# Patient Record
Sex: Male | Born: 1950 | Race: White | Hispanic: No | Marital: Single | Smoking: Never smoker
Health system: Southern US, Community
[De-identification: ages and names within clinical notes are randomized; demographics above are authoritative.]

## PROBLEM LIST (undated history)

## (undated) DIAGNOSIS — Z9289 Personal history of other medical treatment: Secondary | ICD-10-CM

## (undated) DIAGNOSIS — I00 Rheumatic fever without heart involvement: Secondary | ICD-10-CM

## (undated) DIAGNOSIS — Z9861 Coronary angioplasty status: Secondary | ICD-10-CM

## (undated) DIAGNOSIS — H9319 Tinnitus, unspecified ear: Secondary | ICD-10-CM

## (undated) DIAGNOSIS — Z720 Tobacco use: Secondary | ICD-10-CM

## (undated) DIAGNOSIS — N200 Calculus of kidney: Secondary | ICD-10-CM

## (undated) DIAGNOSIS — I2510 Atherosclerotic heart disease of native coronary artery without angina pectoris: Secondary | ICD-10-CM

## (undated) DIAGNOSIS — Z2821 Immunization not carried out because of patient refusal: Secondary | ICD-10-CM

## (undated) DIAGNOSIS — E785 Hyperlipidemia, unspecified: Secondary | ICD-10-CM

## (undated) DIAGNOSIS — I639 Cerebral infarction, unspecified: Secondary | ICD-10-CM

## (undated) DIAGNOSIS — R931 Abnormal findings on diagnostic imaging of heart and coronary circulation: Secondary | ICD-10-CM

## (undated) DIAGNOSIS — I10 Essential (primary) hypertension: Secondary | ICD-10-CM

## (undated) DIAGNOSIS — K8020 Calculus of gallbladder without cholecystitis without obstruction: Secondary | ICD-10-CM

## (undated) HISTORY — PX: ROTATOR CUFF REPAIR: SHX139

## (undated) HISTORY — DX: Personal history of other medical treatment: Z92.89

## (undated) HISTORY — DX: Calculus of gallbladder without cholecystitis without obstruction: K80.20

## (undated) HISTORY — DX: Immunization not carried out because of patient refusal: Z28.21

## (undated) HISTORY — PX: SIGMOIDOSCOPY: SUR1295

## (undated) HISTORY — DX: Hyperlipidemia, unspecified: E78.5

## (undated) HISTORY — DX: Tinnitus, unspecified ear: H93.19

## (undated) HISTORY — DX: Calculus of kidney: N20.0

## (undated) HISTORY — DX: Tobacco use: Z72.0

## (undated) HISTORY — DX: Cerebral infarction, unspecified: I63.9

## (undated) HISTORY — DX: Rheumatic fever without heart involvement: I00

---

## 1995-01-29 HISTORY — PX: OTHER SURGICAL HISTORY: SHX169

## 1995-01-29 HISTORY — PX: BRAIN SURGERY: SHX531

## 2000-10-24 ENCOUNTER — Ambulatory Visit (HOSPITAL_COMMUNITY): Admission: RE | Admit: 2000-10-24 | Discharge: 2000-10-24 | Payer: Self-pay | Admitting: Neurological Surgery

## 2000-10-24 ENCOUNTER — Encounter: Payer: Self-pay | Admitting: Neurological Surgery

## 2000-10-28 ENCOUNTER — Observation Stay (HOSPITAL_COMMUNITY): Admission: RE | Admit: 2000-10-28 | Discharge: 2000-10-29 | Payer: Self-pay | Admitting: Neurological Surgery

## 2001-02-03 ENCOUNTER — Encounter: Admission: RE | Admit: 2001-02-03 | Discharge: 2001-02-03 | Payer: Self-pay | Admitting: *Deleted

## 2001-02-12 ENCOUNTER — Encounter: Admission: RE | Admit: 2001-02-12 | Discharge: 2001-02-12 | Payer: Self-pay | Admitting: *Deleted

## 2001-11-16 ENCOUNTER — Ambulatory Visit (HOSPITAL_COMMUNITY): Admission: RE | Admit: 2001-11-16 | Discharge: 2001-11-16 | Payer: Self-pay | Admitting: *Deleted

## 2001-11-16 ENCOUNTER — Encounter: Payer: Self-pay | Admitting: Specialist

## 2002-01-28 HISTORY — PX: JOINT REPLACEMENT: SHX530

## 2005-04-10 ENCOUNTER — Encounter: Admission: RE | Admit: 2005-04-10 | Discharge: 2005-04-10 | Payer: Self-pay | Admitting: Orthopedic Surgery

## 2005-07-08 ENCOUNTER — Ambulatory Visit: Payer: Self-pay | Admitting: Family Medicine

## 2008-10-02 DIAGNOSIS — G459 Transient cerebral ischemic attack, unspecified: Secondary | ICD-10-CM | POA: Insufficient documentation

## 2008-10-02 DIAGNOSIS — R531 Weakness: Secondary | ICD-10-CM | POA: Insufficient documentation

## 2008-10-02 DIAGNOSIS — I639 Cerebral infarction, unspecified: Secondary | ICD-10-CM

## 2008-10-02 DIAGNOSIS — R718 Other abnormality of red blood cells: Secondary | ICD-10-CM | POA: Insufficient documentation

## 2008-10-02 HISTORY — DX: Cerebral infarction, unspecified: I63.9

## 2008-10-06 ENCOUNTER — Ambulatory Visit: Payer: Self-pay | Admitting: Family Medicine

## 2008-10-14 ENCOUNTER — Encounter: Admission: RE | Admit: 2008-10-14 | Discharge: 2008-12-13 | Payer: Self-pay | Admitting: Family Medicine

## 2008-10-28 ENCOUNTER — Ambulatory Visit: Payer: Self-pay | Admitting: Internal Medicine

## 2008-10-28 ENCOUNTER — Ambulatory Visit: Payer: Self-pay

## 2008-10-28 ENCOUNTER — Ambulatory Visit (HOSPITAL_COMMUNITY): Admission: RE | Admit: 2008-10-28 | Discharge: 2008-10-28 | Payer: Self-pay

## 2008-10-28 ENCOUNTER — Encounter (INDEPENDENT_AMBULATORY_CARE_PROVIDER_SITE_OTHER): Payer: Self-pay | Admitting: Neurology

## 2008-10-28 DIAGNOSIS — Z9289 Personal history of other medical treatment: Secondary | ICD-10-CM

## 2008-10-28 HISTORY — DX: Personal history of other medical treatment: Z92.89

## 2008-11-29 ENCOUNTER — Encounter: Admission: RE | Admit: 2008-11-29 | Discharge: 2009-01-10 | Payer: Self-pay | Admitting: Psychiatry

## 2009-03-08 ENCOUNTER — Ambulatory Visit: Payer: Self-pay | Admitting: Family Medicine

## 2009-03-13 ENCOUNTER — Ambulatory Visit: Payer: Self-pay | Admitting: Family Medicine

## 2009-09-15 ENCOUNTER — Emergency Department (HOSPITAL_COMMUNITY): Admission: EM | Admit: 2009-09-15 | Discharge: 2009-09-15 | Payer: Self-pay | Admitting: Internal Medicine

## 2010-04-12 LAB — URINE MICROSCOPIC-ADD ON

## 2010-04-12 LAB — DIFFERENTIAL
Basophils Relative: 0 % (ref 0–1)
Eosinophils Absolute: 0.1 10*3/uL (ref 0.0–0.7)
Lymphs Abs: 2.3 10*3/uL (ref 0.7–4.0)
Neutro Abs: 4.1 10*3/uL (ref 1.7–7.7)
Neutrophils Relative %: 57 % (ref 43–77)

## 2010-04-12 LAB — POCT I-STAT, CHEM 8
Creatinine, Ser: 1 mg/dL (ref 0.4–1.5)
HCT: 45 % (ref 39.0–52.0)
Hemoglobin: 15.3 g/dL (ref 13.0–17.0)
Potassium: 3.8 mEq/L (ref 3.5–5.1)
Sodium: 139 mEq/L (ref 135–145)

## 2010-04-12 LAB — URINALYSIS, ROUTINE W REFLEX MICROSCOPIC
Nitrite: NEGATIVE
Specific Gravity, Urine: 1.019 (ref 1.005–1.030)
Urobilinogen, UA: 0.2 mg/dL (ref 0.0–1.0)

## 2010-04-12 LAB — CBC
MCH: 28.7 pg (ref 26.0–34.0)
Platelets: 213 10*3/uL (ref 150–400)
RBC: 5.06 MIL/uL (ref 4.22–5.81)

## 2010-04-12 LAB — URINE CULTURE: Culture: NO GROWTH

## 2010-06-15 NOTE — Op Note (Signed)
[place]  Patient:    Justin [name], Justin [name] Visit Number: [id number] MRN: [id number]          Service Type: SUR Location: [id number] Attending Physician:  Jonne Ply Dictated by:   Stefani Dama, M.D. Proc. Date: 10/28/00 Admit Date:  10/28/2000                             Operative Report  PREOPERATIVE DIAGNOSIS:  Loose craniotomy site.  POSTOPERATIVE DIAGNOSIS:  Loose craniotomy site.  PROCEDURE:  Revision cranioplasty, right occipital region.  SURGEON:  Stefani Dama, M.D.  FIRST ASSISTANT:  Hewitt Shorts, M.D.  ANESTHESIA:  General endotracheal.  INDICATIONS:  The patient is a 60 year old individual who has had problems with craniotomy flap that was performed for an epidural hematoma five years ago.  He notes that the flap feels loose, and he hears a clicking sound that is rather annoying to him.  He has lived with this problem but feels that at this time he can no longer tolerate the symptoms and wishes to have it repaired.  DESCRIPTION OF PROCEDURE:  The patient was brought to the operating room supine on the stretcher.  After a smooth induction of general endotracheal anesthesia, he had his head shaved, turned to the right side to expose the left parieto-occipital region.  The area of the old incision was reopened in a vertical fashion, and the paracranium was identified.  The paracranium was stripped from the craniotomy flap, and immediately there was noted to be some prominent wires poking into the region of the galea.  Once these were freed, the wires could sequentially be removed.  The flap itself did not particularly feel loose.  There was some wiggle with a tight fascial band attaching the bone flap in place.  Nonetheless, because there was some movement, it was felt that this flap should be reaffixed, and this was done with an Osteomed plating system using four square Osteomed plates to secure the flap  in position with 4 mm screws along the perimetry.  With this, there was no movement in the flap.  The flap itself was not raised off of the craniotomy site.  It was simply fixed in place with plates.  The area was copiously irrigated with antibiotic irrigating solution, and then a meticulous closure of the galea was obtained with 2-0 Vicryl in interrupted fashion, and running nylon suture was used to close the skin.  The patient tolerated the procedure well.  Blood loss is estimated at less than 50 cc. Dictated by:   Stefani Dama, M.D. Attending Physician:  Jonne Ply DD:  10/28/00 TD:  10/28/00 Job: [id number]

## 2010-11-13 ENCOUNTER — Encounter: Payer: Self-pay | Admitting: Family Medicine

## 2011-05-02 ENCOUNTER — Encounter: Payer: Self-pay | Admitting: Gastroenterology

## 2011-05-03 ENCOUNTER — Ambulatory Visit (INDEPENDENT_AMBULATORY_CARE_PROVIDER_SITE_OTHER): Payer: 59 | Admitting: Medical

## 2011-05-03 ENCOUNTER — Encounter: Payer: Self-pay | Admitting: Medical

## 2011-05-03 DIAGNOSIS — N200 Calculus of kidney: Secondary | ICD-10-CM

## 2011-05-03 DIAGNOSIS — K8020 Calculus of gallbladder without cholecystitis without obstruction: Secondary | ICD-10-CM

## 2011-05-03 DIAGNOSIS — R0789 Other chest pain: Secondary | ICD-10-CM

## 2011-05-03 DIAGNOSIS — R109 Unspecified abdominal pain: Secondary | ICD-10-CM

## 2011-05-03 NOTE — Progress Notes (Signed)
Subjective:   HPI  Justin [name] is a 61 y.o. male who presents for hospital f/u.  He presents with his wife today. His last visit here was in 2011.  Went to [facility] ED in [place] on 05/01/11, had bad flank pain on the right that he thought was kidney stone as he has had several in the past, and went to ED for evaluation.  What was different with this visit was that he had RUQ pain as well.  He noted getting out of the car, felt pain in his right shoulder.  He notes that at the ED he had labs which were "normal", ended up having abdominal ultrasound showing gall stones, and was discharged with scripts for Percocet, Zofran, and Bactrim to help treat both kidney stones and gallbladder. Was advised to see general surgeon in [place] for gall bladder surgery.  He lives here in [place] though, and wanted to come back here for general surgery referral.  In the last 48 hours, he notes some chest discomfort in the middle of his chest, intermittent, lasting seconds to 30 minutes at times, no radiating pain, no associated sweats, nausea, numbness, SOB, jaw or arm pain.  He denies any current abdominal pain, nausea, vomiting, diarrhea, constipation, and is back to eating normally.  He has no urine decrease, frequency, urgency, blood, or pain.  Hasn't seen any visible stones pass.  In general he state that he typically doesn't trust doctors, and didn't see the need to come in today as he just needs surgery referral.  No other aggravating or relieving factors.    No other c/o.  The following portions of the patient's history were reviewed and updated as appropriate: allergies, current medications, past family history, past medical history, past social history, past surgical history and problem list.  No Known Allergies  Current Outpatient Prescriptions on File Prior to Visit  Medication Sig Dispense Refill  . aspirin 81 MG tablet Take 81 mg by mouth daily.          Past Medical History    Diagnosis Date  . Calcium oxalate renal stones   . CVA (cerebral infarction) 10/02/2008  . Rheumatic fever     age 28  . Chewing tobacco use     Past Surgical History  Procedure Date  . Rotator cuff repair     left   . Joint replacement     LEFT BICEP REPAIR  . Brain surgery 1997    HEMATOMA  . Dental implant     Family History  Problem Relation Age of Onset  . Heart disease Mother 68    MI  . Cancer Brother     COLON    History   Social History  . Marital Status: Married    Spouse Name: N/A    Number of Children: N/A  . Years of Education: N/A   Occupational History  . Not on file.   Social History Main Topics  . Smoking status: Never Smoker   . Smokeless tobacco: Current User    Types: Chew  . Alcohol Use: No  . Drug Use: No  . Sexually Active: Not on file   Other Topics Concern  . Not on file   Social History Narrative  . No narrative on file   Review of Systems ROS reviewed and was negative other than noted in HPI or above.    Objective:   Physical Exam  General appearance: alert, no distress, WD/WN HEENT: normocephalic, sclerae  anicteric, TMs pearly, nares patent, no discharge or erythema, pharynx normal Oral cavity: MMM, no lesions Neck: supple, no lymphadenopathy, no thyromegaly, no masses Heart: RRR, normal S1, S2, no murmurs Lungs: CTA bilaterally, no wheezes, rhonchi, or rales Abdomen: +bs, soft, non tender, non distended, no masses, no hepatomegaly, no splenomegaly Pulses: 2+ symmetric, upper and lower extremities, normal cap refill   Adult ECG Report  Indication: chest discomfort  Rate: 64bpm  Rhythm: normal sinus rhythm  QRS Axis: 53 degrees  PR Interval:  QRS Duration: 96ms  QTc:  Conduction Disturbances: none  Other Abnormalities: meets LVH criteria  Patient's cardiac risk factors are: advanced age (older than 23 for men, 103 for women), hypertension, male gender and smoking/ tobacco exposure.  EKG comparison:  08/2003 EKG in chart, unchanged except for LVH  Narrative Interpretation: LVH   Assessment and Plan :     Encounter Diagnoses  Name Primary?  . Abdominal pain Yes  . Cholelithiasis   . Renal stones   . Chest discomfort    I reviewed ED report from [facility].  UA showed large blood, trace protein, CBC normal, BMET normal, normal creatinine and lytes. Abdominal Ultrasound showed gallstones without murphy's sign or wall thickening.  Abdominal pain and cholelithiasis - We had a long discussion about his symptoms and findings.  He is currently asymptomatic.  I recommended additional evaluation to include HIDA scan and liver profile today vs referral to general surgery.  Since he is currently not having any pain in abdomen or flank, he wants to think about his options and call back.  I discuss possible serious complications of cholecystitis.    Renal stones - currently asymptomatic.  Advised he hydrate well, and if change in urine output, pain, or additional hematuria, to call/return.  Plan to repeat urinalysis in 2 wk to make sure no more hematuria.  Chest discomfort - EKG showing LVH today.  He notes prior echo with his evaluation for stroke in 2010.  I will try and obtain copy of the echo.  He may need further eval pending records review.  He has a hx/o of not coming in for routine f/u or preventative care.   I recommended that given his symptoms, elevated BP today, possibility of further gall bladder problems, advised he return soon for a physical and recheck on symptoms.

## 2011-05-04 ENCOUNTER — Encounter: Payer: Self-pay | Admitting: Medical

## 2011-05-04 LAB — HEPATIC FUNCTION PANEL
ALT: 13 U/L (ref 0–53)
AST: 19 U/L (ref 0–37)
Albumin: 4.1 g/dL (ref 3.5–5.2)
Alkaline Phosphatase: 74 U/L (ref 39–117)
Bilirubin, Direct: 0.1 mg/dL (ref 0.0–0.3)
Indirect Bilirubin: 0.2 mg/dL (ref 0.0–0.9)
Total Bilirubin: 0.3 mg/dL (ref 0.3–1.2)
Total Protein: 6.6 g/dL (ref 6.0–8.3)

## 2011-05-06 ENCOUNTER — Ambulatory Visit (INDEPENDENT_AMBULATORY_CARE_PROVIDER_SITE_OTHER): Payer: 59 | Admitting: Gastroenterology

## 2011-05-06 ENCOUNTER — Encounter: Payer: Self-pay | Admitting: Gastroenterology

## 2011-05-06 VITALS — BP 148/82 | HR 60 | Ht 66.0 in | Wt 159.0 lb

## 2011-05-06 DIAGNOSIS — K8020 Calculus of gallbladder without cholecystitis without obstruction: Secondary | ICD-10-CM

## 2011-05-06 DIAGNOSIS — F17200 Nicotine dependence, unspecified, uncomplicated: Secondary | ICD-10-CM

## 2011-05-06 DIAGNOSIS — R1011 Right upper quadrant pain: Secondary | ICD-10-CM

## 2011-05-06 DIAGNOSIS — Z720 Tobacco use: Secondary | ICD-10-CM

## 2011-05-06 NOTE — Patient Instructions (Signed)
Dr. Christella Hartigan will review your ultrasound report and lab tests and then likely you will be referred to [facility] to consider gallbladder resection. A copy of this information will be made available to Dr. Susann Givens.

## 2011-05-06 NOTE — Progress Notes (Signed)
HPI: This is a  very pleasant 61 year old man who goes by the name Justin [name];  hent to urgent clinic in [place] ([facility]).  Had his usual right flank kidney stone pain, severe (has had 5 of those attacks).  He had syncope from the pain even.   At same time as right flank pain he had RUQ pain as well and this was severe.  The RUQ pain was not as bad as pain in right flank pain.    He has never met a urologist.  Does not take nsaid.  Had [place], brought a copy on disc, but the report states he has "multiple gallstones in the gallbladder measuring 1 cm or less in size. No biliary dilation"  Avid fisherman   Review of systems: Pertinent positive and negative review of systems were noted in the above HPI section. Complete review of systems was performed and was otherwise normal.    Past Medical History  Diagnosis Date  . Calcium oxalate renal stones   . CVA (cerebral infarction) 10/02/2008  . Rheumatic fever     age 71  . Chewing tobacco use     Past Surgical History  Procedure Date  . Rotator cuff repair     Right  . Joint replacement     LEFT BICEP REPAIR  . Brain surgery 1997    HEMATOMA  . Dental implant     Current Outpatient Prescriptions  Medication Sig Dispense Refill  . aspirin 81 MG tablet Take 81 mg by mouth daily.          Allergies as of 05/06/2011  . (No Known Allergies)    Family History  Problem Relation Age of Onset  . Heart disease Mother 6    MI  . Colon cancer Neg Hx   . Cirrhosis Brother     History   Social History  . Marital Status: Married    Spouse Name: N/A    Number of Children: 2  . Years of Education: N/A   Occupational History  .  [employer]   Social History Main Topics  . Smoking status: Never Smoker   . Smokeless tobacco: Current User    Types: Chew  . Alcohol Use: No  . Drug Use: No  . Sexually Active: Not on file   Other Topics Concern  . Not on file   Social History Narrative   Caffeine          Physical Exam: BP 148/82  Pulse 60  Ht 5\' 6"  (1.676 m)  Wt 159 lb (72.122 kg)  BMI 25.66 kg/m2 Constitutional: generally well-appearing Psychiatric: alert and oriented x3 Eyes: extraocular movements intact Mouth: oral pharynx moist, no lesions Neck: supple no lymphadenopathy Cardiovascular: heart regular rate and rhythm Lungs: clear to auscultation bilaterally Abdomen: soft, nontender, nondistended, no obvious ascites, no peritoneal signs, normal bowel sounds Extremities: no lower extremity edema bilaterally Skin: no lesions on visible extremities    Assessment and plan: 61 y.o. male with   gallstones in gallbladder  he did have right upper quadrant pain at the same time as severe right flank pain which he has attributed to kidney stones in the past. He has in the past kidney stones. Perhaps the right upper quadrant component is from gallstones. I am awaiting his low liver tests from his [place] blood work during pain. I will likely be referring him to Gen. surgery for a laparoscopic cholecystectomy pending that review.

## 2011-05-07 ENCOUNTER — Telehealth: Payer: Self-pay | Admitting: Gastroenterology

## 2011-05-07 DIAGNOSIS — K8020 Calculus of gallbladder without cholecystitis without obstruction: Secondary | ICD-10-CM

## 2011-05-07 DIAGNOSIS — R1011 Right upper quadrant pain: Secondary | ICD-10-CM

## 2011-05-07 NOTE — Telephone Encounter (Signed)
I reviewed labs from [facility], ER visit: cbc, bmet normal.  Urine with Large amount of blood.  Please call him.  I am just not convinced he had pain from the gallstones.  I would like him to meet with a surgeon (please set up referral) to get their opinion. He clearly has stones in GB and a single episode of RUQ pain. But the pain was in the setting of right flank pain and blood in urine which is much more consistent with his usual kidney stone pains.

## 2011-05-07 NOTE — Telephone Encounter (Signed)
Left message on machine to call back  

## 2011-05-07 NOTE — Telephone Encounter (Signed)
Justin [name] will notify pt  

## 2011-05-07 NOTE — Telephone Encounter (Signed)
Message copied by Donata Duff on Tue May 07, 2011  2:04 PM ------      Message from: Marnette Burgess      Created: Tue May 07, 2011  1:59 PM      Regarding: referral       Patient scheduled to see Dr. Darnell Level on 05/20/11 @ 11:30am, arrive @ 11:00am.  If you have any questions please call [phone].            Thank You,      Elane Fritz            ----- Message -----         From: Donata Duff, CMA         Sent: 05/07/2011   8:20 AM           To: Marnette Burgess            Pt needs appt to discuss possible gallbladder removal

## 2011-05-08 NOTE — Telephone Encounter (Signed)
Pt has been notified.

## 2011-05-09 ENCOUNTER — Telehealth: Payer: Self-pay | Admitting: Gastroenterology

## 2011-05-09 NOTE — Telephone Encounter (Signed)
Ok, thanks.

## 2011-05-09 NOTE — Telephone Encounter (Signed)
The pt is having increasing pain and unable to sleep.  He says he feels like it may be kidney stones.  I advised him to call his PCP and make them aware and/or go to the ER if pain persist. He will keep his 05/20/11 appt with [facility] to follow up on his gallstones.

## 2011-05-20 ENCOUNTER — Encounter (INDEPENDENT_AMBULATORY_CARE_PROVIDER_SITE_OTHER): Payer: Self-pay | Admitting: Surgery

## 2011-05-20 ENCOUNTER — Ambulatory Visit (INDEPENDENT_AMBULATORY_CARE_PROVIDER_SITE_OTHER): Payer: 59 | Admitting: Surgery

## 2011-05-20 VITALS — BP 160/92 | HR 70 | Temp 97.2°F | Resp 18 | Ht 66.0 in | Wt 157.1 lb

## 2011-05-20 DIAGNOSIS — K8010 Calculus of gallbladder with chronic cholecystitis without obstruction: Secondary | ICD-10-CM

## 2011-05-20 DIAGNOSIS — R1011 Right upper quadrant pain: Secondary | ICD-10-CM

## 2011-05-20 NOTE — Progress Notes (Signed)
Chief Complaint  Patient presents with  . Cholelithiasis    referral from Dr. Sharlot Gowda   Referral:  Dr. Wendall Papa, [facility]  HISTORY: Patient is a 61 year old white male who presents at the request of his gastroenterologist for evaluation for cholecystectomy. He is accompanied by his wife. Patient had an episode approximately 3 weeks ago of right upper quadrant abdominal pain associated with nausea and vomiting. He was seen at an outside emergency department. Evaluation on May 01, 2011 included an abdominal ultrasound. This showed multiple gallstones. There was no thickening. There was no pericholecystic fluid. There was no ductal dilatation.  Patient has had no prior episodes of right upper quadrant abdominal pain or biliary colic. Recent laboratory studies performed by his gastroenterologist were within normal limits including liver function test. Patient denies any history of jaundice or acholic stools. He has no prior history of hepatobiliary or pancreatic disease. He has had no prior abdominal surgery.  Past Medical History  Diagnosis Date  . Calcium oxalate renal stones   . CVA (cerebral infarction) 10/02/2008  . Rheumatic fever     age 15  . Chewing tobacco use   . Stroke 10/02/2008  . Gallstones      Current Outpatient Prescriptions  Medication Sig Dispense Refill  . aspirin 81 MG tablet Take 81 mg by mouth daily.           No Known Allergies   Family History  Problem Relation Age of Onset  . Heart disease Mother 16    MI  . Colon cancer Neg Hx   . Cirrhosis Brother      History   Social History  . Marital Status: Single    Spouse Name: N/A    Number of Children: 2  . Years of Education: N/A   Occupational History  .  [employer]   Social History Main Topics  . Smoking status: Never Smoker   . Smokeless tobacco: Current User    Types: Chew  . Alcohol Use: No  . Drug Use: No  . Sexually Active: None   Other Topics Concern  . None    Social History Narrative   Caffeine       REVIEW OF SYSTEMS - PERTINENT POSITIVES ONLY: One episode of right upper quadrant abdominal pain with nausea and emesis. No history of jaundice. No history of acholic stools. No history of hepatobiliary or pancreatic disease.  EXAM: Filed Vitals:   05/20/11 1118  BP: 160/92  Pulse: 70  Temp: 97.2 F (36.2 C)  Resp: 18    HEENT: normocephalic; pupils equal and reactive; sclerae clear; dentition good; mucous membranes moist NECK:  symmetric on extension; no palpable anterior or posterior cervical lymphadenopathy; no supraclavicular masses; no tenderness CHEST: clear to auscultation bilaterally without rales, rhonchi, or wheezes CARDIAC: regular rate and rhythm without significant murmur; peripheral pulses are full ABDOMEN: soft without distension; bowel sounds present; no mass; no hepatosplenomegaly; no hernia EXT:  non-tender without edema; no deformity NEURO: no gross focal deficits; no sign of tremor   LABORATORY RESULTS: See [facility] ([facility]) for most recent results   RADIOLOGY RESULTS: See [facility] ([facility]pic) for most recent results   IMPRESSION: Cholelithiasis, history of one episode of suspected biliary colic  PLAN: I discussed all of the above issues at length with the patient and his wife. At this point in time, he would like to postpone cholecystectomy. We discussed the risk and benefits of surgery. We discussed the Hospital course to  be anticipated and his recovery. We discussed potential complications and the potential need for conversion to open surgery.  We discussed the risk of delaying surgery. Primarily this would be the risk of acute cholecystitis or choledocholithiasis. Patient and his wife understand.  Patient would like to postpone surgery until the fall over the winter of 2013. He will contact us and advance when he is ready to schedule his surgical procedure.  Velora Heckler, MD,  FACS General & Endocrine Surgery [facility]   Visit Diagnoses: 1. Cholelithiasis with cholecystitis   2. Abdominal pain, right upper quadrant     Primary Care Physician: Carollee Herter, MD, MD  GI:  Dr. Wendall Papa

## 2011-05-20 NOTE — Patient Instructions (Signed)
[facility] LAPAROSCOPIC SURGERY: POST OP INSTRUCTIONS  Always review your discharge instruction sheet given to you by the facility where your surgery was performed.  1. A prescription for pain medication may be given to you upon discharge.  Take your pain medication as prescribed, if needed.  If narcotic pain medicine is not needed, then you may take acetaminophen (Tylenol) or ibuprofen (Advil) as needed. 2. Take your usually prescribed medications unless otherwise directed. 3. If you need a refill on your pain medication, please contact your pharmacy.  They will contact our office to request authorization. Prescriptions will not be filled after 5pm or on week-ends. 4. You should follow a light diet the first few days after arrival home, such as soup and crackers, etc.  Be sure to include lots of fluids daily. 5. Most patients will experience some swelling and bruising in the area of the incisions.  Ice packs will help.  Swelling and bruising can take several days to resolve.  6. It is common to experience some constipation if taking pain medication after surgery.  Increasing fluid intake and taking a stool softener (such as Colace) will usually help or prevent this problem from occurring.  A mild laxative (Milk of Magnesia or Miralax) should be taken according to package instructions if there are no bowel movements after 48 hours. 7. Unless discharge instructions indicate otherwise, you may remove your bandages 24-48 hours after surgery, and you may shower at that time.  You may have steri-strips (small skin tapes) in place directly over the incision.  These strips should be left on the skin for 7-10 days.  If your surgeon used skin glue on the incision, you may shower in 24 hours.  The glue will flake off over the next 2-3 weeks.  Any sutures or staples will be removed at the office during your follow-up visit. 8. ACTIVITIES:  You may resume regular (light) daily activities  beginning the next day-such as daily self-care, walking, climbing stairs-gradually increasing activities as tolerated.  You may have sexual intercourse when it is comfortable.  Refrain from any heavy lifting or straining until approved by your doctor. 9. You may drive when you are no longer taking prescription pain medication, you can comfortably wear a seatbelt, and you can safely maneuver your car and apply brakes. 10. You should see your doctor in the office for a follow-up appointment approximately 2-3 weeks after your surgery.  Make sure that you call for this appointment within a day or two after you arrive home to insure a convenient appointment time.  WHEN TO CALL YOUR DOCTOR: 1. Fever over 101.0 2. Inability to urinate 3. Continued bleeding from incision. 4. Increased pain, redness, or drainage from the incision. 5. Increasing abdominal pain  The clinic staff is available to answer your questions during regular business hours.  Please don't hesitate to call and ask to speak to one of the nurses for clinical concerns.  If you have a medical emergency, go to the nearest emergency room or call 911.  A surgeon from [facility] is always on call at the hospital. [phone] ? [phone] ? FAX [phone] Web site: [email or web address]  

## 2011-08-21 ENCOUNTER — Encounter (INDEPENDENT_AMBULATORY_CARE_PROVIDER_SITE_OTHER): Payer: Self-pay | Admitting: Surgery

## 2011-08-21 ENCOUNTER — Ambulatory Visit (INDEPENDENT_AMBULATORY_CARE_PROVIDER_SITE_OTHER): Payer: 59 | Admitting: Surgery

## 2011-08-21 VITALS — BP 150/96 | HR 84 | Temp 98.2°F | Ht 66.0 in | Wt 153.4 lb

## 2011-08-21 DIAGNOSIS — R1011 Right upper quadrant pain: Secondary | ICD-10-CM

## 2011-08-21 DIAGNOSIS — K8010 Calculus of gallbladder with chronic cholecystitis without obstruction: Secondary | ICD-10-CM

## 2011-08-21 NOTE — Progress Notes (Signed)
General Surgery [facility]  Visit Diagnoses: 1. Cholelithiasis with cholecystitis   2. Abdominal pain, chronic, right upper quadrant     HISTORY: Patient is a 61 year old white male who was evaluated in April 2013 for symptomatic cholelithiasis. Patient returns today with his wife. He has had recurrent episodes of epigastric abdominal pain radiating to the back. The timing of these is unpredictable. He denies nausea or vomiting. He denies fever or chills. He denies jaundice or acholic stools. Patient has known gallstones. He now presents to discuss cholecystectomy.  PERTINENT REVIEW OF SYSTEMS: Note John does. No acholic stools. No fever. No chills.  EXAM: HEENT: normocephalic; pupils equal and reactive; sclerae clear; dentition good; mucous membranes moist NECK:  symmetric on extension; no palpable anterior or posterior cervical lymphadenopathy; no supraclavicular masses; no tenderness CHEST: clear to auscultation bilaterally without rales, rhonchi, or wheezes CARDIAC: regular rate and rhythm without significant murmur; peripheral pulses are full EXT:  non-tender without edema; no deformity NEURO: no gross focal deficits; no sign of tremor   IMPRESSION: Symptomatic cholelithiasis  PLAN: The patient and I and his wife discussed all of the above findings again. We discussed laparoscopic cholecystectomy at length. We discussed the hospital stay to be anticipated in his recovery and return to activity. They understand and wish to proceed.  The risks and benefits of the procedure have been discussed at length with the patient.  The patient understands the proposed procedure, potential alternative treatments, and the course of recovery to be expected.  All of the patient's questions have been answered at this time.  The patient wishes to proceed with surgery.  Velora Heckler, MD, [facility] Office: [phone]

## 2011-08-21 NOTE — Patient Instructions (Signed)
[facility] LAPAROSCOPIC SURGERY: POST OP INSTRUCTIONS  Always review your discharge instruction sheet given to you by the facility where your surgery was performed.  1. A prescription for pain medication may be given to you upon discharge.  Take your pain medication as prescribed.  If narcotic pain medicine is not needed, then you may take acetaminophen (Tylenol) or ibuprofen (Advil) as needed. 2. Take your usually prescribed medications unless otherwise directed. 3. If you need a refill on your pain medication, please contact your pharmacy.  They will contact our office to request authorization. Prescriptions will not be filled after 5 P.M. or on weekends. 4. You should follow a light diet the first few days after arrival home, such as soup and crackers or toast.  Be sure to include plenty of fluids daily. 5. Most patients will experience some swelling and bruising in the area of the incisions.  Ice packs will help.  Swelling and bruising can take several days to resolve.  6. It is common to experience some constipation if taking pain medication after surgery.  Increasing fluid intake and taking a stool softener (such as Colace) will usually help or prevent this problem from occurring.  A mild laxative (Milk of Magnesia or Miralax) should be taken according to package instructions if there are no bowel movements after 48 hours. 7. Unless discharge instructions indicate otherwise, you may remove your bandages 24-48 hours after surgery, and you may shower at that time.  You may have steri-strips (small skin tapes) in place directly over the incision.  These strips should be left on the skin for 7-10 days.  If your surgeon used skin glue on the incision, you may shower in 24 hours.  The glue will flake off over the next 2-3 weeks.  Any sutures or staples will be removed at the office during your follow-up visit. 8. ACTIVITIES:  You may resume regular (light) daily activities beginning  the next day-such as daily self-care, walking, climbing stairs-gradually increasing activities as tolerated.  You may have sexual intercourse when it is comfortable.  Refrain from any heavy lifting or straining until approved by your doctor. 9. You may drive when you are no longer taking prescription pain medication, you can comfortably wear a seatbelt, and you can safely maneuver your car and apply brakes. 10. You should see your doctor in the office for a follow-up appointment approximately 2-3 weeks after your surgery.  Make sure that you call for this appointment within a day or two after you arrive home to insure a convenient appointment time.  WHEN TO CALL YOUR DOCTOR: 1. Fever over 101.0 2. Inability to urinate 3. Continued bleeding from incision 4. Increased pain, redness, or drainage from the incision 5. Increasing abdominal pain  The clinic staff is available to answer your questions during regular business hours.  Please don't hesitate to call and ask to speak to one of the nurses for clinical concerns.  If you have a medical emergency, go to the nearest emergency room or call 911.  A surgeon from [facility] is always on call for the hospital. [phone] ? [phone] ? FAX [phone] Web site: [email or web address]  

## 2011-08-23 ENCOUNTER — Encounter (HOSPITAL_COMMUNITY): Payer: Self-pay | Admitting: Pharmacy Technician

## 2011-09-03 NOTE — Patient Instructions (Signed)
19 Justin [name]  09/03/2011   Your procedure is scheduled on:  09/06/11 1145am-115pm  Report to [facility] at 0915 AM.  Call this number if you have problems the morning of surgery: [phone]   Remember:   Do not eat food:After Midnight.  May have clear liquids:until Midnight .  Marland Kitchen  Take these medicines the morning of surgery with A SIP OF WATER:    Do not wear jewelry, make-up or nail polish.  Do not wear lotions, powders, or perfumes. .   Men may shave face and neck.  Do not bring valuables to the hospital.  Contacts, dentures or bridgework may not be worn into surgery.  Leave suitcase in the car. After surgery it may be brought to your room.  For patients admitted to the hospital, checkout time is 11:00 AM the day of discharge.       Special Instructions: CHG Shower Use Special Wash: 1/2 bottle night before surgery and 1/2 bottle morning of surgery. shower chin to toes with CHG.  Wash face and private parts with regular soap.    Please read over the following fact sheets that you were given: MRSA Information, coughing and deep breathing exercises, leg exercises

## 2011-09-04 ENCOUNTER — Encounter (HOSPITAL_COMMUNITY)
Admission: RE | Admit: 2011-09-04 | Discharge: 2011-09-04 | Disposition: A | Discharge status: Home / Self Care | Payer: 59 | Source: Ambulatory Visit | Attending: Family Medicine | Admitting: Family Medicine

## 2011-09-04 ENCOUNTER — Encounter (HOSPITAL_COMMUNITY): Payer: Self-pay

## 2011-09-04 HISTORY — DX: Essential (primary) hypertension: I10

## 2011-09-04 LAB — CBC
Hemoglobin: 14.6 g/dL (ref 13.0–17.0)
MCH: 28.8 pg (ref 26.0–34.0)
MCV: 83.6 fL (ref 78.0–100.0)
RBC: 5.07 MIL/uL (ref 4.22–5.81)

## 2011-09-04 LAB — BASIC METABOLIC PANEL
BUN: 15 mg/dL (ref 6–23)
Chloride: 106 mEq/L (ref 96–112)
GFR calc Af Amer: >90 mL/min (ref >90)
Potassium: 4.3 mEq/L (ref 3.5–5.1)

## 2011-09-04 LAB — SURGICAL PCR SCREEN: MRSA, PCR: NEGATIVE

## 2011-09-04 NOTE — Progress Notes (Signed)
Quick Note:  These results are acceptable for scheduled surgery.  Julicia Krieger M. Medina Degraffenreid, MD, FACS [facility] Office: [phone]   ______ 

## 2011-09-06 ENCOUNTER — Encounter (HOSPITAL_COMMUNITY)
Admission: RE | Disposition: A | Discharge status: Home / Self Care | Payer: Self-pay | Source: Ambulatory Visit | Attending: Surgery

## 2011-09-06 ENCOUNTER — Encounter (HOSPITAL_COMMUNITY): Payer: Self-pay | Admitting: Anesthesiology

## 2011-09-06 ENCOUNTER — Ambulatory Visit (HOSPITAL_COMMUNITY)
Admission: RE | Admit: 2011-09-06 | Discharge: 2011-09-06 | Disposition: A | Discharge status: Home / Self Care | Payer: 59 | Source: Ambulatory Visit | Attending: Surgery | Admitting: Surgery

## 2011-09-06 ENCOUNTER — Encounter (HOSPITAL_COMMUNITY): Payer: Self-pay | Admitting: *Deleted

## 2011-09-06 ENCOUNTER — Ambulatory Visit (HOSPITAL_COMMUNITY): Payer: 59

## 2011-09-06 ENCOUNTER — Ambulatory Visit (HOSPITAL_COMMUNITY): Payer: 59 | Admitting: Anesthesiology

## 2011-09-06 DIAGNOSIS — Z01812 Encounter for preprocedural laboratory examination: Secondary | ICD-10-CM | POA: Insufficient documentation

## 2011-09-06 DIAGNOSIS — Z01810 Encounter for preprocedural cardiovascular examination: Secondary | ICD-10-CM | POA: Insufficient documentation

## 2011-09-06 DIAGNOSIS — K8010 Calculus of gallbladder with chronic cholecystitis without obstruction: Secondary | ICD-10-CM

## 2011-09-06 DIAGNOSIS — G8929 Other chronic pain: Secondary | ICD-10-CM | POA: Insufficient documentation

## 2011-09-06 DIAGNOSIS — R1011 Right upper quadrant pain: Secondary | ICD-10-CM | POA: Insufficient documentation

## 2011-09-06 HISTORY — PX: CHOLECYSTECTOMY: SHX55

## 2011-09-06 SURGERY — LAPAROSCOPIC CHOLECYSTECTOMY WITH INTRAOPERATIVE CHOLANGIOGRAM
Anesthesia: General | Site: Abdomen | Wound class: Clean Contaminated

## 2011-09-06 MED ORDER — ACETAMINOPHEN 325 MG PO TABS
650.0000 mg | ORAL_TABLET | ORAL | Status: DC | PRN
  Administered 2011-09-06: 650 mg via ORAL
  Filled 2011-09-06: qty 2

## 2011-09-06 MED ORDER — FENTANYL CITRATE 0.05 MG/ML IJ SOLN
INTRAMUSCULAR | Status: DC | PRN
  Administered 2011-09-06: 150 ug via INTRAVENOUS
  Administered 2011-09-06: 50 ug via INTRAVENOUS

## 2011-09-06 MED ORDER — CEFAZOLIN SODIUM-DEXTROSE 2-3 GM-% IV SOLR
INTRAVENOUS | Status: AC
  Filled 2011-09-06: qty 50

## 2011-09-06 MED ORDER — GLYCOPYRROLATE 0.2 MG/ML IJ SOLN
INTRAMUSCULAR | Status: DC | PRN
  Administered 2011-09-06: .6 mg via INTRAVENOUS

## 2011-09-06 MED ORDER — PROMETHAZINE HCL 25 MG/ML IJ SOLN: 6.2500 mg | INTRAMUSCULAR | Status: DC | PRN

## 2011-09-06 MED ORDER — LIDOCAINE HCL (CARDIAC) 20 MG/ML IV SOLN
INTRAVENOUS | Status: DC | PRN
  Administered 2011-09-06: 100 mg via INTRAVENOUS

## 2011-09-06 MED ORDER — MEPERIDINE HCL 50 MG/ML IJ SOLN: 6.2500 mg | INTRAMUSCULAR | Status: DC | PRN

## 2011-09-06 MED ORDER — ONDANSETRON HCL 4 MG PO TABS: 4.0000 mg | ORAL_TABLET | Freq: Four times a day (QID) | ORAL | Status: DC | PRN

## 2011-09-06 MED ORDER — HYDROCODONE-ACETAMINOPHEN 5-325 MG PO TABS: 1.0000 | ORAL_TABLET | ORAL | Status: AC | PRN

## 2011-09-06 MED ORDER — LACTATED RINGERS IV SOLN: INTRAVENOUS | Status: DC

## 2011-09-06 MED ORDER — DIPHENHYDRAMINE HCL 50 MG/ML IJ SOLN
25.0000 mg | Freq: Once | INTRAMUSCULAR | Status: AC
  Administered 2011-09-06: 25 mg via INTRAVENOUS

## 2011-09-06 MED ORDER — LACTATED RINGERS IV SOLN
INTRAVENOUS | Status: DC | PRN
  Administered 2011-09-06: 1000 mL via INTRAVENOUS

## 2011-09-06 MED ORDER — IOHEXOL 300 MG/ML  SOLN
INTRAMUSCULAR | Status: AC
  Filled 2011-09-06: qty 1

## 2011-09-06 MED ORDER — CEFAZOLIN SODIUM-DEXTROSE 2-3 GM-% IV SOLR
2.0000 g | INTRAVENOUS | Status: AC
  Administered 2011-09-06: 2 g via INTRAVENOUS

## 2011-09-06 MED ORDER — LACTATED RINGERS IV SOLN
INTRAVENOUS | Status: DC
Start: 2011-09-06 — End: 2011-09-06

## 2011-09-06 MED ORDER — ONDANSETRON HCL 4 MG/2ML IJ SOLN
INTRAMUSCULAR | Status: DC | PRN
  Administered 2011-09-06: 4 mg via INTRAVENOUS

## 2011-09-06 MED ORDER — NEOSTIGMINE METHYLSULFATE 1 MG/ML IJ SOLN
INTRAMUSCULAR | Status: DC | PRN
  Administered 2011-09-06: 4 mg via INTRAVENOUS

## 2011-09-06 MED ORDER — HYDROMORPHONE HCL PF 1 MG/ML IJ SOLN: 0.2500 mg | INTRAMUSCULAR | Status: DC | PRN

## 2011-09-06 MED ORDER — BUPIVACAINE HCL (PF) 0.5 % IJ SOLN
INTRAMUSCULAR | Status: AC
  Filled 2011-09-06: qty 30

## 2011-09-06 MED ORDER — DIPHENHYDRAMINE HCL 50 MG/ML IJ SOLN
INTRAMUSCULAR | Status: AC
  Filled 2011-09-06: qty 1

## 2011-09-06 MED ORDER — LACTATED RINGERS IV SOLN
INTRAVENOUS | Status: DC
  Administered 2011-09-06: 1000 mL via INTRAVENOUS

## 2011-09-06 MED ORDER — ROCURONIUM BROMIDE 100 MG/10ML IV SOLN
INTRAVENOUS | Status: DC | PRN
  Administered 2011-09-06: 40 mg via INTRAVENOUS

## 2011-09-06 MED ORDER — IOHEXOL 300 MG/ML  SOLN
INTRAMUSCULAR | Status: DC | PRN
  Administered 2011-09-06: 10 mL via INTRAVENOUS

## 2011-09-06 MED ORDER — ACETAMINOPHEN 10 MG/ML IV SOLN
INTRAVENOUS | Status: DC | PRN
  Administered 2011-09-06: 1000 mg via INTRAVENOUS

## 2011-09-06 MED ORDER — FENTANYL CITRATE 0.05 MG/ML IJ SOLN: 25.0000 ug | INTRAMUSCULAR | Status: DC | PRN

## 2011-09-06 MED ORDER — HYDROMORPHONE HCL PF 1 MG/ML IJ SOLN: 1.0000 mg | INTRAMUSCULAR | Status: DC | PRN

## 2011-09-06 MED ORDER — ONDANSETRON HCL 4 MG/2ML IJ SOLN: 4.0000 mg | Freq: Four times a day (QID) | INTRAMUSCULAR | Status: DC | PRN

## 2011-09-06 MED ORDER — PROPOFOL 10 MG/ML IV EMUL
INTRAVENOUS | Status: DC | PRN
  Administered 2011-09-06: 150 mg via INTRAVENOUS

## 2011-09-06 MED ORDER — BUPIVACAINE-EPINEPHRINE 0.5% -1:200000 IJ SOLN
INTRAMUSCULAR | Status: DC | PRN
  Administered 2011-09-06: 17 mL

## 2011-09-06 MED ORDER — KCL IN DEXTROSE-NACL 20-5-0.45 MEQ/L-%-% IV SOLN
INTRAVENOUS | Status: DC
  Filled 2011-09-06 (×2): qty 1000

## 2011-09-06 MED ORDER — ACETAMINOPHEN 10 MG/ML IV SOLN
INTRAVENOUS | Status: AC
  Filled 2011-09-06: qty 100

## 2011-09-06 MED ORDER — HYDROCODONE-ACETAMINOPHEN 5-325 MG PO TABS: 1.0000 | ORAL_TABLET | ORAL | Status: DC | PRN

## 2011-09-06 SURGICAL SUPPLY — 39 items
APL SKNCLS STERI-STRIP NONHPOA (GAUZE/BANDAGES/DRESSINGS) ×1
APPLIER CLIP ROT 10 11.4 M/L (STAPLE) ×2
APR CLP MED LRG 11.4X10 (STAPLE) ×1
BAG SPEC RTRVL LRG 6X4 10 (ENDOMECHANICALS) ×1
BENZOIN TINCTURE PRP APPL 2/3 (GAUZE/BANDAGES/DRESSINGS) ×2 IMPLANT
CABLE HIGH FREQUENCY MONO STRZ (ELECTRODE) ×2 IMPLANT
CANISTER SUCTION 2500CC (MISCELLANEOUS) ×2 IMPLANT
CHLORAPREP W/TINT 26ML (MISCELLANEOUS) ×2 IMPLANT
CLIP APPLIE ROT 10 11.4 M/L (STAPLE) ×1 IMPLANT
CLOTH BEACON ORANGE TIMEOUT ST (SAFETY) ×2 IMPLANT
COVER MAYO STAND STRL (DRAPES) ×2 IMPLANT
DECANTER SPIKE VIAL GLASS SM (MISCELLANEOUS) ×2 IMPLANT
DRAPE C-ARM 42X72 X-RAY (DRAPES) ×2 IMPLANT
DRAPE LAPAROSCOPIC ABDOMINAL (DRAPES) ×2 IMPLANT
DRAPE UTILITY 15X26 (DRAPE) ×2 IMPLANT
ELECT REM PT RETURN 9FT ADLT (ELECTROSURGICAL) ×2
ELECTRODE REM PT RTRN 9FT ADLT (ELECTROSURGICAL) ×1 IMPLANT
GLOVE BIOGEL PI IND STRL 7.0 (GLOVE) ×1 IMPLANT
GLOVE BIOGEL PI INDICATOR 7.0 (GLOVE) ×1
GLOVE SURG ORTHO 8.0 STRL STRW (GLOVE) ×2 IMPLANT
GOWN STRL NON-REIN LRG LVL3 (GOWN DISPOSABLE) ×2 IMPLANT
GOWN STRL REIN XL XLG (GOWN DISPOSABLE) ×4 IMPLANT
HEMOSTAT SURGICEL 4X8 (HEMOSTASIS) IMPLANT
KIT BASIN OR (CUSTOM PROCEDURE TRAY) ×2 IMPLANT
NS IRRIG 1000ML POUR BTL (IV SOLUTION) ×2 IMPLANT
POUCH SPECIMEN RETRIEVAL 10MM (ENDOMECHANICALS) ×2 IMPLANT
SCISSORS LAP 5X35 DISP (ENDOMECHANICALS) IMPLANT
SET CHOLANGIOGRAPH MIX (MISCELLANEOUS) ×2 IMPLANT
SET IRRIG TUBING LAPAROSCOPIC (IRRIGATION / IRRIGATOR) ×2 IMPLANT
SLEEVE Z-THREAD 5X100MM (TROCAR) ×2 IMPLANT
SOLUTION ANTI FOG 6CC (MISCELLANEOUS) ×2 IMPLANT
STRIP CLOSURE SKIN 1/2X4 (GAUZE/BANDAGES/DRESSINGS) ×2 IMPLANT
SUT MNCRL AB 4-0 PS2 18 (SUTURE) ×2 IMPLANT
TOWEL OR 17X26 10 PK STRL BLUE (TOWEL DISPOSABLE) ×6 IMPLANT
TRAY LAP CHOLE (CUSTOM PROCEDURE TRAY) ×2 IMPLANT
TROCAR XCEL BLUNT TIP 100MML (ENDOMECHANICALS) ×2 IMPLANT
TROCAR Z-THREAD FIOS 11X100 BL (TROCAR) ×2 IMPLANT
TROCAR Z-THREAD FIOS 5X100MM (TROCAR) ×4 IMPLANT
TUBING INSUFFLATION 10FT LAP (TUBING) ×2 IMPLANT

## 2011-09-06 NOTE — H&P (View-Only) (Signed)
General Surgery - [facility]  Visit Diagnoses: 1. Cholelithiasis with cholecystitis   2. Abdominal pain, chronic, right upper quadrant     HISTORY: Patient is a 61-year-old white male who was evaluated in April 2013 for symptomatic cholelithiasis. Patient returns today with his wife. He has had recurrent episodes of epigastric abdominal pain radiating to the back. The timing of these is unpredictable. He denies nausea or vomiting. He denies fever or chills. He denies jaundice or acholic stools. Patient has known gallstones. He now presents to discuss cholecystectomy.  PERTINENT REVIEW OF SYSTEMS: Note John does. No acholic stools. No fever. No chills.  EXAM: HEENT: normocephalic; pupils equal and reactive; sclerae clear; dentition good; mucous membranes moist NECK:  symmetric on extension; no palpable anterior or posterior cervical lymphadenopathy; no supraclavicular masses; no tenderness CHEST: clear to auscultation bilaterally without rales, rhonchi, or wheezes CARDIAC: regular rate and rhythm without significant murmur; peripheral pulses are full EXT:  non-tender without edema; no deformity NEURO: no gross focal deficits; no sign of tremor   IMPRESSION: Symptomatic cholelithiasis  PLAN: The patient and I and his wife discussed all of the above findings again. We discussed laparoscopic cholecystectomy at length. We discussed the hospital stay to be anticipated in his recovery and return to activity. They understand and wish to proceed.  The risks and benefits of the procedure have been discussed at length with the patient.  The patient understands the proposed procedure, potential alternative treatments, and the course of recovery to be expected.  All of the patient's questions have been answered at this time.  The patient wishes to proceed with surgery.  Geneieve Duell M. Clydell Sposito, MD, FACS [facility] Office: [phone]    

## 2011-09-06 NOTE — Interval H&P Note (Signed)
History and Physical Interval Note:  09/06/2011 11:19 AM  Justin [name]  has presented today for surgery, with the diagnosis of gallstones.  The various methods of treatment have been discussed with the patient and family. After consideration of risks, benefits and other options for treatment, the patient has consented to    Procedure(s) (LRB): LAPAROSCOPIC CHOLECYSTECTOMY WITH INTRAOPERATIVE CHOLANGIOGRAM (N/A) as a surgical intervention .    The patient's history has been reviewed, patient examined, no change in status, stable for surgery.  I have reviewed the patient's chart and labs.  Questions were answered to the patient's satisfaction.    Velora Heckler, MD, [facility] Office: [phone]   [place] Judie Petit

## 2011-09-06 NOTE — Op Note (Signed)
Laparoscopic Cholecystectomy with IOC Procedure Note  Pre-operative Diagnosis: Calculus of bile duct with other cholecystitis, without mention of obstruction  Post-operative Diagnosis: Same  Surgeon:  Velora Heckler, MD, FACS  Assistant:  Gaynelle Adu, MD, FACS   Anesthesia:  General  Indications: This patient presents with symptomatic gallbladder disease and will undergo laparoscopic cholecystectomy.  Procedure Details  The patient was seen in the pre-op holding area. The risks, benefits, complications, treatment options, and expected outcomes have been discussed with the patient. The patient and/or family agreed with the proposed plan and signed the informed consent form.  The patient was taken to Operating Room, identified as AMEN STASZAK and the procedure verified as Laparoscopic Cholecystectomy with Intraoperative Cholangiogram. A "time out" was completed and the above information confirmed.  Prior to the induction of general anesthesia, antibiotic prophylaxis was administered. General endotracheal anesthesia was then administered and tolerated well. After the induction, the abdomen was prepped in the usual aseptic fashion. The patient was positioned in the supine position.  An incision was made in the skin near the umbilicus. The midline fascia was incised and the peritoneal cavity entered and the Hasson canula was introduced under direct vision. Hasson canula was secured with a pursestring 0-Vicryl suture. Pneumoperitoneum was then established with carbon dioxide and tolerated well without any adverse changes in the patient's vital signs. Additional trocars were introduced under direct vision along the right costal margin in the midline, mid-clavicular line, and anterior axillary line.  The gallbladder was identified and the fundus grasped and retracted cephalad. Adhesions were taken down bluntly and with the electrocautery as needed, taking care not to injure any adjacent  structures. The infundibulum was grasped and retracted laterally, exposing the peritoneum overlying the triangle of Calot. This was incised and structures exposed in a blunt fashion. The cystic duct was clearly identified and bluntly dissected circumferentially.  An incision was made in the cystic duct and the cholangiogram catheter introduced. The catheter was secured using an ligaclip.  Real-time cholangiography was performed using the C-arm.  There was rapid filling of a normal caliber common bile duct.  There was reflux of contrast into the left and right hepatic ductal systems.  There was free flow distally into the duodenum without filling defect or obstruction.  Catheter was removed from the peritoneal cavity.  The cystic duct was then triply ligated with surgical clips on the patient side and singly clipped on the gallbladder side and divided. The cystic artery was identified, dissected circumferentially, ligated with ligaclips, and divided.   The gallbladder was dissected from the liver bed with the electrocautery used for hemostasis. The gallbladder was completely removed and placed into an endocatch bag. The right upper quadrant was irrigated and inspected. Hemostasis was achieved with the electrocautery. Warm saline irrigation was utilized and was repeatedly aspirated until clear.  Pneumoperitoneum was released after viewing removal of the trocars with good hemostasis. The umbilical wound was irrigated and the fascia was then closed with the pursestring suture.  The skin was then closed with 4-0 Monocril subcuticular sutures and a sterile dressing was applied.  Instrument, sponge, and needle counts were correct at the conclusion of the case.  Findings: Cholecystitis with Cholelithiasis  Estimated Blood Loss: Minimal         Drains: none         Specimens: Gallbladder to pathology       Complications: None; patient tolerated the procedure well.         Disposition: PACU -  hemodynamically stable.         Condition: stable  Velora Heckler, MD, [facility] Office: [phone]

## 2011-09-06 NOTE — Anesthesia Postprocedure Evaluation (Signed)
  Anesthesia Post-op Note  Patient: Justin [name]  Procedure(s) Performed: Procedure(s) (LRB): LAPAROSCOPIC CHOLECYSTECTOMY WITH INTRAOPERATIVE CHOLANGIOGRAM (N/A)  Patient Location: PACU  Anesthesia Type: General  Level of Consciousness: awake and alert   Airway and Oxygen Therapy: Patient Spontanous Breathing  Post-op Pain: mild  Post-op Assessment: Post-op Vital signs reviewed, Patient's Cardiovascular Status Stable, Respiratory Function Stable, Patent Airway and No signs of Nausea or vomiting  Post-op Vital Signs: stable  Complications: No apparent anesthesia complications

## 2011-09-06 NOTE — Progress Notes (Signed)
Patient express readiness for d/c to home. Has voided on own x2, tolerated regular diet without n/v, pain in control with oral med. & has ambulated in hallway.

## 2011-09-06 NOTE — Discharge Summary (Signed)
Physician Discharge Summary [facility]  Patient ID: Justin [name] MRN: [id number] DOB/AGE: [birth year] 62 y.o.  Admit date: 09/06/2011  Discharge date: 09/06/2011  Admission Diagnoses:  Chronic cholecystitis, cholelithiasis  Discharge Diagnoses:  Active Problems:  * No active hospital problems. *    Discharged Condition: good  Hospital Course: patient admitted after lap chole with IOC for observation.  Tolerated diet.  Ambulated.  Voiding.  Prepared for discharge home on evening following surgery.  Consults: None  Significant Diagnostic Studies: none  Treatments: surgery: lap chole with IOC  Discharge Exam: Blood pressure 130/70, pulse 50, temperature 98.6 F (37 C), temperature source Oral, resp. rate 18, height 5\' 6"  (1.676 m), weight 155 lb 12.8 oz (70.67 kg), SpO2 99.00%. Abd - soft without distension, wounds clear and dry  Disposition: Home with family  Discharge Orders    Future Appointments: Provider: Department: Dept Phone: Center:   09/25/2011 10:15 AM Velora Heckler, MD [facility] [phone] None     Future Orders Please Complete By Expires   Diet - low sodium heart healthy      Increase activity slowly      Discharge instructions      Comments:   [facility] LAPAROSCOPIC SURGERY: POST OP INSTRUCTIONS  Always review your discharge instruction sheet given to you by the facility where your surgery was performed.  A prescription for pain medication may be given to you upon discharge.  Take your pain medication as prescribed.  If narcotic pain medicine is not needed, then you may take acetaminophen (Tylenol) or ibuprofen (Advil) as needed. Take your usually prescribed medications unless otherwise directed. If you need a refill on your pain medication, please contact your pharmacy.  They will contact our office to request authorization. Prescriptions will not be filled after 5 P.M. or on weekends. You should follow a  light diet the first few days after arrival home, such as soup and crackers or toast.  Be sure to include plenty of fluids daily. Most patients will experience some swelling and bruising in the area of the incisions.  Ice packs will help.  Swelling and bruising can take several days to resolve.  It is common to experience some constipation if taking pain medication after surgery.  Increasing fluid intake and taking a stool softener (such as Colace) will usually help or prevent this problem from occurring.  A mild laxative (Milk of Magnesia or Miralax) should be taken according to package instructions if there are no bowel movements after 48 hours. Unless discharge instructions indicate otherwise, you may remove your bandages 24-48 hours after surgery, and you may shower at that time.  You may have steri-strips (small skin tapes) in place directly over the incision.  These strips should be left on the skin for 7-10 days.  If your surgeon used skin glue on the incision, you may shower in 24 hours.  The glue will flake off over the next 2-3 weeks.  Any sutures or staples will be removed at the office during your follow-up visit. ACTIVITIES:  You may resume regular (light) daily activities beginning the next day-such as daily self-care, walking, climbing stairs-gradually increasing activities as tolerated.  You may have sexual intercourse when it is comfortable.  Refrain from any heavy lifting or straining until approved by your doctor. You may drive when you are no longer taking prescription pain medication, you can comfortably wear a seatbelt, and you can safely maneuver your car and apply brakes.  You should see your doctor in the office for a follow-up appointment approximately 2-3 weeks after your surgery.  Make sure that you call for this appointment within a day or two after you arrive home to insure a convenient appointment time.  WHEN TO CALL YOUR DOCTOR: Fever over 101.0 Inability to  urinate Continued bleeding from incision Increased pain, redness, or drainage from the incision Increasing abdominal pain  The clinic staff is available to answer your questions during regular business hours.  Please don't hesitate to call and ask to speak to one of the nurses for clinical concerns.  If you have a medical emergency, go to the nearest emergency room or call 911.  A surgeon from [facility] is always on call for the hospital. [phone] ? [phone] ? FAX [phone] Web site: [email or web address]   Remove dressing in 48 hours        Medication List  As of 09/06/2011  7:11 PM   TAKE these medications         aspirin 81 MG tablet   Take 81 mg by mouth daily.      HYDROcodone-acetaminophen 5-325 MG per tablet   Commonly known as: NORCO/VICODIN   Take 1-2 tablets by mouth every 4 (four) hours as needed.           Velora Heckler, MD, [facility] Office: [phone]    Signed: Velora Heckler 09/06/2011, 7:11 PM

## 2011-09-06 NOTE — Transfer of Care (Signed)
Immediate Anesthesia Transfer of Care Note  Patient: Justin [name]  Procedure(s) Performed: Procedure(s) (LRB): LAPAROSCOPIC CHOLECYSTECTOMY WITH INTRAOPERATIVE CHOLANGIOGRAM (N/A)  Patient Location: PACU  Anesthesia Type: General  Level of Consciousness: awake and sedated  Airway & Oxygen Therapy: Patient Spontanous Breathing and Patient connected to face mask oxygen  Post-op Assessment: Report given to PACU RN, Post -op Vital signs reviewed and stable and Patient moving all extremities  Post vital signs: Reviewed and stable  Complications: No apparent anesthesia complications

## 2011-09-06 NOTE — Anesthesia Preprocedure Evaluation (Signed)
Anesthesia Evaluation  Patient identified by MRN, date of birth, ID band Patient awake    Reviewed: Allergy & Precautions, H&P , NPO status , Patient's Chart, lab work & pertinent test results  Airway Mallampati: II TM Distance: >3 FB Neck ROM: Full    Dental No notable dental hx.    Pulmonary neg pulmonary ROS,  breath sounds clear to auscultation  Pulmonary exam normal       Cardiovascular hypertension, negative cardio ROS  Rhythm:Regular Rate:Normal     Neuro/Psych CVA, No Residual Symptoms negative neurological ROS  negative psych ROS   GI/Hepatic negative GI ROS, Neg liver ROS,   Endo/Other  negative endocrine ROS  Renal/GU negative Renal ROS  negative genitourinary   Musculoskeletal negative musculoskeletal ROS (+)   Abdominal   Peds negative pediatric ROS (+)  Hematology negative hematology ROS (+)   Anesthesia Other Findings   Reproductive/Obstetrics negative OB ROS                           Anesthesia Physical Anesthesia Plan  ASA: II  Anesthesia Plan: General   Post-op Pain Management:    Induction: Intravenous  Airway Management Planned: Oral ETT  Additional Equipment:   Intra-op Plan:   Post-operative Plan: Extubation in OR  Informed Consent: I have reviewed the patients History and Physical, chart, labs and discussed the procedure including the risks, benefits and alternatives for the proposed anesthesia with the patient or authorized representative who has indicated his/her understanding and acceptance.   Dental advisory given  Plan Discussed with: CRNA  Anesthesia Plan Comments:         Anesthesia Quick Evaluation

## 2011-09-09 ENCOUNTER — Encounter (HOSPITAL_COMMUNITY): Payer: Self-pay | Admitting: Surgery

## 2011-09-25 ENCOUNTER — Ambulatory Visit (INDEPENDENT_AMBULATORY_CARE_PROVIDER_SITE_OTHER): Payer: 59 | Admitting: Surgery

## 2011-09-25 ENCOUNTER — Encounter (INDEPENDENT_AMBULATORY_CARE_PROVIDER_SITE_OTHER): Payer: Self-pay | Admitting: Surgery

## 2011-09-25 VITALS — BP 142/102 | HR 68 | Temp 98.0°F | Resp 18 | Ht 66.0 in | Wt 152.2 lb

## 2011-09-25 DIAGNOSIS — K8010 Calculus of gallbladder with chronic cholecystitis without obstruction: Secondary | ICD-10-CM

## 2011-09-25 DIAGNOSIS — R1011 Right upper quadrant pain: Secondary | ICD-10-CM

## 2011-09-25 NOTE — Progress Notes (Signed)
General Surgery [facility]  Visit Diagnoses: 1. Cholelithiasis with cholecystitis   2. Abdominal pain, chronic, right upper quadrant     HISTORY: The patient returns for postoperative visit having undergone laparoscopic cholecystectomy. Final pathology shows chronic cholecystitis and cholelithiasis. Patient has resumed her regular diet. He is anxious to resume all of his normal activities.  EXAM: surgical wounds are healing nicely. Minimal ecchymosis on the right subcostal incisions  IMPRESSION: Status post laparoscopic cholecystectomy  PLAN: Patient and I reviewed the pathology results. He is released to resume all normal activity without restriction. He will return to see me as needed.  Velora Heckler, MD, FACS General & Endocrine Surgery [facility]

## 2011-09-25 NOTE — Patient Instructions (Signed)
  COCOA BUTTER & VITAMIN E CREAM  ([employer] or other brand)  Apply cocoa butter/vitamin E cream to your incision 2 - 3 times daily.  Massage cream into incision for one minute with each application.  Use sunscreen (50 SPF or higher) for first 6 months after surgery if area is exposed to sun.  You may substitute Mederma or other scar reducing creams as desired.   

## 2013-09-28 DIAGNOSIS — Z2821 Immunization not carried out because of patient refusal: Secondary | ICD-10-CM

## 2013-09-28 HISTORY — DX: Immunization not carried out because of patient refusal: Z28.21

## 2013-10-26 ENCOUNTER — Encounter: Payer: Self-pay | Admitting: Medical

## 2013-10-26 ENCOUNTER — Ambulatory Visit (INDEPENDENT_AMBULATORY_CARE_PROVIDER_SITE_OTHER): Payer: 59 | Admitting: Medical

## 2013-10-26 VITALS — BP 122/74 | HR 68 | Temp 98.2°F | Resp 16 | Ht 66.5 in | Wt 166.0 lb

## 2013-10-26 DIAGNOSIS — F17200 Nicotine dependence, unspecified, uncomplicated: Secondary | ICD-10-CM

## 2013-10-26 DIAGNOSIS — Z8673 Personal history of transient ischemic attack (TIA), and cerebral infarction without residual deficits: Secondary | ICD-10-CM

## 2013-10-26 DIAGNOSIS — Z2821 Immunization not carried out because of patient refusal: Secondary | ICD-10-CM

## 2013-10-26 DIAGNOSIS — H9319 Tinnitus, unspecified ear: Secondary | ICD-10-CM

## 2013-10-26 DIAGNOSIS — Z9119 Patient's noncompliance with other medical treatment and regimen: Secondary | ICD-10-CM

## 2013-10-26 DIAGNOSIS — Z720 Tobacco use: Secondary | ICD-10-CM

## 2013-10-26 DIAGNOSIS — Z0000 Encounter for general adult medical examination without abnormal findings: Secondary | ICD-10-CM

## 2013-10-26 DIAGNOSIS — Z91199 Patient's noncompliance with other medical treatment and regimen due to unspecified reason: Secondary | ICD-10-CM

## 2013-10-26 DIAGNOSIS — R3129 Other microscopic hematuria: Secondary | ICD-10-CM

## 2013-10-26 DIAGNOSIS — I10 Essential (primary) hypertension: Secondary | ICD-10-CM

## 2013-10-26 DIAGNOSIS — Z125 Encounter for screening for malignant neoplasm of prostate: Secondary | ICD-10-CM

## 2013-10-26 LAB — CBC WITH DIFFERENTIAL/PLATELET
Basophils Absolute: 0 10*3/uL (ref 0.0–0.1)
Basophils Relative: 1 % (ref 0–1)
Eosinophils Absolute: 0.1 10*3/uL (ref 0.0–0.7)
Eosinophils Relative: 2 % (ref 0–5)
HEMATOCRIT: 44.3 % (ref 39.0–52.0)
HEMOGLOBIN: 15.3 g/dL (ref 13.0–17.0)
LYMPHS ABS: 1.1 10*3/uL (ref 0.7–4.0)
LYMPHS PCT: 25 % (ref 12–46)
MCH: 29.2 pg (ref 26.0–34.0)
MCHC: 34.5 g/dL (ref 30.0–36.0)
MCV: 84.5 fL (ref 78.0–100.0)
MONO ABS: 0.4 10*3/uL (ref 0.1–1.0)
MONOS PCT: 9 % (ref 3–12)
NEUTROS ABS: 2.8 10*3/uL (ref 1.7–7.7)
Neutrophils Relative %: 63 % (ref 43–77)
Platelets: 205 10*3/uL (ref 150–400)
RBC: 5.24 MIL/uL (ref 4.22–5.81)
RDW: 15.3 % (ref 11.5–15.5)
WBC: 4.4 10*3/uL (ref 4.0–10.5)

## 2013-10-26 LAB — COMPREHENSIVE METABOLIC PANEL
ALBUMIN: 4.3 g/dL (ref 3.5–5.2)
ALT: 11 U/L (ref 0–53)
AST: 17 U/L (ref 0–37)
Alkaline Phosphatase: 66 U/L (ref 39–117)
BUN: 15 mg/dL (ref 6–23)
CALCIUM: 9.2 mg/dL (ref 8.4–10.5)
CHLORIDE: 107 meq/L (ref 96–112)
CO2: 25 meq/L (ref 19–32)
CREATININE: 0.89 mg/dL (ref 0.50–1.35)
Glucose, Bld: 96 mg/dL (ref 70–99)
Potassium: 4.4 mEq/L (ref 3.5–5.3)
Sodium: 140 mEq/L (ref 135–145)
Total Bilirubin: 0.5 mg/dL (ref 0.2–1.2)
Total Protein: 6.8 g/dL (ref 6.0–8.3)

## 2013-10-26 LAB — POCT URINALYSIS DIPSTICK
BILIRUBIN UA: NEGATIVE
Glucose, UA: NEGATIVE
KETONES UA: NEGATIVE
LEUKOCYTES UA: NEGATIVE
Nitrite, UA: NEGATIVE
PH UA: 5
Spec Grav, UA: 1.02
Urobilinogen, UA: NEGATIVE

## 2013-10-26 LAB — LIPID PANEL
CHOL/HDL RATIO: 3.7 ratio
CHOLESTEROL: 194 mg/dL (ref 0–200)
HDL: 52 mg/dL (ref >39)
LDL Cholesterol: 126 mg/dL — ABNORMAL HIGH (ref 0–99)
Triglycerides: 81 mg/dL (ref <150)
VLDL: 16 mg/dL (ref 0–40)

## 2013-10-26 NOTE — Progress Notes (Signed)
Subjective:   HPI  Justin [name] is a 63 y.o. male who presents for a complete physical.   Preventative care: Last ophthalmology visit: last eye doctor visit >1 year ago Last dental visit: about a year ago Last colonoscopy: ? Prior sigmoidoscopy, not sure of date Last prostate exam: > several years ago Last EKG: few years ago Last labs: few years ago  Prior vaccinations: TD or Tdap: up to date per employer Influenza: declines. Pneumococcal: none prior Shingles/Zostavax: none prior  Concerns: After reviewing history and discussing recommendations, Justin [name] spends about 10 minutes on a diatribe about why cholesterol testing is flawed, innacurrate, why he won't take BP medications due to various side effects, and basically seems unwilling to make any medication changes if recommended although he does want routine labs and screening. So unclear how he wants to proceed.    He does have fears of ED with BP medications .  Feels like his penis has shrunk some over the last few years.    Has 1 sexual partner, girlfriend, monogamous, no concern for STD, declines STD testing.   Reviewed their medical, surgical, family, social, medication, and allergy history and updated chart as appropriate.  Past Medical History  Diagnosis Date  . Calcium oxalate renal stones   . Rheumatic fever     age 42  . Chewing tobacco use   . Stroke 10/02/2008  . Gallstones     hx/o, s/p choleycystectomy  . Hypertension     hx/o, noncompliance prior with medication  . Influenza vaccine refused 09/2013  . Tinnitus     Past Surgical History  Procedure Laterality Date  . Rotator cuff repair      Right  . Brain surgery  1997    HEMATOMA  . Dental implant    . Blood clot  1997    brain  . Cholecystectomy  09/06/2011    Procedure: LAPAROSCOPIC CHOLECYSTECTOMY WITH INTRAOPERATIVE CHOLANGIOGRAM;  Surgeon: Earnstine Regal, MD;  Location: [facility];  Service: General;  Laterality: N/A;  . Joint replacement  2004   LEFT BICEP REPAIR, RTC repair  . Sigmoidoscopy      History   Social History  . Marital Status: Single    Spouse Name: N/A    Number of Children: 2  . Years of Education: N/A   Occupational History  .  [employer]   Social History Main Topics  . Smoking status: Never Smoker   . Smokeless tobacco: Current User    Types: Chew  . Alcohol Use: No  . Drug Use: No  . Sexual Activity: Not on file   Other Topics Concern  . Not on file   Social History Narrative   Single, has 2 sons, 66yo and 16yo, electrician, walks regularly, 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      Family History  Problem Relation Age of Onset  . Heart disease Mother 47    MI  . Migraines Mother   . Colon cancer Neg Hx   . Cancer Neg Hx   . Stroke Neg Hx   . Diabetes Neg Hx   . Hypertension Neg Hx   . Hyperlipidemia Neg Hx   . Cirrhosis Brother   . Other Father     unknown    Current outpatient prescriptions:aspirin 81 MG tablet, Take 81 mg by mouth daily.  , Disp: , Rfl:   No Known Allergies   Review of Systems Constitutional: -fever, -chills, -sweats, -unexpected weight change, -decreased appetite, -  fatigue Allergy: -sneezing, -itching, -congestion Dermatology: -changing moles, --rash, -lumps ENT: -runny nose, -ear pain, -sore throat, -hoarseness, -sinus pain, -teeth pain, - ringing in ears, -hearing loss, -nosebleeds Cardiology: -chest pain, -palpitations, -swelling, -difficulty breathing when lying flat, -waking up short of breath Respiratory: -cough, -shortness of breath, -difficulty breathing with exercise or exertion, -wheezing, -coughing up blood Gastroenterology: -abdominal pain, -nausea, -vomiting, -diarrhea, -constipation, -blood in stool, -changes in bowel movement, -difficulty swallowing or eating Hematology: -bleeding, -bruising  Musculoskeletal: -joint aches, -muscle aches, -joint swelling, -back pain, -neck pain, -cramping, -changes in gait Ophthalmology: denies vision  changes, eye redness, itching, discharge Urology: +penis seems smaller than prior, -burning with urination, -difficulty urinating, -blood in urine, -urinary frequency, -urgency, -incontinence Neurology: +tinnitus ongoing for years, -headache, -weakness, -tingling, -numbness, -memory loss, -falls, -dizziness Psychology: -depressed mood, -agitation, -sleep problems     Objective:   Physical Exam  BP 122/74  Pulse 68  Temp(Src) 98.2 F (36.8 C) (Oral)  Resp 16  Ht 5' 6.5" (1.689 m)  Wt 166 lb (75.297 kg)  BMI 26.39 kg/m2  General appearance: alert, no distress, WD/WN, white male Skin: left neck anteriorly with 41mm raised purplish lesion unchanged per patient, saw dermatology for this prior, suggested hemangioma since it occasionally bleeds if knicked, scattered macules throughout, no particularly worrisome lesions, linear callous of right lateral 2nd finger from fishing pole position, no other worrisome lesions HEENT: normocephalic, conjunctiva/corneas normal, sclerae anicteric, PERRLA, EOMi, nares patent, no discharge or erythema, pharynx normal Oral cavity: MMM, tongue normal, teeth - in good repair, lower implant, questionable white patches in left buccal mucosa and tongue Neck: supple, no lymphadenopathy, no thyromegaly, no masses, normal ROM, no bruits Chest: non tender, normal shape and expansion Heart: RRR, normal S1, S2, no murmurs Lungs: CTA bilaterally, no wheezes, rhonchi, or rales Abdomen: +bs, RUQ port surgical scars, soft, non tender, non distended, no masses, no hepatomegaly, no splenomegaly, no bruits Back: non tender, normal ROM, no scoliosis Musculoskeletal: left shoulder surgical scars,otherwise upper extremities non tender, no obvious deformity, normal ROM throughout, lower extremities non tender, no obvious deformity, normal ROM throughout Extremities: no edema, no cyanosis, no clubbing Pulses: 2+ symmetric, upper and lower extremities, normal cap refill Neurological:  alert, oriented x 3, CN2-12 intact, strength normal upper extremities and lower extremities, sensation normal throughout, DTRs 2+ throughout, no cerebellar signs, gait normal Psychiatric: normal affect, behavior normal, pleasant  GU: normal male external genitalia, circumcised, nontender, no masses, no hernia, no lymphadenopathy Rectal: anus normal tone, prostate seems firm, slightly enlarged, no nodules, occult negative stool   Assessment and Plan :   Encounter Diagnoses  Name Primary?  . Routine general medical examination at a health care facility Yes  . History of CVA (cerebrovascular accident)   . Essential hypertension, benign   . Smokeless tobacco use   . Screening for prostate cancer   . Tinnitus, unspecified laterality   . Influenza vaccine refused   . Microscopic hematuria     Physical exam - discussed healthy lifestyle, diet, exercise, preventative care, vaccinations, and addressed their concerns.    Hx/o CVA - discussed recommends for secondary stroke prevention.  Discussed aspirin therapy, statin, BP goals, LDL goals.  He doesn't seem very open to any of the recommendations.  HTN - normal BP today.  discussed goal of 120/80 or less.  He was advised to start checking BPs.  If not staying at goal, then would need to be back on anti-hypertensive  Smokeless tobacco - advised cessation.  He  is not ready to quit.  Advised soon f/u with dentist given white patches in buccal mucosa and tongue  Prostate cancer screening today per his request  Tinnitus - hearing screen mildly abnormal.  Discussed possible causes.  consider audiology consult.   Advised he check insurance coverage for shingles vaccine.  Declined influenza vaccine  He was found to have numerous RBCs on urine micro today.   Will call back about this along with other lab results tomorrow.   Looking back through various prior records from here, from neurology, he has been noncompliant with BP and lipid lowering  therapies.    Follow-up pending labs

## 2013-10-27 ENCOUNTER — Other Ambulatory Visit: Payer: Self-pay | Admitting: Medical

## 2013-10-27 LAB — PSA: PSA: 1.49 ng/mL (ref <=4.00)

## 2013-10-27 LAB — TSH: TSH: 1.387 u[IU]/mL (ref 0.350–4.500)

## 2013-10-27 MED ORDER — ROSUVASTATIN CALCIUM 20 MG PO TABS: 20.0000 mg | ORAL_TABLET | Freq: Every day | ORAL | Status: DC

## 2013-10-27 MED ORDER — ASPIRIN 81 MG PO TABS: 81.0000 mg | ORAL_TABLET | Freq: Every day | ORAL | Status: DC

## 2013-11-05 ENCOUNTER — Other Ambulatory Visit: Payer: Self-pay | Admitting: Family Medicine

## 2013-11-05 ENCOUNTER — Telehealth: Payer: Self-pay | Admitting: Internal Medicine

## 2013-11-05 DIAGNOSIS — Z1211 Encounter for screening for malignant neoplasm of colon: Secondary | ICD-10-CM

## 2013-11-05 NOTE — Telephone Encounter (Signed)
Ok, refer.  He has seen GI prior, so make sure he wants to refer back there.

## 2013-11-05 NOTE — Telephone Encounter (Signed)
Pt states he is now wanting to get referred for his colonoscopy

## 2013-11-05 NOTE — Telephone Encounter (Signed)
Colonoscopy 01/04/2014 @ 1030 am Dr. Ardis Hughs and the nurse visit is on 12/21/13/@ 230 pm. [facility].

## 2013-11-08 ENCOUNTER — Encounter: Payer: Self-pay | Admitting: Gastroenterology

## 2013-11-09 NOTE — Telephone Encounter (Signed)
Patient is aware 

## 2013-12-21 ENCOUNTER — Ambulatory Visit (AMBULATORY_SURGERY_CENTER): Payer: Self-pay

## 2013-12-21 VITALS — Ht 66.0 in | Wt 159.0 lb

## 2013-12-21 DIAGNOSIS — Z1211 Encounter for screening for malignant neoplasm of colon: Secondary | ICD-10-CM

## 2013-12-21 MED ORDER — MOVIPREP 100 G PO SOLR: ORAL | Status: DC

## 2013-12-21 NOTE — Progress Notes (Signed)
Per pt, no allergies to soy or egg products.Pt not taking any weight loss meds or using  O2 at home.    PATIENT HAS A PHOBIA OF SEDATION!

## 2014-01-04 ENCOUNTER — Encounter: Payer: Self-pay | Admitting: Gastroenterology

## 2014-01-04 ENCOUNTER — Ambulatory Visit (AMBULATORY_SURGERY_CENTER): Payer: 59 | Admitting: Gastroenterology

## 2014-01-04 VITALS — BP 135/94 | HR 67 | Temp 96.6°F | Resp 16 | Ht 66.0 in | Wt 159.0 lb

## 2014-01-04 DIAGNOSIS — Z1211 Encounter for screening for malignant neoplasm of colon: Secondary | ICD-10-CM

## 2014-01-04 HISTORY — PX: COLONOSCOPY: SHX174

## 2014-01-04 MED ORDER — SODIUM CHLORIDE 0.9 % IV SOLN: 500.0000 mL | INTRAVENOUS | Status: DC

## 2014-01-04 NOTE — Patient Instructions (Signed)
YOU HAD AN ENDOSCOPIC PROCEDURE TODAY AT THE [facility]: Refer to the procedure report that was given to you for any specific questions about what was found during the examination.  If the procedure report does not answer your questions, please call your gastroenterologist to clarify.  If you requested that your care partner not be given the details of your procedure findings, then the procedure report has been included in a sealed envelope for you to review at your convenience later.  YOU SHOULD EXPECT: Some feelings of bloating in the abdomen. Passage of more gas than usual.  Walking can help get rid of the air that was put into your GI tract during the procedure and reduce the bloating. If you had a lower endoscopy (such as a colonoscopy or flexible sigmoidoscopy) you may notice spotting of blood in your stool or on the toilet paper. If you underwent a bowel prep for your procedure, then you may not have a normal bowel movement for a few days.  DIET: Your first meal following the procedure should be a light meal and then it is ok to progress to your normal diet.  A half-sandwich or bowl of soup is an example of a good first meal.  Heavy or fried foods are harder to digest and may make you feel nauseous or bloated.  Likewise meals heavy in dairy and vegetables can cause extra gas to form and this can also increase the bloating.  Drink plenty of fluids but you should avoid alcoholic beverages for 24 hours.  ACTIVITY: Your care partner should take you home directly after the procedure.  You should plan to take it easy, moving slowly for the rest of the day.  You can resume normal activity the day after the procedure however you should NOT DRIVE or use heavy machinery for 24 hours (because of the sedation medicines used during the test).    SYMPTOMS TO REPORT IMMEDIATELY: A gastroenterologist can be reached at any hour.  During normal business hours, 8:30 AM to 5:00 PM Monday through Friday,  call [phone].  After hours and on weekends, please call the GI answering service at [phone] who will take a message and have the physician on call contact you.   Following lower endoscopy (colonoscopy or flexible sigmoidoscopy):  Excessive amounts of blood in the stool  Significant tenderness or worsening of abdominal pains  Swelling of the abdomen that is new, acute  Fever of 100F or higher  FOLLOW UP: If any biopsies were taken you will be contacted by phone or by letter within the next 1-3 weeks.  Call your gastroenterologist if you have not heard about the biopsies in 3 weeks.  Our staff will call the home number listed on your records the next business day following your procedure to check on you and address any questions or concerns that you may have at that time regarding the information given to you following your procedure. This is a courtesy call and so if there is no answer at the home number and we have not heard from you through the emergency physician on call, we will assume that you have returned to your regular daily activities without incident.  SIGNATURES/CONFIDENTIALITY: You and/or your care partner have signed paperwork which will be entered into your electronic medical record.  These signatures attest to the fact that that the information above on your After Visit Summary has been reviewed and is understood.  Full responsibility of the confidentiality of this   discharge information lies with you and/or your care-partner.  Recommendations Next routine colonoscopy will be in 10 years.

## 2014-01-04 NOTE — Progress Notes (Signed)
Procedure ends, to recovery, report given and VSS. 

## 2014-01-05 ENCOUNTER — Telehealth: Payer: Self-pay | Admitting: Gastroenterology

## 2014-01-05 ENCOUNTER — Telehealth: Payer: Self-pay

## 2014-01-05 NOTE — Telephone Encounter (Signed)
  Follow up Call-  Call back number 01/04/2014  Post procedure Call Back phone  # [phone]  Permission to leave phone message Yes     Patient questions:  Do you have a fever, pain , or abdominal swelling? No. Pain Score  0 *  Have you tolerated food without any problems? Yes.    Have you been able to return to your normal activities? Yes.    Do you have any questions about your discharge instructions: Diet   No. Medications  No. Follow up visit  No.  Do you have questions or concerns about your Care? No.  Actions: * If pain score is 4 or above: No action needed, pain <4.  No problems per the pt. maw

## 2014-01-05 NOTE — Op Note (Signed)
[facility]  [address] [place], [id number]   COLONOSCOPY PROCEDURE REPORT  PATIENT: Justin [name], Justin [name]  MR#: [id number] BIRTHDATE: 1950/06/05 , 89  yrs. old GENDER: male ENDOSCOPIST: Milus Banister, MD REFERRED BY: Jill Alexanders, MD PROCEDURE DATE:  01/04/2014 PROCEDURE:   Colonoscopy, screening First Screening Colonoscopy - Avg.  risk and is 50 yrs.  old or older Yes.  Prior Negative Screening - Now for repeat screening. N/A  History of Adenoma - Now for follow-up colonoscopy & has been > or = to 3 yrs.  N/A  Polyps Removed Today? No.  Recommend repeat exam, <10 yrs? No. ASA CLASS:   Class II INDICATIONS:average risk for colon cancer. MEDICATIONS: Monitored anesthesia care and Propofol 200 mg IV  DESCRIPTION OF PROCEDURE:   After the risks benefits and alternatives of the procedure were thoroughly explained, informed consent was obtained.  The digital rectal exam revealed no abnormalities of the rectum.   The LB [id number]  endoscope was introduced through the anus and advanced to the cecum, which was identified by both the appendix and ileocecal valve. No adverse events experienced.   The quality of the prep was excellent.  The instrument was then slowly withdrawn as the colon was fully examined.    COLON FINDINGS: A normal appearing cecum, ileocecal valve, and appendiceal orifice were identified.  The ascending, transverse, descending, sigmoid colon, and rectum appeared unremarkable. Retroflexed views revealed no abnormalities. The time to cecum=1 minutes 22 seconds.  Withdrawal time=6 minutes 46 seconds.  The scope was withdrawn and the procedure completed. COMPLICATIONS: There were no immediate complications.  ENDOSCOPIC IMPRESSION: Normal colonoscopy No polyps or cancers  RECOMMENDATIONS: You should continue to follow colorectal cancer screening guidelines for "routine risk" patients with a repeat colonoscopy in 10 years.  eSigned:   Milus Banister, MD 01/04/2014 11:15 AM

## 2014-01-06 NOTE — Telephone Encounter (Signed)
Dr Ardis Hughs this pt is asking to speak directly to you.

## 2014-01-06 NOTE — Progress Notes (Signed)
Patient ID: Justin [name], male   DOB: 06-Sep-1950, 63 y.o.   MRN: [id number] Error

## 2014-01-06 NOTE — Telephone Encounter (Signed)
i called two of his numbers, no answer and so I left message asking him to call back.

## 2014-09-01 ENCOUNTER — Emergency Department (HOSPITAL_COMMUNITY)
Admission: EM | Admit: 2014-09-01 | Discharge: 2014-09-01 | Disposition: A | Discharge status: Home / Self Care | Payer: Medicare Other | Attending: Physician Assistant | Admitting: Physician Assistant

## 2014-09-01 ENCOUNTER — Emergency Department (HOSPITAL_COMMUNITY): Payer: Medicare Other

## 2014-09-01 ENCOUNTER — Encounter (HOSPITAL_COMMUNITY): Payer: Self-pay | Admitting: *Deleted

## 2014-09-01 DIAGNOSIS — R109 Unspecified abdominal pain: Secondary | ICD-10-CM | POA: Diagnosis present

## 2014-09-01 DIAGNOSIS — E785 Hyperlipidemia, unspecified: Secondary | ICD-10-CM | POA: Insufficient documentation

## 2014-09-01 DIAGNOSIS — I10 Essential (primary) hypertension: Secondary | ICD-10-CM | POA: Insufficient documentation

## 2014-09-01 DIAGNOSIS — Z8669 Personal history of other diseases of the nervous system and sense organs: Secondary | ICD-10-CM | POA: Diagnosis not present

## 2014-09-01 DIAGNOSIS — N201 Calculus of ureter: Secondary | ICD-10-CM | POA: Diagnosis not present

## 2014-09-01 DIAGNOSIS — N200 Calculus of kidney: Secondary | ICD-10-CM | POA: Diagnosis not present

## 2014-09-01 DIAGNOSIS — Z8673 Personal history of transient ischemic attack (TIA), and cerebral infarction without residual deficits: Secondary | ICD-10-CM | POA: Diagnosis not present

## 2014-09-01 DIAGNOSIS — Z8719 Personal history of other diseases of the digestive system: Secondary | ICD-10-CM | POA: Insufficient documentation

## 2014-09-01 DIAGNOSIS — Z7982 Long term (current) use of aspirin: Secondary | ICD-10-CM | POA: Diagnosis not present

## 2014-09-01 DIAGNOSIS — Z9049 Acquired absence of other specified parts of digestive tract: Secondary | ICD-10-CM | POA: Diagnosis not present

## 2014-09-01 LAB — CBC WITH DIFFERENTIAL/PLATELET
BASOS ABS: 0 10*3/uL (ref 0.0–0.1)
BASOS PCT: 0 % (ref 0–1)
Eosinophils Absolute: 0 10*3/uL (ref 0.0–0.7)
Eosinophils Relative: 0 % (ref 0–5)
HEMATOCRIT: 44.3 % (ref 39.0–52.0)
Hemoglobin: 15.5 g/dL (ref 13.0–17.0)
LYMPHS ABS: 0.7 10*3/uL (ref 0.7–4.0)
Lymphocytes Relative: 6 % — ABNORMAL LOW (ref 12–46)
MCH: 28.9 pg (ref 26.0–34.0)
MCHC: 35 g/dL (ref 30.0–36.0)
MCV: 82.5 fL (ref 78.0–100.0)
MONO ABS: 0.7 10*3/uL (ref 0.1–1.0)
Monocytes Relative: 6 % (ref 3–12)
Neutro Abs: 10.1 10*3/uL — ABNORMAL HIGH (ref 1.7–7.7)
Neutrophils Relative %: 88 % — ABNORMAL HIGH (ref 43–77)
PLATELETS: 192 10*3/uL (ref 150–400)
RBC: 5.37 MIL/uL (ref 4.22–5.81)
RDW: 14 % (ref 11.5–15.5)
WBC: 11.5 10*3/uL — ABNORMAL HIGH (ref 4.0–10.5)

## 2014-09-01 LAB — BASIC METABOLIC PANEL
Anion gap: 9 (ref 5–15)
BUN: 10 mg/dL (ref 6–20)
CO2: 22 mmol/L (ref 22–32)
CREATININE: 1.12 mg/dL (ref 0.61–1.24)
Calcium: 9.3 mg/dL (ref 8.9–10.3)
Chloride: 108 mmol/L (ref 101–111)
GFR calc Af Amer: >60 mL/min (ref >60)
GFR calc non Af Amer: >60 mL/min (ref >60)
Glucose, Bld: 147 mg/dL — ABNORMAL HIGH (ref 65–99)
POTASSIUM: 3.8 mmol/L (ref 3.5–5.1)
SODIUM: 139 mmol/L (ref 135–145)

## 2014-09-01 LAB — URINE MICROSCOPIC-ADD ON

## 2014-09-01 LAB — URINALYSIS, ROUTINE W REFLEX MICROSCOPIC
Bilirubin Urine: NEGATIVE
Glucose, UA: NEGATIVE mg/dL
KETONES UR: 15 mg/dL — AB
NITRITE: NEGATIVE
PH: 8 (ref 5.0–8.0)
Protein, ur: NEGATIVE mg/dL
Specific Gravity, Urine: 1.02 (ref 1.005–1.030)
UROBILINOGEN UA: 1 mg/dL (ref 0.0–1.0)

## 2014-09-01 MED ORDER — OXYCODONE-ACETAMINOPHEN 5-325 MG PO TABS
1.0000 | ORAL_TABLET | Freq: Four times a day (QID) | ORAL | Status: DC | PRN
Start: 2014-09-01 — End: 2015-02-24

## 2014-09-01 MED ORDER — HYDROMORPHONE HCL 1 MG/ML IJ SOLN
1.0000 mg | Freq: Once | INTRAMUSCULAR | Status: AC
  Administered 2014-09-01: 1 mg via INTRAVENOUS
  Filled 2014-09-01: qty 1

## 2014-09-01 MED ORDER — ONDANSETRON HCL 4 MG/2ML IJ SOLN
4.0000 mg | Freq: Once | INTRAMUSCULAR | Status: AC
  Administered 2014-09-01: 4 mg via INTRAVENOUS
  Filled 2014-09-01: qty 2

## 2014-09-01 MED ORDER — SODIUM CHLORIDE 0.9 % IV BOLUS (SEPSIS)
1000.0000 mL | Freq: Once | INTRAVENOUS | Status: AC
  Administered 2014-09-01: 1000 mL via INTRAVENOUS

## 2014-09-01 MED ORDER — OXYCODONE-ACETAMINOPHEN 5-325 MG PO TABS
1.0000 | ORAL_TABLET | Freq: Once | ORAL | Status: AC
  Administered 2014-09-01: 1 via ORAL
  Filled 2014-09-01: qty 1

## 2014-09-01 NOTE — ED Notes (Signed)
Patient transported to Ultrasound 

## 2014-09-01 NOTE — ED Notes (Addendum)
Woke from sleep with left flank pain.  Difficulty voiding.  Bladder scan results = 0 urine in bladder.  Pt lying in bed, knees pulled up to chest.  + nausea.  Pain waxes and wanes.

## 2014-09-01 NOTE — ED Provider Notes (Addendum)
CSN: [id number]     Arrival date & time 09/01/14  3154 History   First MD Initiated Contact with Patient 09/01/14 [phone]     Chief Complaint  Patient presents with  . Flank Pain     (Consider location/radiation/quality/duration/timing/severity/associated sxs/prior Treatment) HPI   Patient is a 64 year old male with history of hypertension hyperlipidemia and remote neurosurgery and history of kidney stones. Patient reports today with left flank pain. He states it feels like it's a stone. Patient has had some nausea and vomiting. No fevers. This happened all of a sudden this morning. No back pain. Or abdominal pain.  Past Medical History  Diagnosis Date  . Calcium oxalate renal stones   . Rheumatic fever     age 48  . Chewing tobacco use   . Stroke 10/02/2008  . Gallstones     hx/o, s/p choleycystectomy  . Hypertension     hx/o, noncompliance prior with medication  . Influenza vaccine refused 09/2013  . Tinnitus   . H/O echocardiogram 10/2008    no wall thickness, EF 55%, mild mitral regurge  . Hyperlipidemia    Past Surgical History  Procedure Laterality Date  . Rotator cuff repair      Right  . Dental implant    . Blood clot  1997    brain  . Cholecystectomy  09/06/2011    Procedure: LAPAROSCOPIC CHOLECYSTECTOMY WITH INTRAOPERATIVE CHOLANGIOGRAM;  Surgeon: Earnstine Regal, MD;  Location: [facility];  Service: General;  Laterality: N/A;  . Joint replacement  2004    LEFT BICEP REPAIR, RTC repair  . Sigmoidoscopy    . Brain surgery  1997    craniotomy, evacuation of hematoma   Family History  Problem Relation Age of Onset  . Heart disease Mother 12    MI  . Migraines Mother   . Colon cancer Neg Hx   . Cancer Neg Hx   . Stroke Neg Hx   . Hypertension Neg Hx   . Hyperlipidemia Neg Hx   . Cirrhosis Brother   . Other Father     unknown  . Diabetes Brother    History  Substance Use Topics  . Smoking status: Never Smoker   . Smokeless tobacco: Current User    Types: Chew  .  Alcohol Use: No    Review of Systems  Constitutional: Negative for fever and activity change.  HENT: Negative for drooling and hearing loss.   Eyes: Negative for discharge and redness.  Respiratory: Negative for cough and shortness of breath.   Cardiovascular: Negative for chest pain.  Gastrointestinal: Positive for vomiting.       Left flank pain  Genitourinary: Negative for dysuria and urgency.  Musculoskeletal: Negative for arthralgias.  Allergic/Immunologic: Negative for immunocompromised state.  Neurological: Negative for seizures and speech difficulty.  Psychiatric/Behavioral: Negative for behavioral problems and agitation.  All other systems reviewed and are negative.     Allergies  Review of patient's allergies indicates no known allergies.  Home Medications   Prior to Admission medications   Medication Sig Start Date End Date Taking? Authorizing Provider  aspirin 81 MG tablet Take 1 tablet (81 mg total) by mouth daily. Patient not taking: Reported on 12/21/2013 10/27/13   Camelia Eng Tysinger, PA-C  oxyCODONE-acetaminophen (PERCOCET/ROXICET) 5-325 MG per tablet Take 1 tablet by mouth every 6 (six) hours as needed for severe pain. 09/01/14   Felissa Blouch Lyn Pamelia Botto, MD  rosuvastatin (CRESTOR) 20 MG tablet Take 1 tablet (20 mg total) by mouth  daily. Patient not taking: Reported on 12/21/2013 10/27/13   Camelia Eng Tysinger, PA-C   BP 169/92 mmHg  Pulse 78  Temp(Src) 97.9 F (36.6 C) (Oral)  Resp 20  SpO2 96% Physical Exam  Constitutional: He is oriented to person, place, and time. He appears well-nourished.  HENT:  Head: Normocephalic.  Mouth/Throat: Oropharynx is clear and moist.  Eyes: Conjunctivae are normal.  Neck: No tracheal deviation present.  Cardiovascular: Normal rate.   Pulmonary/Chest: Effort normal. No stridor. No respiratory distress.  Abdominal: Soft. There is no tenderness. There is no guarding.  Abdomen nontender. Left CVA tenderness.  Musculoskeletal:  Normal range of motion. He exhibits no edema.  Neurological: He is oriented to person, place, and time. No cranial nerve deficit.  Skin: Skin is warm and dry. No rash noted. He is not diaphoretic.  Psychiatric: He has a normal mood and affect. His behavior is normal.  Nursing note and vitals reviewed.   ED Course  Procedures (including critical care time) Labs Review Labs Reviewed  URINALYSIS, ROUTINE W REFLEX MICROSCOPIC (NOT AT [facility]) - Abnormal; Notable for the following:    APPearance TURBID (*)    Hgb urine dipstick LARGE (*)    Ketones, ur 15 (*)    Leukocytes, UA TRACE (*)    All other components within normal limits  BASIC METABOLIC PANEL - Abnormal; Notable for the following:    Glucose, Bld 147 (*)    All other components within normal limits  CBC WITH DIFFERENTIAL/PLATELET - Abnormal; Notable for the following:    WBC 11.5 (*)    Neutrophils Relative % 88 (*)    Neutro Abs 10.1 (*)    Lymphocytes Relative 6 (*)    All other components within normal limits  URINE MICROSCOPIC-ADD ON - Abnormal; Notable for the following:    Squamous Epithelial / LPF FEW (*)    Bacteria, UA FEW (*)    All other components within normal limits    Imaging Review US Renal  09/01/2014   CLINICAL DATA:  Acute onset left flank pain  EXAM: RENAL / URINARY TRACT ULTRASOUND COMPLETE  COMPARISON:  September 15, 2009  FINDINGS: Right Kidney:  Length: 10.7 cm. Echogenicity and renal cortical thickness are within normal limits. No mass, perinephric fluid, or hydronephrosis visualized. No sonographically demonstrable calculus or ureterectasis.  Left Kidney:  Length: 11.8 cm. Echogenicity and renal cortical thickness within normal limits. No mass or perinephric fluid visualized. There is mild pelvicaliectasis on the left. No sonographically demonstrable calculus or ureterectasis.  Bladder:  Appears normal for degree of bladder distention.  IMPRESSION: Mild pelvicaliectasis on the left. Obstructing focus not  seen. A small ureteral calculus could cause this pelvicaliectasis on the left.  Study otherwise unremarkable.   Electronically Signed   By: Lowella Grip III M.D.   On: 09/01/2014 09:08     EKG Interpretation None      MDM   Final diagnoses:  Kidney stone    Patient is a 64 year old male presenting with left flank pain. Patient states it feels like his normal stone. Patient is very uncomfortable on arrival and is writhing on the bed. Patient requesting fluid and pain medication. We will give fluids and pain medication and get ultrasound of left kidney. Anticipate ability to discharge home once patient is comfortable in taking medications and fluids by mouth.  Do not suspect any other cardiology for his left flank pain.   Patient showed evidence of stone, no infection in urine. He feels  resolved. We'll discharge him home to follow up with PCP (and urology)    Macarthur Critchley, MD 09/01/14 [place], MD 09/01/14 1448

## 2014-09-01 NOTE — ED Notes (Signed)
Pt. Can't sit still and is walking around the room in visible discomfort. Unable to hook up to the monitor at the moment.

## 2014-09-01 NOTE — Discharge Instructions (Signed)

## 2014-09-01 NOTE — ED Notes (Signed)
Pt c/o lt flank pain since 0400am today with n v

## 2015-02-24 ENCOUNTER — Ambulatory Visit (INDEPENDENT_AMBULATORY_CARE_PROVIDER_SITE_OTHER): Payer: Medicare Other | Admitting: Medical

## 2015-02-24 ENCOUNTER — Telehealth: Payer: Self-pay | Admitting: Medical

## 2015-02-24 ENCOUNTER — Encounter: Payer: Self-pay | Admitting: Medical

## 2015-02-24 VITALS — BP 140/90 | HR 70 | Ht 67.0 in | Wt 163.0 lb

## 2015-02-24 DIAGNOSIS — I10 Essential (primary) hypertension: Secondary | ICD-10-CM

## 2015-02-24 DIAGNOSIS — L989 Disorder of the skin and subcutaneous tissue, unspecified: Secondary | ICD-10-CM | POA: Diagnosis not present

## 2015-02-24 DIAGNOSIS — Z9119 Patient's noncompliance with other medical treatment and regimen: Secondary | ICD-10-CM | POA: Diagnosis not present

## 2015-02-24 DIAGNOSIS — Z91199 Patient's noncompliance with other medical treatment and regimen due to unspecified reason: Secondary | ICD-10-CM

## 2015-02-24 DIAGNOSIS — Z720 Tobacco use: Secondary | ICD-10-CM

## 2015-02-24 DIAGNOSIS — E785 Hyperlipidemia, unspecified: Secondary | ICD-10-CM | POA: Diagnosis not present

## 2015-02-24 DIAGNOSIS — E782 Mixed hyperlipidemia: Secondary | ICD-10-CM | POA: Insufficient documentation

## 2015-02-24 DIAGNOSIS — Z7185 Encounter for immunization safety counseling: Secondary | ICD-10-CM

## 2015-02-24 DIAGNOSIS — L84 Corns and callosities: Secondary | ICD-10-CM | POA: Insufficient documentation

## 2015-02-24 DIAGNOSIS — Z7189 Other specified counseling: Secondary | ICD-10-CM

## 2015-02-24 DIAGNOSIS — Z0000 Encounter for general adult medical examination without abnormal findings: Secondary | ICD-10-CM | POA: Diagnosis not present

## 2015-02-24 DIAGNOSIS — Z87442 Personal history of urinary calculi: Secondary | ICD-10-CM

## 2015-02-24 DIAGNOSIS — R202 Paresthesia of skin: Secondary | ICD-10-CM

## 2015-02-24 LAB — COMPREHENSIVE METABOLIC PANEL
ALT: 17 U/L (ref 9–46)
AST: 20 U/L (ref 10–35)
Albumin: 4.3 g/dL (ref 3.6–5.1)
Alkaline Phosphatase: 71 U/L (ref 40–115)
BILIRUBIN TOTAL: 0.5 mg/dL (ref 0.2–1.2)
BUN: 11 mg/dL (ref 7–25)
CALCIUM: 9 mg/dL (ref 8.6–10.3)
CHLORIDE: 105 mmol/L (ref 98–110)
CO2: 26 mmol/L (ref 20–31)
Creat: 0.88 mg/dL (ref 0.70–1.25)
GLUCOSE: 97 mg/dL (ref 65–99)
POTASSIUM: 4.8 mmol/L (ref 3.5–5.3)
Sodium: 139 mmol/L (ref 135–146)
Total Protein: 6.7 g/dL (ref 6.1–8.1)

## 2015-02-24 LAB — POCT URINALYSIS DIPSTICK
BILIRUBIN UA: NEGATIVE
Glucose, UA: NEGATIVE
Ketones, UA: NEGATIVE
Leukocytes, UA: NEGATIVE
Nitrite, UA: NEGATIVE
Protein, UA: NEGATIVE
RBC UA: NEGATIVE
UROBILINOGEN UA: NEGATIVE
pH, UA: 5.5

## 2015-02-24 LAB — LIPID PANEL
CHOL/HDL RATIO: 3.7 ratio (ref <=5.0)
Cholesterol: 200 mg/dL (ref 125–200)
HDL: 54 mg/dL (ref >=40)
LDL CALC: 131 mg/dL — AB (ref <130)
TRIGLYCERIDES: 76 mg/dL (ref <150)
VLDL: 15 mg/dL (ref <30)

## 2015-02-24 LAB — CBC
HCT: 43.9 % (ref 39.0–52.0)
Hemoglobin: 15.1 g/dL (ref 13.0–17.0)
MCH: 29.3 pg (ref 26.0–34.0)
MCHC: 34.4 g/dL (ref 30.0–36.0)
MCV: 85.2 fL (ref 78.0–100.0)
MPV: 10.7 fL (ref 8.6–12.4)
PLATELETS: 207 10*3/uL (ref 150–400)
RBC: 5.15 MIL/uL (ref 4.22–5.81)
RDW: 14.6 % (ref 11.5–15.5)
WBC: 4.4 10*3/uL (ref 4.0–10.5)

## 2015-02-24 LAB — VITAMIN B12: Vitamin B-12: 231 pg/mL (ref 211–911)

## 2015-02-24 NOTE — Telephone Encounter (Signed)
We will call back tomorrow with recommendations as well as print out of recommendations

## 2015-02-24 NOTE — Telephone Encounter (Signed)
Referral made to [facility] and appt scheduled on 03/07/15 at 2:50 with [facility] waiting on pt to return call

## 2015-02-24 NOTE — Patient Instructions (Signed)
Thank you for giving me the opportunity to serve you today.    Your diagnosis today includes: Encounter Diagnoses  Name Primary?  . Routine general medical examination at a health care facility   . Essential hypertension   . Hyperlipidemia   . Tobacco chew use   . History of kidney stones   . Noncompliance   . Skin lesion   . Paresthesia of left foot   . Advanced directives, counseling/discussion   . Vaccine counseling Yes     Recommendations: See your eye doctor yearly for routine vision care. See your dentist yearly for routine dental care including hygiene visits twice yearly. See [place] yearly for a physical  Hypertension  Your blood pressures here seem to be high.   I recommend we put you on daily blood pressure medication called Lisinopril.  Given your history of stroke, this is a strong recommendation to contort pressure and reduce stroke risks.   Please let me know if you are agreeable.    Your cholesterol is high and this also puts you at higher risk of stroke.  Similar to last year, the recommendation to help prevent strokes and control cholesterol is to be on a statin. I recommend we do this as well.  I recommend a daily Aspirin at bedtime also for stroke prevention  Vaccines: I recommend you get a yearly flu shot in the fall I recommend you have a Prevnar 13 pneumococcal vaccine NOW, and lets update the other pneumonia vaccine, Pneumococcal 23 next year I recommend a shingles vaccine  Toe pain/numbness I recommend you wear comfortable foot wear, making sure they are not too tight, and avoid prolonged squatting or sitting or standing in the same place for long periods  Skin lesion We will refer you to dermatology for evaluation  Advanced Directive: I recommend you complete advanced directives if you haven't done this prior.  Tobacco I recommend you don't use ANY tobacco products.    Hypertension Hypertension, commonly called high blood pressure, is when  the force of blood pumping through your arteries is too strong. Your arteries are the blood vessels that carry blood from your heart throughout your body. A blood pressure reading consists of a higher number over a lower number, such as 110/72. The higher number (systolic) is the pressure inside your arteries when your heart pumps. The lower number (diastolic) is the pressure inside your arteries when your heart relaxes. Ideally you want your blood pressure below 120/80. Hypertension forces your heart to work harder to pump blood. Your arteries may become narrow or stiff. Having untreated or uncontrolled hypertension can cause heart attack, stroke, kidney disease, and other problems. RISK FACTORS Some risk factors for high blood pressure are controllable. Others are not.  Risk factors you cannot control include:   Race. You may be at higher risk if you are African American.  Age. Risk increases with age.  Gender. Men are at higher risk than women before age 50 years. After age 69, women are at higher risk than men. Risk factors you can control include:  Not getting enough exercise or physical activity.  Being overweight.  Getting too much fat, sugar, calories, or salt in your diet.  Drinking too much alcohol. SIGNS AND SYMPTOMS Hypertension does not usually cause signs or symptoms. Extremely high blood pressure (hypertensive crisis) may cause headache, anxiety, shortness of breath, and nosebleed. DIAGNOSIS To check if you have hypertension, your health care provider will measure your blood pressure while you are seated,  with your arm held at the level of your heart. It should be measured at least twice using the same arm. Certain conditions can cause a difference in blood pressure between your right and left arms. A blood pressure reading that is higher than normal on one occasion does not mean that you need treatment. If it is not clear whether you have high blood pressure, you may be asked  to return on a different day to have your blood pressure checked again. Or, you may be asked to monitor your blood pressure at home for 1 or more weeks. TREATMENT Treating high blood pressure includes making lifestyle changes and possibly taking medicine. Living a healthy lifestyle can help lower high blood pressure. You may need to change some of your habits. Lifestyle changes may include:  Following the DASH diet. This diet is high in fruits, vegetables, and whole grains. It is low in salt, red meat, and added sugars.  Keep your sodium intake below 2,300 mg per day.  Getting at least 30-45 minutes of aerobic exercise at least 4 times per week.  Losing weight if necessary.  Not smoking.  Limiting alcoholic beverages.  Learning ways to reduce stress. Your health care provider may prescribe medicine if lifestyle changes are not enough to get your blood pressure under control, and if one of the following is true:  You are 68-42 years of age and your systolic blood pressure is above 140.  You are 85 years of age or older, and your systolic blood pressure is above 150.  Your diastolic blood pressure is above 90.  You have diabetes, and your systolic blood pressure is over [id number] or your diastolic blood pressure is over 90.  You have kidney disease and your blood pressure is above 140/90.  You have heart disease and your blood pressure is above 140/90. Your personal target blood pressure may vary depending on your medical conditions, your age, and other factors. HOME CARE INSTRUCTIONS  Have your blood pressure rechecked as directed by your health care provider.   Take medicines only as directed by your health care provider. Follow the directions carefully. Blood pressure medicines must be taken as prescribed. The medicine does not work as well when you skip doses. Skipping doses also puts you at risk for problems.  Do not smoke.   Monitor your blood pressure at home as directed by  your health care provider. SEEK MEDICAL CARE IF:   You think you are having a reaction to medicines taken.  You have recurrent headaches or feel dizzy.  You have swelling in your ankles.  You have trouble with your vision. SEEK IMMEDIATE MEDICAL CARE IF:  You develop a severe headache or confusion.  You have unusual weakness, numbness, or feel faint.  You have severe chest or abdominal pain.  You vomit repeatedly.  You have trouble breathing. MAKE SURE YOU:   Understand these instructions.  Will watch your condition.  Will get help right away if you are not doing well or get worse.   This information is not intended to replace advice given to you by your health care provider. Make sure you discuss any questions you have with your health care provider.   Document Released: 01/14/2005 Document Revised: 05/31/2014 Document Reviewed: 11/06/2012 Elsevier Interactive Patient Education 2016 [place] Directive Advance directives are the legal documents that allow you to make choices about your health care and medical treatment if you cannot speak for yourself. Advance directives are a  way for you to communicate your wishes to family, friends, and health care providers. The specified people can then convey your decisions about end-of-life care to avoid confusion if you should become unable to communicate. Ideally, the process of discussing and writing advance directives should happen over time rather than making decisions all at once. Advance directives can be modified as your situation changes, and you can change your mind at any time, even after you have signed the advance directives. Each state has its own laws regarding advance directives. You may want to check with your health care provider, attorney, or state representative about the law in your state. Below are some examples of advance directives. LIVING WILL A living will is a set of instructions documenting your  wishes about medical care when you cannot care for yourself. It is used if you become:  Terminally ill.  Incapacitated.  Unable to communicate.  Unable to make decisions. Items to consider in your living will include:  The use or non-use of life-sustaining equipment, such as dialysis machines and breathing machines (ventilators).  A do not resuscitate (DNR) order, which is the instruction not to use cardiopulmonary resuscitation (CPR) if breathing or heartbeat stops.  Tube feeding.  Withholding of food and fluids.  Comfort (palliative) care when the goal becomes comfort rather than a cure.  Organ and tissue donation. A living will does not give instructions about distribution of your money and property if you should pass away. It is advisable to seek the expert advice of a lawyer in drawing up a will regarding your possessions. Decisions about taxes, beneficiaries, and asset distribution will be legally binding. This process can relieve your family and friends of any burdens surrounding disputes or questions that may come up about the allocation of your assets. DO NOT RESUSCITATE (DNR) A do not resuscitate (DNR) order is a request to not have CPR in the event that your heart stops beating or you stop breathing. Unless given other instructions, a health care provider will try to help any patient whose heart has stopped or who has stopped breathing.  HEALTH CARE PROXY AND DURABLE POWER OF ATTORNEY FOR HEALTH CARE A health care proxy is a person (agent) appointed to make medical decisions for you if you cannot. Generally, people choose someone they know well and trust to represent their preferences when they can no longer do so. You should be sure to ask this person for agreement to act as your agent. An agent may have to exercise judgment in the event of a medical decision for which your wishes are not known. The durable power of attorney for health care is the legal document that names your  health care proxy. Once written, it should be:  Signed.  Notarized.  Dated.  Copied.  Witnessed.  Incorporated into your medical record. You may also want to appoint someone to manage your financial affairs if you cannot. This is called a durable power of attorney for finances. It is a separate legal document from the durable power of attorney for health care. You may choose the same person or someone different from your health care proxy to act as your agent in financial matters.   This information is not intended to replace advice given to you by your health care provider. Make sure you discuss any questions you have with your health care provider.   Document Released: 04/23/2007 Document Revised: 01/19/2013 Document Reviewed: 06/03/2012 Elsevier Interactive Patient Education 2016 [employer].   Stroke Prevention  Some medical conditions and behaviors are associated with an increased chance of having a stroke. You may prevent a stroke by making healthy choices and managing medical conditions. HOW CAN I REDUCE MY RISK OF HAVING A STROKE?   Stay physically active. Get at least 30 minutes of activity on most or all days.  Do not smoke. It may also be helpful to avoid exposure to secondhand smoke.  Limit alcohol use. Moderate alcohol use is considered to be:  No more than 2 drinks per day for men.  No more than 1 drink per day for nonpregnant women.  Eat healthy foods. This involves:  Eating 5 or more servings of fruits and vegetables a day.  Making dietary changes that address high blood pressure (hypertension), high cholesterol, diabetes, or obesity.  Manage your cholesterol levels.  Making food choices that are high in fiber and low in saturated fat, trans fat, and cholesterol may control cholesterol levels.  Take any prescribed medicines to control cholesterol as directed by your health care provider.  Manage your diabetes.  Controlling your carbohydrate and sugar  intake is recommended to manage diabetes.  Take any prescribed medicines to control diabetes as directed by your health care provider.  Control your hypertension.  Making food choices that are low in salt (sodium), saturated fat, trans fat, and cholesterol is recommended to manage hypertension.  Ask your health care provider if you need treatment to lower your blood pressure. Take any prescribed medicines to control hypertension as directed by your health care provider.  If you are 43-10 years of age, have your blood pressure checked every 3-5 years. If you are 52 years of age or older, have your blood pressure checked every year.  Maintain a healthy weight.  Reducing calorie intake and making food choices that are low in sodium, saturated fat, trans fat, and cholesterol are recommended to manage weight.  Stop drug abuse.  Avoid taking birth control pills.  Talk to your health care provider about the risks of taking birth control pills if you are over 57 years old, smoke, get migraines, or have ever had a blood clot.  Get evaluated for sleep disorders (sleep apnea).  Talk to your health care provider about getting a sleep evaluation if you snore a lot or have excessive sleepiness.  Take medicines only as directed by your health care provider.  For some people, aspirin or blood thinners (anticoagulants) are helpful in reducing the risk of forming abnormal blood clots that can lead to stroke. If you have the irregular heart rhythm of atrial fibrillation, you should be on a blood thinner unless there is a good reason you cannot take them.  Understand all your medicine instructions.  Make sure that other conditions (such as anemia or atherosclerosis) are addressed. SEEK IMMEDIATE MEDICAL CARE IF:   You have sudden weakness or numbness of the face, arm, or leg, especially on one side of the body.  Your face or eyelid droops to one side.  You have sudden confusion.  You have  trouble speaking (aphasia) or understanding.  You have sudden trouble seeing in one or both eyes.  You have sudden trouble walking.  You have dizziness.  You have a loss of balance or coordination.  You have a sudden, severe headache with no known cause.  You have new chest pain or an irregular heartbeat. Any of these symptoms may represent a serious problem that is an emergency. Do not wait to see if the symptoms will go  away. Get medical help at once. Call your local emergency services (911 in [place] Do not drive yourself to the hospital.   This information is not intended to replace advice given to you by your health care provider. Make sure you discuss any questions you have with your health care provider.   Document Released: 02/22/2004 Document Revised: 02/04/2014 Document Reviewed: 07/17/2012 Elsevier Interactive Patient Education [employer].

## 2015-02-24 NOTE — Telephone Encounter (Signed)
Pt called back and wants to know if you want him to continue taking the bay aspirin said if you think it is beneficial he will continue taking it cause of his blood pressure, said he will do whatever you think is best. Pt can be reached at [phone] said he should have asked when he was here this morning.

## 2015-02-24 NOTE — Progress Notes (Signed)
Subjective:   HPI  Justin [name] is a 65 y.o. male who presents for a complete physical.  No other routine care in the past year.    Medical team: Crisoforo Oxford, PA-C here for primary care Dr. Ardis Hughs, GI  reviewed nurse notes and assessments   Concerns: Not interested in taking medications.  He notes stopping the aspirin and Crestor last year, seems unwilling to use BP medications.  He notes lately left 2nd and 3rd toes get pain, burning pain and sometimes numbness.  Denies prior heavy alcohol use. No prior similar, no recent injury.    Has skin lesion concerns.  He does have fears of ED with BP medications .  Feels like his penis has shrunk some over the last few years.    Has 1 sexual partner, girlfriend, monogamous, no concern for STD, declines STD testing.   Reviewed their medical, surgical, family, social, medication, and allergy history and updated chart as appropriate.  Past Medical History  Diagnosis Date  . Calcium oxalate renal stones   . Rheumatic fever     age 2  . Chewing tobacco use   . Stroke ([place]) 10/02/2008  . Gallstones     hx/o, s/p choleycystectomy  . Hypertension     hx/o, noncompliance prior with medication  . Influenza vaccine refused 09/2013  . Tinnitus   . H/O echocardiogram 10/2008    no wall thickness, EF 55%, mild mitral regurge  . Hyperlipidemia     Past Surgical History  Procedure Laterality Date  . Rotator cuff repair      Right  . Dental implant    . Blood clot  1997    brain  . Cholecystectomy  09/06/2011    Procedure: LAPAROSCOPIC CHOLECYSTECTOMY WITH INTRAOPERATIVE CHOLANGIOGRAM;  Surgeon: Earnstine Regal, MD;  Location: [facility];  Service: General;  Laterality: N/A;  . Joint replacement  2004    LEFT BICEP REPAIR, RTC repair  . Sigmoidoscopy    . Brain surgery  1997    craniotomy, evacuation of hematoma  . Colonoscopy  01/04/14    normal. Dr. Owens Loffler    Social History   Social History  . Marital Status: Single     Spouse Name: N/A  . Number of Children: 2  . Years of Education: N/A   Occupational History  .  [employer]   Social History Main Topics  . Smoking status: Never Smoker   . Smokeless tobacco: Current User    Types: Chew  . Alcohol Use: No  . Drug Use: No  . Sexual Activity: Not on file   Other Topics Concern  . Not on file   Social History Narrative   Single, has 2 sons, 34yo and 66yo, electrician, walks regularly, 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      Family History  Problem Relation Age of Onset  . Heart disease Mother 40    MI  . Migraines Mother   . Colon cancer Neg Hx   . Cancer Neg Hx   . Stroke Neg Hx   . Hypertension Neg Hx   . Hyperlipidemia Neg Hx   . Cirrhosis Brother   . Other Father     unknown  . Diabetes Brother     No current outpatient prescriptions on file.  No Known Allergies   Review of Systems Constitutional: -fever, -chills, -sweats, -unexpected weight change, -decreased appetite, -fatigue Allergy: -sneezing, -itching, -congestion Dermatology: -changing moles, --rash, -lumps ENT: -runny nose, -ear  pain, -sore throat, -hoarseness, -sinus pain, -teeth pain, - ringing in ears, -hearing loss, -nosebleeds Cardiology: -chest pain, -palpitations, -swelling, -difficulty breathing when lying flat, -waking up short of breath Respiratory: -cough, -shortness of breath, -difficulty breathing with exercise or exertion, -wheezing, -coughing up blood Gastroenterology: -abdominal pain, -nausea, -vomiting, -diarrhea, -constipation, -blood in stool, -changes in bowel movement, -difficulty swallowing or eating Hematology: -bleeding, -bruising  Musculoskeletal: -joint aches, -muscle aches, -joint swelling, -back pain, -neck pain, -cramping, -changes in gait Ophthalmology: denies vision changes, eye redness, itching, discharge Urology: +penis seems smaller than prior, -burning with urination, -difficulty urinating, -blood in urine, -urinary  frequency, -urgency, -incontinence Neurology: +tinnitus ongoing for years, -headache, -weakness, -tingling, -numbness, -memory loss, -falls, -dizziness Psychology: -depressed mood, -agitation, -sleep problems     Objective:   Physical Exam  BP 140/90 mmHg  Pulse 70  Ht 5\' 7"  (1.702 m)  Wt 163 lb (73.936 kg)  BMI 25.52 kg/m2  SpO2 97%  General appearance: alert, no distress, WD/WN, white male Skin: left neck anteriorly with 19mm raised purplish lesion, right upper chest just under clavicle proximiale 1/3 with 15mm raised somewhat crusted red/yellow lesion, scattered macules throughout, no particularly worrisome lesions, linear callous of right lateral 2nd finger from fishing pole position, no other worrisome lesions HEENT: normocephalic, conjunctiva/corneas normal, sclerae anicteric, PERRLA, EOMi, nares patent, no discharge or erythema, pharynx normal Oral cavity: MMM, tongue normal, teeth - in good repair, lower implant, questionable white patches in left buccal mucosa and tongue Neck: supple, no lymphadenopathy, no thyromegaly, no masses, normal ROM, no bruits Chest: non tender, normal shape and expansion Heart: RRR, normal S1, S2, no murmurs Lungs: CTA bilaterally, no wheezes, rhonchi, or rales Abdomen: +bs, RUQ port surgical scars, soft, non tender, non distended, no masses, no hepatomegaly, no splenomegaly, no bruits Back: non tender, normal ROM, no scoliosis Musculoskeletal: left shoulder surgical scars,otherwise upper extremities non tender, no obvious deformity, normal ROM throughout, lower extremities non tender, no obvious deformity, normal ROM throughout Extremities: no edema, no cyanosis, no clubbing Pulses: 2+ symmetric, upper and lower extremities, normal cap refill Neurological: alert, oriented x 3, CN2-12 intact, strength normal upper extremities and lower extremities, sensation normal throughout, DTRs 2+ throughout, no cerebellar signs, gait normal Psychiatric: normal  affect, behavior normal, pleasant  GU: normal male external genitalia, circumcised, nontender, no masses, no hernia, no lymphadenopathy Rectal: deferred    Assessment and Plan :   Encounter Diagnoses  Name Primary?  . Routine general medical examination at a health care facility Yes  . Essential hypertension   . Hyperlipidemia   . Tobacco chew use   . History of kidney stones   . Noncompliance   . Skin lesion   . Paresthesia of left foot   . Advanced directives, counseling/discussion   . Vaccine counseling     Physical exam - discussed healthy lifestyle, diet, exercise, preventative care, vaccinations, and addressed their concerns.    Hx/o CVA - discussed recommends for secondary stroke prevention.  Discussed aspirin therapy, statin, BP goals, LDL goals.  He doesn't seem very open to any of the recommendations.  HTN - discussed goal of 120/80 or less.    Smokeless tobacco - advised cessation.  He is not ready to quit.  Advised soon f/u with dentist given white patches in buccal mucosa and tongue  Toe pain, paresthesias - f/u pending labs  Advised he check insurance coverage for shingles vaccine.  Will refer to dermatology for skin lesions  Discussed vaccine recommendations  Follow-up pending  labs   Recommendations: See your eye doctor yearly for routine vision care. See your dentist yearly for routine dental care including hygiene visits twice yearly. See [place] yearly for a physical  Hypertension  Your blood pressures here seem to be high.   I recommend we put you on daily blood pressure medication called Lisinopril.  Given your history of stroke, this is a strong recommendation to contort pressure and reduce stroke risks.   Please let me know if you are agreeable.    Your cholesterol is high and this also puts you at higher risk of stroke.  Similar to last year, the recommendation to help prevent strokes and control cholesterol is to be on a statin. I recommend we do  this as well.  I recommend a daily Aspirin at bedtime also for stroke prevention  Vaccines: I recommend you get a yearly flu shot in the fall I recommend you have a Prevnar 13 pneumococcal vaccine NOW, and lets update the other pneumonia vaccine, Pneumococcal 23 next year I recommend a shingles vaccine  Toe pain/numbness I recommend you wear comfortable foot wear, making sure they are not too tight, and avoid prolonged squatting or sitting or standing in the same place for long periods  Skin lesion We will refer you to dermatology for evaluation  Advanced Directive: I recommend you complete advanced directives if you haven't done this prior.  Tobacco I recommend you don't use ANY tobacco products.   During the course of the visit the patient was educated and counseled about appropriate screening and preventive services including:    Pneumococcal vaccine   Influenza vaccine  Screening electrocardiogram  Colorectal cancer screening  Diabetes screening  Glaucoma screening  Nutrition counseling   Smoking cessation counseling  Advanced directives: counseled on completing these forms  Medicare Attestation I have personally reviewed: The patient's medical and social history Their use of alcohol, tobacco or illicit drugs Their current medications and supplements The patient's functional ability including ADLs,fall risks, home safety risks, cognitive, and hearing and visual impairment Diet and physical activities Evidence for depression or mood disorders  The patient's weight, height, BMI, and visual acuity have been recorded in the chart.  I have made referrals, counseling, and provided education to the patient based on review of the above and I have provided the patient with a written personalized care plan for preventive services.     Crisoforo Oxford, PA-C   02/24/2015

## 2015-02-24 NOTE — Telephone Encounter (Signed)
Refer to dermatology and send him copy of the AV summary

## 2015-02-25 LAB — HEMOGLOBIN A1C
HEMOGLOBIN A1C: 6.5 % — AB (ref <5.7)
Mean Plasma Glucose: 140 mg/dL — ABNORMAL HIGH (ref <117)

## 2015-02-27 ENCOUNTER — Telehealth: Payer: Self-pay

## 2015-02-27 NOTE — Telephone Encounter (Signed)
Pt is aware.  

## 2015-02-27 NOTE — Telephone Encounter (Signed)
Pt wanted to know if you wanted him to take a baby aspirin again?

## 2015-02-27 NOTE — Telephone Encounter (Signed)
Pt called to ask about exercising. Reminded me about not being a pill person.

## 2015-03-13 ENCOUNTER — Telehealth: Payer: Self-pay

## 2015-03-13 NOTE — Telephone Encounter (Signed)
Pt called to talk about why he needed to be on pravachol he thought this was blood pressure medication. Called pt back to discuss LMTCB

## 2015-03-15 NOTE — Telephone Encounter (Signed)
Pt was confused about why we recommended the medication again. So we went back over his labs. Stated he is changing his diet and exercising and will call back if he changes his mind about the medication but is taking aspirin

## 2015-07-03 DIAGNOSIS — M9904 Segmental and somatic dysfunction of sacral region: Secondary | ICD-10-CM | POA: Diagnosis not present

## 2015-07-03 DIAGNOSIS — M9903 Segmental and somatic dysfunction of lumbar region: Secondary | ICD-10-CM | POA: Diagnosis not present

## 2015-07-03 DIAGNOSIS — M546 Pain in thoracic spine: Secondary | ICD-10-CM | POA: Diagnosis not present

## 2015-07-03 DIAGNOSIS — M9902 Segmental and somatic dysfunction of thoracic region: Secondary | ICD-10-CM | POA: Diagnosis not present

## 2015-07-03 DIAGNOSIS — M545 Low back pain: Secondary | ICD-10-CM | POA: Diagnosis not present

## 2015-07-03 DIAGNOSIS — M5137 Other intervertebral disc degeneration, lumbosacral region: Secondary | ICD-10-CM | POA: Diagnosis not present

## 2015-07-05 DIAGNOSIS — M9903 Segmental and somatic dysfunction of lumbar region: Secondary | ICD-10-CM | POA: Diagnosis not present

## 2015-07-05 DIAGNOSIS — M9902 Segmental and somatic dysfunction of thoracic region: Secondary | ICD-10-CM | POA: Diagnosis not present

## 2015-07-05 DIAGNOSIS — M545 Low back pain: Secondary | ICD-10-CM | POA: Diagnosis not present

## 2015-07-05 DIAGNOSIS — M546 Pain in thoracic spine: Secondary | ICD-10-CM | POA: Diagnosis not present

## 2015-07-05 DIAGNOSIS — M5137 Other intervertebral disc degeneration, lumbosacral region: Secondary | ICD-10-CM | POA: Diagnosis not present

## 2015-07-05 DIAGNOSIS — M9904 Segmental and somatic dysfunction of sacral region: Secondary | ICD-10-CM | POA: Diagnosis not present

## 2015-07-12 DIAGNOSIS — M5137 Other intervertebral disc degeneration, lumbosacral region: Secondary | ICD-10-CM | POA: Diagnosis not present

## 2015-07-12 DIAGNOSIS — M546 Pain in thoracic spine: Secondary | ICD-10-CM | POA: Diagnosis not present

## 2015-07-12 DIAGNOSIS — M9902 Segmental and somatic dysfunction of thoracic region: Secondary | ICD-10-CM | POA: Diagnosis not present

## 2015-07-12 DIAGNOSIS — M545 Low back pain: Secondary | ICD-10-CM | POA: Diagnosis not present

## 2015-07-12 DIAGNOSIS — M9903 Segmental and somatic dysfunction of lumbar region: Secondary | ICD-10-CM | POA: Diagnosis not present

## 2015-07-12 DIAGNOSIS — M9904 Segmental and somatic dysfunction of sacral region: Secondary | ICD-10-CM | POA: Diagnosis not present

## 2015-07-14 DIAGNOSIS — M5137 Other intervertebral disc degeneration, lumbosacral region: Secondary | ICD-10-CM | POA: Diagnosis not present

## 2015-07-14 DIAGNOSIS — M9903 Segmental and somatic dysfunction of lumbar region: Secondary | ICD-10-CM | POA: Diagnosis not present

## 2015-07-14 DIAGNOSIS — M545 Low back pain: Secondary | ICD-10-CM | POA: Diagnosis not present

## 2015-07-14 DIAGNOSIS — M9902 Segmental and somatic dysfunction of thoracic region: Secondary | ICD-10-CM | POA: Diagnosis not present

## 2015-07-14 DIAGNOSIS — M546 Pain in thoracic spine: Secondary | ICD-10-CM | POA: Diagnosis not present

## 2015-07-14 DIAGNOSIS — M9904 Segmental and somatic dysfunction of sacral region: Secondary | ICD-10-CM | POA: Diagnosis not present

## 2015-07-17 DIAGNOSIS — M9902 Segmental and somatic dysfunction of thoracic region: Secondary | ICD-10-CM | POA: Diagnosis not present

## 2015-07-17 DIAGNOSIS — M5137 Other intervertebral disc degeneration, lumbosacral region: Secondary | ICD-10-CM | POA: Diagnosis not present

## 2015-07-17 DIAGNOSIS — M9903 Segmental and somatic dysfunction of lumbar region: Secondary | ICD-10-CM | POA: Diagnosis not present

## 2015-07-17 DIAGNOSIS — M545 Low back pain: Secondary | ICD-10-CM | POA: Diagnosis not present

## 2015-07-17 DIAGNOSIS — M9904 Segmental and somatic dysfunction of sacral region: Secondary | ICD-10-CM | POA: Diagnosis not present

## 2015-07-17 DIAGNOSIS — M546 Pain in thoracic spine: Secondary | ICD-10-CM | POA: Diagnosis not present

## 2015-07-20 DIAGNOSIS — M9904 Segmental and somatic dysfunction of sacral region: Secondary | ICD-10-CM | POA: Diagnosis not present

## 2015-07-20 DIAGNOSIS — M9902 Segmental and somatic dysfunction of thoracic region: Secondary | ICD-10-CM | POA: Diagnosis not present

## 2015-07-20 DIAGNOSIS — M546 Pain in thoracic spine: Secondary | ICD-10-CM | POA: Diagnosis not present

## 2015-07-20 DIAGNOSIS — M545 Low back pain: Secondary | ICD-10-CM | POA: Diagnosis not present

## 2015-07-20 DIAGNOSIS — M9903 Segmental and somatic dysfunction of lumbar region: Secondary | ICD-10-CM | POA: Diagnosis not present

## 2015-07-20 DIAGNOSIS — M5137 Other intervertebral disc degeneration, lumbosacral region: Secondary | ICD-10-CM | POA: Diagnosis not present

## 2015-07-27 DIAGNOSIS — M546 Pain in thoracic spine: Secondary | ICD-10-CM | POA: Diagnosis not present

## 2015-07-27 DIAGNOSIS — M9902 Segmental and somatic dysfunction of thoracic region: Secondary | ICD-10-CM | POA: Diagnosis not present

## 2015-07-27 DIAGNOSIS — M5137 Other intervertebral disc degeneration, lumbosacral region: Secondary | ICD-10-CM | POA: Diagnosis not present

## 2015-07-27 DIAGNOSIS — M9904 Segmental and somatic dysfunction of sacral region: Secondary | ICD-10-CM | POA: Diagnosis not present

## 2015-07-27 DIAGNOSIS — M545 Low back pain: Secondary | ICD-10-CM | POA: Diagnosis not present

## 2015-07-27 DIAGNOSIS — M9903 Segmental and somatic dysfunction of lumbar region: Secondary | ICD-10-CM | POA: Diagnosis not present

## 2015-08-02 DIAGNOSIS — M546 Pain in thoracic spine: Secondary | ICD-10-CM | POA: Diagnosis not present

## 2015-08-02 DIAGNOSIS — M9904 Segmental and somatic dysfunction of sacral region: Secondary | ICD-10-CM | POA: Diagnosis not present

## 2015-08-02 DIAGNOSIS — M9902 Segmental and somatic dysfunction of thoracic region: Secondary | ICD-10-CM | POA: Diagnosis not present

## 2015-08-02 DIAGNOSIS — M9903 Segmental and somatic dysfunction of lumbar region: Secondary | ICD-10-CM | POA: Diagnosis not present

## 2015-08-02 DIAGNOSIS — M5137 Other intervertebral disc degeneration, lumbosacral region: Secondary | ICD-10-CM | POA: Diagnosis not present

## 2015-08-02 DIAGNOSIS — M545 Low back pain: Secondary | ICD-10-CM | POA: Diagnosis not present

## 2015-08-09 DIAGNOSIS — M545 Low back pain: Secondary | ICD-10-CM | POA: Diagnosis not present

## 2015-08-09 DIAGNOSIS — M9903 Segmental and somatic dysfunction of lumbar region: Secondary | ICD-10-CM | POA: Diagnosis not present

## 2015-08-09 DIAGNOSIS — M5137 Other intervertebral disc degeneration, lumbosacral region: Secondary | ICD-10-CM | POA: Diagnosis not present

## 2015-08-09 DIAGNOSIS — M9902 Segmental and somatic dysfunction of thoracic region: Secondary | ICD-10-CM | POA: Diagnosis not present

## 2015-08-09 DIAGNOSIS — M546 Pain in thoracic spine: Secondary | ICD-10-CM | POA: Diagnosis not present

## 2015-08-09 DIAGNOSIS — M9904 Segmental and somatic dysfunction of sacral region: Secondary | ICD-10-CM | POA: Diagnosis not present

## 2015-08-16 DIAGNOSIS — M546 Pain in thoracic spine: Secondary | ICD-10-CM | POA: Diagnosis not present

## 2015-08-16 DIAGNOSIS — M545 Low back pain: Secondary | ICD-10-CM | POA: Diagnosis not present

## 2015-08-16 DIAGNOSIS — M9902 Segmental and somatic dysfunction of thoracic region: Secondary | ICD-10-CM | POA: Diagnosis not present

## 2015-08-16 DIAGNOSIS — M9904 Segmental and somatic dysfunction of sacral region: Secondary | ICD-10-CM | POA: Diagnosis not present

## 2015-08-16 DIAGNOSIS — M5137 Other intervertebral disc degeneration, lumbosacral region: Secondary | ICD-10-CM | POA: Diagnosis not present

## 2015-08-16 DIAGNOSIS — M9903 Segmental and somatic dysfunction of lumbar region: Secondary | ICD-10-CM | POA: Diagnosis not present

## 2015-08-23 DIAGNOSIS — M9903 Segmental and somatic dysfunction of lumbar region: Secondary | ICD-10-CM | POA: Diagnosis not present

## 2015-08-23 DIAGNOSIS — M546 Pain in thoracic spine: Secondary | ICD-10-CM | POA: Diagnosis not present

## 2015-08-23 DIAGNOSIS — M545 Low back pain: Secondary | ICD-10-CM | POA: Diagnosis not present

## 2015-08-23 DIAGNOSIS — M9904 Segmental and somatic dysfunction of sacral region: Secondary | ICD-10-CM | POA: Diagnosis not present

## 2015-08-23 DIAGNOSIS — M9902 Segmental and somatic dysfunction of thoracic region: Secondary | ICD-10-CM | POA: Diagnosis not present

## 2015-08-23 DIAGNOSIS — M5137 Other intervertebral disc degeneration, lumbosacral region: Secondary | ICD-10-CM | POA: Diagnosis not present

## 2015-09-06 DIAGNOSIS — M9902 Segmental and somatic dysfunction of thoracic region: Secondary | ICD-10-CM | POA: Diagnosis not present

## 2015-09-06 DIAGNOSIS — M545 Low back pain: Secondary | ICD-10-CM | POA: Diagnosis not present

## 2015-09-06 DIAGNOSIS — M9904 Segmental and somatic dysfunction of sacral region: Secondary | ICD-10-CM | POA: Diagnosis not present

## 2015-09-06 DIAGNOSIS — M546 Pain in thoracic spine: Secondary | ICD-10-CM | POA: Diagnosis not present

## 2015-09-06 DIAGNOSIS — M5137 Other intervertebral disc degeneration, lumbosacral region: Secondary | ICD-10-CM | POA: Diagnosis not present

## 2015-09-06 DIAGNOSIS — M9903 Segmental and somatic dysfunction of lumbar region: Secondary | ICD-10-CM | POA: Diagnosis not present

## 2015-10-12 DIAGNOSIS — M9903 Segmental and somatic dysfunction of lumbar region: Secondary | ICD-10-CM | POA: Diagnosis not present

## 2015-10-12 DIAGNOSIS — M5137 Other intervertebral disc degeneration, lumbosacral region: Secondary | ICD-10-CM | POA: Diagnosis not present

## 2015-10-12 DIAGNOSIS — M9904 Segmental and somatic dysfunction of sacral region: Secondary | ICD-10-CM | POA: Diagnosis not present

## 2015-10-12 DIAGNOSIS — M9902 Segmental and somatic dysfunction of thoracic region: Secondary | ICD-10-CM | POA: Diagnosis not present

## 2015-10-12 DIAGNOSIS — M545 Low back pain: Secondary | ICD-10-CM | POA: Diagnosis not present

## 2015-10-12 DIAGNOSIS — M546 Pain in thoracic spine: Secondary | ICD-10-CM | POA: Diagnosis not present

## 2015-11-09 DIAGNOSIS — M546 Pain in thoracic spine: Secondary | ICD-10-CM | POA: Diagnosis not present

## 2015-11-09 DIAGNOSIS — M9902 Segmental and somatic dysfunction of thoracic region: Secondary | ICD-10-CM | POA: Diagnosis not present

## 2015-11-09 DIAGNOSIS — M9903 Segmental and somatic dysfunction of lumbar region: Secondary | ICD-10-CM | POA: Diagnosis not present

## 2015-11-09 DIAGNOSIS — M545 Low back pain: Secondary | ICD-10-CM | POA: Diagnosis not present

## 2015-11-09 DIAGNOSIS — M5137 Other intervertebral disc degeneration, lumbosacral region: Secondary | ICD-10-CM | POA: Diagnosis not present

## 2015-11-09 DIAGNOSIS — M9904 Segmental and somatic dysfunction of sacral region: Secondary | ICD-10-CM | POA: Diagnosis not present

## 2015-12-04 DIAGNOSIS — M9903 Segmental and somatic dysfunction of lumbar region: Secondary | ICD-10-CM | POA: Diagnosis not present

## 2015-12-04 DIAGNOSIS — M9904 Segmental and somatic dysfunction of sacral region: Secondary | ICD-10-CM | POA: Diagnosis not present

## 2015-12-04 DIAGNOSIS — M546 Pain in thoracic spine: Secondary | ICD-10-CM | POA: Diagnosis not present

## 2015-12-04 DIAGNOSIS — M5137 Other intervertebral disc degeneration, lumbosacral region: Secondary | ICD-10-CM | POA: Diagnosis not present

## 2015-12-04 DIAGNOSIS — M9902 Segmental and somatic dysfunction of thoracic region: Secondary | ICD-10-CM | POA: Diagnosis not present

## 2015-12-04 DIAGNOSIS — M545 Low back pain: Secondary | ICD-10-CM | POA: Diagnosis not present

## 2016-01-01 DIAGNOSIS — M545 Low back pain: Secondary | ICD-10-CM | POA: Diagnosis not present

## 2016-01-01 DIAGNOSIS — M546 Pain in thoracic spine: Secondary | ICD-10-CM | POA: Diagnosis not present

## 2016-01-01 DIAGNOSIS — M9904 Segmental and somatic dysfunction of sacral region: Secondary | ICD-10-CM | POA: Diagnosis not present

## 2016-01-01 DIAGNOSIS — M5137 Other intervertebral disc degeneration, lumbosacral region: Secondary | ICD-10-CM | POA: Diagnosis not present

## 2016-01-01 DIAGNOSIS — M9902 Segmental and somatic dysfunction of thoracic region: Secondary | ICD-10-CM | POA: Diagnosis not present

## 2016-01-01 DIAGNOSIS — M9903 Segmental and somatic dysfunction of lumbar region: Secondary | ICD-10-CM | POA: Diagnosis not present

## 2016-02-01 DIAGNOSIS — M9902 Segmental and somatic dysfunction of thoracic region: Secondary | ICD-10-CM | POA: Diagnosis not present

## 2016-02-01 DIAGNOSIS — M9904 Segmental and somatic dysfunction of sacral region: Secondary | ICD-10-CM | POA: Diagnosis not present

## 2016-02-01 DIAGNOSIS — M5137 Other intervertebral disc degeneration, lumbosacral region: Secondary | ICD-10-CM | POA: Diagnosis not present

## 2016-02-01 DIAGNOSIS — M545 Low back pain: Secondary | ICD-10-CM | POA: Diagnosis not present

## 2016-02-01 DIAGNOSIS — M546 Pain in thoracic spine: Secondary | ICD-10-CM | POA: Diagnosis not present

## 2016-02-01 DIAGNOSIS — M9903 Segmental and somatic dysfunction of lumbar region: Secondary | ICD-10-CM | POA: Diagnosis not present

## 2016-03-29 DIAGNOSIS — M9902 Segmental and somatic dysfunction of thoracic region: Secondary | ICD-10-CM | POA: Diagnosis not present

## 2016-03-29 DIAGNOSIS — M546 Pain in thoracic spine: Secondary | ICD-10-CM | POA: Diagnosis not present

## 2016-03-29 DIAGNOSIS — M9903 Segmental and somatic dysfunction of lumbar region: Secondary | ICD-10-CM | POA: Diagnosis not present

## 2016-03-29 DIAGNOSIS — M5137 Other intervertebral disc degeneration, lumbosacral region: Secondary | ICD-10-CM | POA: Diagnosis not present

## 2016-03-29 DIAGNOSIS — M545 Low back pain: Secondary | ICD-10-CM | POA: Diagnosis not present

## 2016-03-29 DIAGNOSIS — M9904 Segmental and somatic dysfunction of sacral region: Secondary | ICD-10-CM | POA: Diagnosis not present

## 2016-04-01 ENCOUNTER — Ambulatory Visit (INDEPENDENT_AMBULATORY_CARE_PROVIDER_SITE_OTHER): Payer: Medicare HMO | Admitting: Medical

## 2016-04-01 ENCOUNTER — Encounter: Payer: Self-pay | Admitting: Medical

## 2016-04-01 VITALS — BP 142/88 | HR 71 | Ht 66.5 in | Wt 155.8 lb

## 2016-04-01 DIAGNOSIS — I10 Essential (primary) hypertension: Secondary | ICD-10-CM | POA: Diagnosis not present

## 2016-04-01 DIAGNOSIS — Z720 Tobacco use: Secondary | ICD-10-CM

## 2016-04-01 DIAGNOSIS — L84 Corns and callosities: Secondary | ICD-10-CM

## 2016-04-01 DIAGNOSIS — Z2820 Immunization not carried out because of patient decision for unspecified reason: Secondary | ICD-10-CM | POA: Insufficient documentation

## 2016-04-01 DIAGNOSIS — E785 Hyperlipidemia, unspecified: Secondary | ICD-10-CM | POA: Diagnosis not present

## 2016-04-01 DIAGNOSIS — Z7189 Other specified counseling: Secondary | ICD-10-CM

## 2016-04-01 DIAGNOSIS — R7301 Impaired fasting glucose: Secondary | ICD-10-CM | POA: Insufficient documentation

## 2016-04-01 DIAGNOSIS — R252 Cramp and spasm: Secondary | ICD-10-CM | POA: Diagnosis not present

## 2016-04-01 DIAGNOSIS — Z0000 Encounter for general adult medical examination without abnormal findings: Secondary | ICD-10-CM

## 2016-04-01 DIAGNOSIS — Z125 Encounter for screening for malignant neoplasm of prostate: Secondary | ICD-10-CM | POA: Diagnosis not present

## 2016-04-01 DIAGNOSIS — Z87442 Personal history of urinary calculi: Secondary | ICD-10-CM | POA: Diagnosis not present

## 2016-04-01 DIAGNOSIS — Z7185 Encounter for immunization safety counseling: Secondary | ICD-10-CM

## 2016-04-01 DIAGNOSIS — Z9119 Patient's noncompliance with other medical treatment and regimen: Secondary | ICD-10-CM | POA: Diagnosis not present

## 2016-04-01 DIAGNOSIS — Z91199 Patient's noncompliance with other medical treatment and regimen due to unspecified reason: Secondary | ICD-10-CM

## 2016-04-01 LAB — COMPREHENSIVE METABOLIC PANEL
ALT: 14 U/L (ref 9–46)
AST: 18 U/L (ref 10–35)
Albumin: 4.4 g/dL (ref 3.6–5.1)
Alkaline Phosphatase: 77 U/L (ref 40–115)
BUN: 12 mg/dL (ref 7–25)
CHLORIDE: 105 mmol/L (ref 98–110)
CO2: 27 mmol/L (ref 20–31)
CREATININE: 0.85 mg/dL (ref 0.70–1.25)
Calcium: 9.6 mg/dL (ref 8.6–10.3)
GLUCOSE: 80 mg/dL (ref 65–99)
Potassium: 3.9 mmol/L (ref 3.5–5.3)
SODIUM: 140 mmol/L (ref 135–146)
Total Bilirubin: 0.4 mg/dL (ref 0.2–1.2)
Total Protein: 6.9 g/dL (ref 6.1–8.1)

## 2016-04-01 LAB — URIC ACID: Uric Acid, Serum: 4.5 mg/dL (ref 4.0–8.0)

## 2016-04-01 LAB — PSA: PSA: 1.4 ng/mL (ref <=4.0)

## 2016-04-01 LAB — LIPID PANEL
Cholesterol: 204 mg/dL — ABNORMAL HIGH (ref <200)
HDL: 53 mg/dL (ref >40)
LDL CALC: 121 mg/dL — AB (ref <100)
Total CHOL/HDL Ratio: 3.8 Ratio (ref <5.0)
Triglycerides: 149 mg/dL (ref <150)
VLDL: 30 mg/dL (ref <30)

## 2016-04-01 LAB — HEMOGLOBIN A1C
Hgb A1c MFr Bld: 6 % — ABNORMAL HIGH (ref <5.7)
MEAN PLASMA GLUCOSE: 126 mg/dL

## 2016-04-01 NOTE — Progress Notes (Addendum)
Subjective:    Justin [name] is a 66 y.o. male who presents for Preventative Services visit and chronic medical problems/med check visit.    Primary Care Provider [place], Isabel Ardila [facility], PA-C here for primary care  Will be gone over the next month fishing in [place].  Girlfriend has house on [address].   Been dating 10 years.  Has 66 yo granddaughter.  Uncles lived to 67s.  Dad was a drunk but lived to 83s.     Current Health Care Team:  Dentist, Dr. Edsel Petrin  Eye doctor can't remember his name   Sees a chiropractor  Medical Services you may have received from other than [facility] providers in the past year (date may be approximate) Dr.Randy Troutman for chiropractor services  Exercise Current exercise habits: walking bike riding   Nutrition/Diet Current diet: no discretion  Depression Screen Depression screen [facility] 2/9 04/01/2016  Decreased Interest 0  Down, Depressed, Hopeless 0  PHQ - 2 Score 0    Activities of Daily Living Screen/Functional Status Survey Is the patient deaf or have difficulty hearing?: No Does the patient have difficulty seeing, even when wearing glasses/contacts?: No Does the patient have difficulty concentrating, remembering, or making decisions?: No Does the patient have difficulty walking or climbing stairs?: No Does the patient have difficulty dressing or bathing?: No Does the patient have difficulty doing errands alone such as visiting a doctor's office or shopping?: No  Can patient draw a clock face showing 3:15 o'clock, yes  Fall Risk Screen Fall Risk  04/01/2016 02/24/2015  Falls in the past year? No No    Gait Assessment: Normal gait observed yes  Advanced directives Does patient have a [place]? no  Does patient have a Living Will? no  Past Medical History:  Diagnosis Date  . Calcium oxalate renal stones   . Chewing tobacco use   . Gallstones    hx/o, s/p choleycystectomy  . H/O echocardiogram 10/2008   no wall thickness, EF 55%, mild mitral regurge  . Hyperlipidemia   . Hypertension    hx/o, noncompliance prior with medication  . Influenza vaccine refused 09/2013  . Rheumatic fever    age 38  . Stroke ([place]) 10/02/2008  . Tinnitus     Past Surgical History:  Procedure Laterality Date  . blood clot  1997   brain  . BRAIN SURGERY  1997   craniotomy, evacuation of hematoma  . CHOLECYSTECTOMY  09/06/2011   Procedure: LAPAROSCOPIC CHOLECYSTECTOMY WITH INTRAOPERATIVE CHOLANGIOGRAM;  Surgeon: Earnstine Regal, MD;  Location: [facility];  Service: General;  Laterality: N/A;  . COLONOSCOPY  01/04/14   normal. Dr. Owens Loffler  . Dental implant    . JOINT REPLACEMENT  2004   LEFT BICEP REPAIR, RTC repair  . ROTATOR CUFF REPAIR     Right  . SIGMOIDOSCOPY      Social History   Social History  . Marital status: Single    Spouse name: N/A  . Number of children: 2  . Years of education: N/A   Occupational History  .  [employer]   Social History Main Topics  . Smoking status: Never Smoker  . Smokeless tobacco: Current User    Types: Chew  . Alcohol use No  . Drug use: No  . Sexual activity: Not on file   Other Topics Concern  . Not on file   Social History Narrative   Single, has 2 sons, 53yo and 67yo, electrician, walks regularly,  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      Family History  Problem Relation Age of Onset  . Heart disease Mother 21    MI  . Migraines Mother   . Cirrhosis Brother   . Alcohol abuse Father     lived to age 30  . Diabetes Brother   . Colon cancer Neg Hx   . Cancer Neg Hx   . Stroke Neg Hx   . Hypertension Neg Hx   . Hyperlipidemia Neg Hx     No current outpatient prescriptions on file.  No Known Allergies  History reviewed: allergies, current medications, past family history, past medical history, past social history, past surgical history and problem list  Chronic issues discussed: Noncompliant with recommendations for BP, hx/o  stroke.   Has been adamant about not taking medications.  Acute issues discussed: None, doing fine  However, he notes a week before Valentines had 5 days of problems with diarrhea, stomach bug.  Had some nausea, couldn't keep anything down, vomiting.  No URI symptoms.   symptom resolved after 5 days.  Girlfriend had same but her's only lasted 2 days.  Objective:   Biometrics BP (!) 142/88   Pulse 71   Ht 5' 6.5" (1.689 m)   Wt 155 lb 12.8 oz (70.7 kg)   SpO2 99%   BMI 24.77 kg/m   BP Readings from Last 3 Encounters:  04/01/16 (!) 142/88  02/24/15 140/90  09/01/14 169/92   Cognitive Testing  Alert? Yes  Normal Appearance?Yes  Oriented to person? Yes  Place? Yes   Time? Yes  Recall of three objects?  Yes  Can perform simple calculations? Yes  Displays appropriate judgment?Yes  Can read the correct time from a watch face?Yes  General appearance: alert, no distress, WD/WN, white male  Nutritional Status: Inadequate calore intake? no Loss of muscle mass? no Loss of fat beneath skin? no Localized or general edema? no Diminished functional status? no  Other pertinent exam: HEENT: normocephalic, sclerae anicteric, TMs pearly, nares patent, no discharge or erythema, pharynx normal Left lateral foot over lateral MTP region with 73mm diameter corn, few scattered macules, no worrisome lesions Oral cavity: MMM, no lesions Neck: supple, no lymphadenopathy, no thyromegaly, no masses, no bruits Heart: RRR, normal S1, S2, no murmurs Lungs: CTA bilaterally, no wheezes, rhonchi, or rales Abdomen: +bs, soft, non tender, non distended, no masses, no hepatomegaly, no splenomegaly Musculoskeletal: nontender, no swelling, no obvious deformity Extremities: no edema, no cyanosis, no clubbing Pulses: 1+ symmetric upper extremities, 1+ bilat dorsalis pedis pulses, but 2+ posterior tibialis pulses, right foot with somewhat decreased cap refill more pronounced than left foot toes. Neurological:  alert, oriented x 3, CN2-12 intact, strength normal upper extremities and lower extremities, sensation normal throughout, DTRs 2+ throughout, no cerebellar signs, gait normal Psychiatric: normal affect, behavior normal, pleasant  Declines other exam, rectal , GU    Assessment:   Encounter Diagnoses  Name Primary?  . Medicare annual wellness visit, subsequent Yes  . Routine general medical examination at a health care facility   . Noncompliance   . Tobacco chew use   . Vaccine counseling   . Hyperlipidemia, unspecified hyperlipidemia type   . History of kidney stones   . Essential hypertension   . Impaired fasting blood sugar   . Vaccine refused by patient   . Cramping of feet   . Corn of foot      Plan:   A preventative services visit was  completed today.  During the course of the visit today, we discussed and counseled about appropriate screening and preventive services.  A health risk assessment was established today that included a review of current medications, allergies, social history, family history, medical and preventative health history, biometrics, and preventative screenings to identify potential safety concerns or impairments.  A personalized plan was printed today for your records and use.   Personalized health advice and education was given today to reduce health risks and promote self management and wellness.  Information regarding end of life planning was discussed today.  Conditions/risks identified: Noncompliance  Chronic problems discussed today: Noncompliance.   As per prior years, we discussed his history of uncontrolled hypertension, complications, risk factors, treatment options.   Advised healthy diet, regular exercise, limited salt intake.  Advised medication to control BP but he refuses.   Reiterated potential complications with uncontrolled BP.    He does want to see labs, so labs today  Acute problems discussed today: Corn of foot - discussed home  remedies to try.  Recheck if not improving.  Toe cramping - labs today  Recommendations:  I recommend a yearly ophthalmology/optometry visit for glaucoma screening and eye checkup  I recommended a yearly dental visit for hygiene and checkup  Advanced directives - discussed nature and purpose of Advanced Directives, encouraged them to complete them if they have not done so and/or encouraged them to get [place] a copy if they have done this already.  Referrals today: none  Immunizations: Gave recommendations on Tdap, pneumococcal, shingles, flu shot, but he declines all!  Medicare Attestation A preventative services visit was completed today.  During the course of the visit the patient was educated and counseled about appropriate screening and preventive services.  A health risk assessment was established with the patient that included a review of current medications, allergies, social history, family history, medical and preventative health history, biometrics, and preventative screenings to identify potential safety concerns or impairments.  A personalized plan was printed today for the patient's records and use.   Personalized health advice and education was given today to reduce health risks and promote self management and wellness.  Information regarding end of life planning was discussed today.  Crisoforo Oxford, PA-C   04/01/2016   [place] was seen today for medicare visit.  Diagnoses and all orders for this visit:  Medicare annual wellness visit, subsequent  Routine general medical examination at a health care facility -     Comprehensive metabolic panel -     Lipid panel -     PSA -     Hemoglobin A1c -     Microalbumin / creatinine urine ratio -     Uric Acid  Noncompliance  Tobacco chew use  Vaccine counseling  Hyperlipidemia, unspecified hyperlipidemia type -     Comprehensive metabolic panel -     Lipid panel -     Hemoglobin A1c  History of kidney  stones  Essential hypertension -     Comprehensive metabolic panel -     Lipid panel -     Microalbumin / creatinine urine ratio -     Uric Acid  Impaired fasting blood sugar -     Hemoglobin A1c  Vaccine refused by patient  Cramping of feet -     Uric Acid  Corn of foot

## 2016-04-02 LAB — MICROALBUMIN / CREATININE URINE RATIO
CREATININE, URINE: 79 mg/dL (ref 20–370)
Microalb Creat Ratio: 3 mcg/mg creat (ref <30)
Microalb, Ur: 0.2 mg/dL

## 2016-04-26 DIAGNOSIS — M545 Low back pain: Secondary | ICD-10-CM | POA: Diagnosis not present

## 2016-04-26 DIAGNOSIS — M9904 Segmental and somatic dysfunction of sacral region: Secondary | ICD-10-CM | POA: Diagnosis not present

## 2016-04-26 DIAGNOSIS — M9903 Segmental and somatic dysfunction of lumbar region: Secondary | ICD-10-CM | POA: Diagnosis not present

## 2016-04-26 DIAGNOSIS — M5137 Other intervertebral disc degeneration, lumbosacral region: Secondary | ICD-10-CM | POA: Diagnosis not present

## 2016-04-26 DIAGNOSIS — M546 Pain in thoracic spine: Secondary | ICD-10-CM | POA: Diagnosis not present

## 2016-04-26 DIAGNOSIS — M9902 Segmental and somatic dysfunction of thoracic region: Secondary | ICD-10-CM | POA: Diagnosis not present

## 2016-05-24 DIAGNOSIS — M9903 Segmental and somatic dysfunction of lumbar region: Secondary | ICD-10-CM | POA: Diagnosis not present

## 2016-05-24 DIAGNOSIS — M545 Low back pain: Secondary | ICD-10-CM | POA: Diagnosis not present

## 2016-05-24 DIAGNOSIS — M546 Pain in thoracic spine: Secondary | ICD-10-CM | POA: Diagnosis not present

## 2016-05-24 DIAGNOSIS — M5137 Other intervertebral disc degeneration, lumbosacral region: Secondary | ICD-10-CM | POA: Diagnosis not present

## 2016-05-24 DIAGNOSIS — M9904 Segmental and somatic dysfunction of sacral region: Secondary | ICD-10-CM | POA: Diagnosis not present

## 2016-05-24 DIAGNOSIS — M9902 Segmental and somatic dysfunction of thoracic region: Secondary | ICD-10-CM | POA: Diagnosis not present

## 2016-06-04 ENCOUNTER — Telehealth: Payer: Self-pay | Admitting: Medical

## 2016-06-04 NOTE — Telephone Encounter (Signed)
That's fine, refer to derm

## 2016-06-04 NOTE — Telephone Encounter (Signed)
Pt came in and stated that at his last ov he failed to mention that he needed a referral to dermatology. He states at last years cpe he was given a referral but due to scheduling issues did not go. Pt was offered a appt to discuss with you and he declined. He stated he does not want to come in again. Please call pt at [phone].

## 2016-06-05 NOTE — Telephone Encounter (Signed)
Skin lesion, skin surveillance

## 2016-06-05 NOTE — Telephone Encounter (Signed)
Faxed referral to [facility] dermatology

## 2016-06-05 NOTE — Telephone Encounter (Signed)
What is the dx?

## 2016-06-21 DIAGNOSIS — M546 Pain in thoracic spine: Secondary | ICD-10-CM | POA: Diagnosis not present

## 2016-06-21 DIAGNOSIS — M9902 Segmental and somatic dysfunction of thoracic region: Secondary | ICD-10-CM | POA: Diagnosis not present

## 2016-06-21 DIAGNOSIS — M545 Low back pain: Secondary | ICD-10-CM | POA: Diagnosis not present

## 2016-06-21 DIAGNOSIS — M9904 Segmental and somatic dysfunction of sacral region: Secondary | ICD-10-CM | POA: Diagnosis not present

## 2016-06-21 DIAGNOSIS — M5137 Other intervertebral disc degeneration, lumbosacral region: Secondary | ICD-10-CM | POA: Diagnosis not present

## 2016-06-21 DIAGNOSIS — M9903 Segmental and somatic dysfunction of lumbar region: Secondary | ICD-10-CM | POA: Diagnosis not present

## 2016-07-05 DIAGNOSIS — D2239 Melanocytic nevi of other parts of face: Secondary | ICD-10-CM | POA: Diagnosis not present

## 2016-07-05 DIAGNOSIS — D1801 Hemangioma of skin and subcutaneous tissue: Secondary | ICD-10-CM | POA: Diagnosis not present

## 2016-07-05 DIAGNOSIS — L821 Other seborrheic keratosis: Secondary | ICD-10-CM | POA: Diagnosis not present

## 2016-07-05 DIAGNOSIS — D225 Melanocytic nevi of trunk: Secondary | ICD-10-CM | POA: Diagnosis not present

## 2016-07-05 DIAGNOSIS — L570 Actinic keratosis: Secondary | ICD-10-CM | POA: Diagnosis not present

## 2016-07-05 DIAGNOSIS — L814 Other melanin hyperpigmentation: Secondary | ICD-10-CM | POA: Diagnosis not present

## 2016-07-18 DIAGNOSIS — M9904 Segmental and somatic dysfunction of sacral region: Secondary | ICD-10-CM | POA: Diagnosis not present

## 2016-07-18 DIAGNOSIS — M9903 Segmental and somatic dysfunction of lumbar region: Secondary | ICD-10-CM | POA: Diagnosis not present

## 2016-07-18 DIAGNOSIS — M545 Low back pain: Secondary | ICD-10-CM | POA: Diagnosis not present

## 2016-07-18 DIAGNOSIS — M9902 Segmental and somatic dysfunction of thoracic region: Secondary | ICD-10-CM | POA: Diagnosis not present

## 2016-07-18 DIAGNOSIS — M546 Pain in thoracic spine: Secondary | ICD-10-CM | POA: Diagnosis not present

## 2016-07-18 DIAGNOSIS — M5137 Other intervertebral disc degeneration, lumbosacral region: Secondary | ICD-10-CM | POA: Diagnosis not present

## 2016-08-08 DIAGNOSIS — M9904 Segmental and somatic dysfunction of sacral region: Secondary | ICD-10-CM | POA: Diagnosis not present

## 2016-08-08 DIAGNOSIS — M546 Pain in thoracic spine: Secondary | ICD-10-CM | POA: Diagnosis not present

## 2016-08-08 DIAGNOSIS — M545 Low back pain: Secondary | ICD-10-CM | POA: Diagnosis not present

## 2016-08-08 DIAGNOSIS — M9903 Segmental and somatic dysfunction of lumbar region: Secondary | ICD-10-CM | POA: Diagnosis not present

## 2016-08-08 DIAGNOSIS — M9902 Segmental and somatic dysfunction of thoracic region: Secondary | ICD-10-CM | POA: Diagnosis not present

## 2016-08-08 DIAGNOSIS — M5137 Other intervertebral disc degeneration, lumbosacral region: Secondary | ICD-10-CM | POA: Diagnosis not present

## 2016-08-19 DIAGNOSIS — M9902 Segmental and somatic dysfunction of thoracic region: Secondary | ICD-10-CM | POA: Diagnosis not present

## 2016-08-19 DIAGNOSIS — M9903 Segmental and somatic dysfunction of lumbar region: Secondary | ICD-10-CM | POA: Diagnosis not present

## 2016-08-19 DIAGNOSIS — M9904 Segmental and somatic dysfunction of sacral region: Secondary | ICD-10-CM | POA: Diagnosis not present

## 2016-08-19 DIAGNOSIS — M5137 Other intervertebral disc degeneration, lumbosacral region: Secondary | ICD-10-CM | POA: Diagnosis not present

## 2016-08-19 DIAGNOSIS — M546 Pain in thoracic spine: Secondary | ICD-10-CM | POA: Diagnosis not present

## 2016-08-19 DIAGNOSIS — M545 Low back pain: Secondary | ICD-10-CM | POA: Diagnosis not present

## 2016-08-21 DIAGNOSIS — H5213 Myopia, bilateral: Secondary | ICD-10-CM | POA: Diagnosis not present

## 2016-08-21 DIAGNOSIS — H25013 Cortical age-related cataract, bilateral: Secondary | ICD-10-CM | POA: Diagnosis not present

## 2016-08-21 DIAGNOSIS — H11153 Pinguecula, bilateral: Secondary | ICD-10-CM | POA: Diagnosis not present

## 2016-08-21 DIAGNOSIS — H52223 Regular astigmatism, bilateral: Secondary | ICD-10-CM | POA: Diagnosis not present

## 2016-08-21 DIAGNOSIS — H2513 Age-related nuclear cataract, bilateral: Secondary | ICD-10-CM | POA: Diagnosis not present

## 2016-08-21 DIAGNOSIS — H353131 Nonexudative age-related macular degeneration, bilateral, early dry stage: Secondary | ICD-10-CM | POA: Diagnosis not present

## 2016-08-21 DIAGNOSIS — H18413 Arcus senilis, bilateral: Secondary | ICD-10-CM | POA: Diagnosis not present

## 2016-09-03 DIAGNOSIS — H353131 Nonexudative age-related macular degeneration, bilateral, early dry stage: Secondary | ICD-10-CM | POA: Diagnosis not present

## 2016-09-03 DIAGNOSIS — H25813 Combined forms of age-related cataract, bilateral: Secondary | ICD-10-CM | POA: Diagnosis not present

## 2016-09-19 DIAGNOSIS — M9902 Segmental and somatic dysfunction of thoracic region: Secondary | ICD-10-CM | POA: Diagnosis not present

## 2016-09-19 DIAGNOSIS — M9904 Segmental and somatic dysfunction of sacral region: Secondary | ICD-10-CM | POA: Diagnosis not present

## 2016-09-19 DIAGNOSIS — M5137 Other intervertebral disc degeneration, lumbosacral region: Secondary | ICD-10-CM | POA: Diagnosis not present

## 2016-09-19 DIAGNOSIS — M9903 Segmental and somatic dysfunction of lumbar region: Secondary | ICD-10-CM | POA: Diagnosis not present

## 2016-09-19 DIAGNOSIS — M546 Pain in thoracic spine: Secondary | ICD-10-CM | POA: Diagnosis not present

## 2016-09-19 DIAGNOSIS — M545 Low back pain: Secondary | ICD-10-CM | POA: Diagnosis not present

## 2016-09-26 DIAGNOSIS — H2512 Age-related nuclear cataract, left eye: Secondary | ICD-10-CM | POA: Diagnosis not present

## 2016-10-17 DIAGNOSIS — M5137 Other intervertebral disc degeneration, lumbosacral region: Secondary | ICD-10-CM | POA: Diagnosis not present

## 2016-10-17 DIAGNOSIS — M546 Pain in thoracic spine: Secondary | ICD-10-CM | POA: Diagnosis not present

## 2016-10-17 DIAGNOSIS — M9902 Segmental and somatic dysfunction of thoracic region: Secondary | ICD-10-CM | POA: Diagnosis not present

## 2016-10-17 DIAGNOSIS — M9904 Segmental and somatic dysfunction of sacral region: Secondary | ICD-10-CM | POA: Diagnosis not present

## 2016-10-17 DIAGNOSIS — M545 Low back pain: Secondary | ICD-10-CM | POA: Diagnosis not present

## 2016-10-17 DIAGNOSIS — M9903 Segmental and somatic dysfunction of lumbar region: Secondary | ICD-10-CM | POA: Diagnosis not present

## 2016-11-18 DIAGNOSIS — M5137 Other intervertebral disc degeneration, lumbosacral region: Secondary | ICD-10-CM | POA: Diagnosis not present

## 2016-11-18 DIAGNOSIS — M9904 Segmental and somatic dysfunction of sacral region: Secondary | ICD-10-CM | POA: Diagnosis not present

## 2016-11-18 DIAGNOSIS — M545 Low back pain: Secondary | ICD-10-CM | POA: Diagnosis not present

## 2016-11-18 DIAGNOSIS — M9902 Segmental and somatic dysfunction of thoracic region: Secondary | ICD-10-CM | POA: Diagnosis not present

## 2016-11-18 DIAGNOSIS — M9903 Segmental and somatic dysfunction of lumbar region: Secondary | ICD-10-CM | POA: Diagnosis not present

## 2016-11-18 DIAGNOSIS — M546 Pain in thoracic spine: Secondary | ICD-10-CM | POA: Diagnosis not present

## 2016-12-05 DIAGNOSIS — H2511 Age-related nuclear cataract, right eye: Secondary | ICD-10-CM | POA: Diagnosis not present

## 2016-12-05 DIAGNOSIS — H5231 Anisometropia: Secondary | ICD-10-CM | POA: Diagnosis not present

## 2016-12-06 DIAGNOSIS — H2511 Age-related nuclear cataract, right eye: Secondary | ICD-10-CM | POA: Diagnosis not present

## 2016-12-13 DIAGNOSIS — M545 Low back pain: Secondary | ICD-10-CM | POA: Diagnosis not present

## 2016-12-13 DIAGNOSIS — M546 Pain in thoracic spine: Secondary | ICD-10-CM | POA: Diagnosis not present

## 2016-12-13 DIAGNOSIS — M9902 Segmental and somatic dysfunction of thoracic region: Secondary | ICD-10-CM | POA: Diagnosis not present

## 2016-12-13 DIAGNOSIS — M9904 Segmental and somatic dysfunction of sacral region: Secondary | ICD-10-CM | POA: Diagnosis not present

## 2016-12-13 DIAGNOSIS — M5137 Other intervertebral disc degeneration, lumbosacral region: Secondary | ICD-10-CM | POA: Diagnosis not present

## 2016-12-13 DIAGNOSIS — M9903 Segmental and somatic dysfunction of lumbar region: Secondary | ICD-10-CM | POA: Diagnosis not present

## 2017-01-13 DIAGNOSIS — M546 Pain in thoracic spine: Secondary | ICD-10-CM | POA: Diagnosis not present

## 2017-01-13 DIAGNOSIS — M5137 Other intervertebral disc degeneration, lumbosacral region: Secondary | ICD-10-CM | POA: Diagnosis not present

## 2017-01-13 DIAGNOSIS — M545 Low back pain: Secondary | ICD-10-CM | POA: Diagnosis not present

## 2017-01-13 DIAGNOSIS — M9904 Segmental and somatic dysfunction of sacral region: Secondary | ICD-10-CM | POA: Diagnosis not present

## 2017-01-13 DIAGNOSIS — M9902 Segmental and somatic dysfunction of thoracic region: Secondary | ICD-10-CM | POA: Diagnosis not present

## 2017-01-13 DIAGNOSIS — M9903 Segmental and somatic dysfunction of lumbar region: Secondary | ICD-10-CM | POA: Diagnosis not present

## 2017-02-19 DIAGNOSIS — H539 Unspecified visual disturbance: Secondary | ICD-10-CM | POA: Diagnosis not present

## 2017-02-19 DIAGNOSIS — R531 Weakness: Secondary | ICD-10-CM | POA: Diagnosis not present

## 2017-02-24 DIAGNOSIS — G459 Transient cerebral ischemic attack, unspecified: Secondary | ICD-10-CM | POA: Diagnosis not present

## 2017-02-24 DIAGNOSIS — I10 Essential (primary) hypertension: Secondary | ICD-10-CM | POA: Diagnosis not present

## 2017-02-25 ENCOUNTER — Encounter (HOSPITAL_COMMUNITY): Payer: Self-pay

## 2017-02-25 ENCOUNTER — Emergency Department (HOSPITAL_COMMUNITY): Payer: Medicare HMO

## 2017-02-25 ENCOUNTER — Inpatient Hospital Stay (HOSPITAL_COMMUNITY)
Admission: EM | Admit: 2017-02-25 | Discharge: 2017-02-27 | DRG: 040 | Disposition: A | Discharge status: Rehabilitation Facility | Payer: Medicare HMO | Attending: Internal Medicine | Admitting: Internal Medicine

## 2017-02-25 ENCOUNTER — Other Ambulatory Visit: Payer: Self-pay

## 2017-02-25 DIAGNOSIS — E785 Hyperlipidemia, unspecified: Secondary | ICD-10-CM | POA: Diagnosis present

## 2017-02-25 DIAGNOSIS — R29702 NIHSS score 2: Secondary | ICD-10-CM | POA: Diagnosis present

## 2017-02-25 DIAGNOSIS — I69319 Unspecified symptoms and signs involving cognitive functions following cerebral infarction: Secondary | ICD-10-CM | POA: Diagnosis not present

## 2017-02-25 DIAGNOSIS — I110 Hypertensive heart disease with heart failure: Secondary | ICD-10-CM | POA: Diagnosis present

## 2017-02-25 DIAGNOSIS — F17220 Nicotine dependence, chewing tobacco, uncomplicated: Secondary | ICD-10-CM | POA: Diagnosis present

## 2017-02-25 DIAGNOSIS — I6522 Occlusion and stenosis of left carotid artery: Secondary | ICD-10-CM | POA: Diagnosis not present

## 2017-02-25 DIAGNOSIS — I69393 Ataxia following cerebral infarction: Secondary | ICD-10-CM | POA: Diagnosis not present

## 2017-02-25 DIAGNOSIS — R270 Ataxia, unspecified: Secondary | ICD-10-CM | POA: Diagnosis present

## 2017-02-25 DIAGNOSIS — Z9119 Patient's noncompliance with other medical treatment and regimen: Secondary | ICD-10-CM | POA: Diagnosis not present

## 2017-02-25 DIAGNOSIS — I69398 Other sequelae of cerebral infarction: Secondary | ICD-10-CM | POA: Diagnosis not present

## 2017-02-25 DIAGNOSIS — E782 Mixed hyperlipidemia: Secondary | ICD-10-CM | POA: Diagnosis not present

## 2017-02-25 DIAGNOSIS — Z8673 Personal history of transient ischemic attack (TIA), and cerebral infarction without residual deficits: Secondary | ICD-10-CM | POA: Diagnosis not present

## 2017-02-25 DIAGNOSIS — R2681 Unsteadiness on feet: Secondary | ICD-10-CM | POA: Diagnosis not present

## 2017-02-25 DIAGNOSIS — I420 Dilated cardiomyopathy: Secondary | ICD-10-CM | POA: Diagnosis present

## 2017-02-25 DIAGNOSIS — Z720 Tobacco use: Secondary | ICD-10-CM

## 2017-02-25 DIAGNOSIS — I6320 Cerebral infarction due to unspecified occlusion or stenosis of unspecified precerebral arteries: Secondary | ICD-10-CM | POA: Diagnosis not present

## 2017-02-25 DIAGNOSIS — Z87820 Personal history of traumatic brain injury: Secondary | ICD-10-CM | POA: Diagnosis not present

## 2017-02-25 DIAGNOSIS — I5042 Chronic combined systolic (congestive) and diastolic (congestive) heart failure: Secondary | ICD-10-CM

## 2017-02-25 DIAGNOSIS — I6340 Cerebral infarction due to embolism of unspecified cerebral artery: Secondary | ICD-10-CM | POA: Diagnosis not present

## 2017-02-25 DIAGNOSIS — Z7982 Long term (current) use of aspirin: Secondary | ICD-10-CM | POA: Diagnosis not present

## 2017-02-25 DIAGNOSIS — H53461 Homonymous bilateral field defects, right side: Secondary | ICD-10-CM | POA: Diagnosis present

## 2017-02-25 DIAGNOSIS — H532 Diplopia: Secondary | ICD-10-CM | POA: Diagnosis not present

## 2017-02-25 DIAGNOSIS — R7303 Prediabetes: Secondary | ICD-10-CM | POA: Diagnosis not present

## 2017-02-25 DIAGNOSIS — H539 Unspecified visual disturbance: Secondary | ICD-10-CM | POA: Diagnosis not present

## 2017-02-25 DIAGNOSIS — Z79899 Other long term (current) drug therapy: Secondary | ICD-10-CM | POA: Diagnosis not present

## 2017-02-25 DIAGNOSIS — I10 Essential (primary) hypertension: Secondary | ICD-10-CM | POA: Diagnosis not present

## 2017-02-25 DIAGNOSIS — I5043 Acute on chronic combined systolic (congestive) and diastolic (congestive) heart failure: Secondary | ICD-10-CM | POA: Diagnosis present

## 2017-02-25 DIAGNOSIS — I6389 Other cerebral infarction: Secondary | ICD-10-CM | POA: Diagnosis not present

## 2017-02-25 DIAGNOSIS — I639 Cerebral infarction, unspecified: Secondary | ICD-10-CM | POA: Diagnosis not present

## 2017-02-25 DIAGNOSIS — E118 Type 2 diabetes mellitus with unspecified complications: Secondary | ICD-10-CM | POA: Diagnosis not present

## 2017-02-25 DIAGNOSIS — I340 Nonrheumatic mitral (valve) insufficiency: Secondary | ICD-10-CM | POA: Diagnosis not present

## 2017-02-25 DIAGNOSIS — E119 Type 2 diabetes mellitus without complications: Secondary | ICD-10-CM | POA: Diagnosis not present

## 2017-02-25 DIAGNOSIS — R269 Unspecified abnormalities of gait and mobility: Secondary | ICD-10-CM | POA: Diagnosis not present

## 2017-02-25 LAB — COMPREHENSIVE METABOLIC PANEL
ALBUMIN: 4 g/dL (ref 3.5–5.0)
ALT: 16 U/L — AB (ref 17–63)
AST: 19 U/L (ref 15–41)
Alkaline Phosphatase: 72 U/L (ref 38–126)
Anion gap: 11 (ref 5–15)
BUN: 14 mg/dL (ref 6–20)
CHLORIDE: 101 mmol/L (ref 101–111)
CO2: 25 mmol/L (ref 22–32)
CREATININE: 0.91 mg/dL (ref 0.61–1.24)
Calcium: 9.4 mg/dL (ref 8.9–10.3)
GFR calc non Af Amer: >60 mL/min (ref >60)
GLUCOSE: 96 mg/dL (ref 65–99)
Potassium: 4 mmol/L (ref 3.5–5.1)
SODIUM: 137 mmol/L (ref 135–145)
Total Bilirubin: 0.6 mg/dL (ref 0.3–1.2)
Total Protein: 7.2 g/dL (ref 6.5–8.1)

## 2017-02-25 LAB — DIFFERENTIAL
BASOS ABS: 0 10*3/uL (ref 0.0–0.1)
BASOS PCT: 0 %
Eosinophils Absolute: 0.1 10*3/uL (ref 0.0–0.7)
Eosinophils Relative: 1 %
Lymphocytes Relative: 27 %
Lymphs Abs: 1.5 10*3/uL (ref 0.7–4.0)
MONO ABS: 0.4 10*3/uL (ref 0.1–1.0)
Monocytes Relative: 6 %
NEUTROS ABS: 3.7 10*3/uL (ref 1.7–7.7)
NEUTROS PCT: 66 %

## 2017-02-25 LAB — CBC
HCT: 44.8 % (ref 39.0–52.0)
Hemoglobin: 15.2 g/dL (ref 13.0–17.0)
MCH: 29 pg (ref 26.0–34.0)
MCHC: 33.9 g/dL (ref 30.0–36.0)
MCV: 85.3 fL (ref 78.0–100.0)
Platelets: 214 10*3/uL (ref 150–400)
RBC: 5.25 MIL/uL (ref 4.22–5.81)
RDW: 14 % (ref 11.5–15.5)
WBC: 5.6 10*3/uL (ref 4.0–10.5)

## 2017-02-25 LAB — PROTIME-INR
INR: 0.98
Prothrombin Time: 12.9 seconds (ref 11.4–15.2)

## 2017-02-25 LAB — I-STAT TROPONIN, ED: Troponin i, poc: 0.01 ng/mL (ref 0.00–0.08)

## 2017-02-25 LAB — APTT: aPTT: 32 s (ref 24–36)

## 2017-02-25 MED ORDER — ENOXAPARIN SODIUM 40 MG/0.4ML ~~LOC~~ SOLN
40.0000 mg | SUBCUTANEOUS | Status: DC
  Administered 2017-02-26: 40 mg via SUBCUTANEOUS
  Filled 2017-02-25: qty 0.4

## 2017-02-25 MED ORDER — ACETAMINOPHEN 650 MG RE SUPP: 650.0000 mg | RECTAL | Status: DC | PRN

## 2017-02-25 MED ORDER — ACETAMINOPHEN 325 MG PO TABS: 650.0000 mg | ORAL_TABLET | ORAL | Status: DC | PRN

## 2017-02-25 MED ORDER — LISINOPRIL 20 MG PO TABS
20.0000 mg | ORAL_TABLET | Freq: Every day | ORAL | Status: DC
  Administered 2017-02-26: 20 mg via ORAL
  Filled 2017-02-25: qty 1

## 2017-02-25 MED ORDER — ASPIRIN 300 MG RE SUPP: 300.0000 mg | Freq: Every day | RECTAL | Status: DC

## 2017-02-25 MED ORDER — STROKE: EARLY STAGES OF RECOVERY BOOK
Freq: Once | Status: AC
  Administered 2017-02-26
  Filled 2017-02-25: qty 1

## 2017-02-25 MED ORDER — SENNOSIDES-DOCUSATE SODIUM 8.6-50 MG PO TABS: 1.0000 | ORAL_TABLET | Freq: Every evening | ORAL | Status: DC | PRN

## 2017-02-25 MED ORDER — ONDANSETRON HCL 4 MG/2ML IJ SOLN
4.0000 mg | Freq: Four times a day (QID) | INTRAMUSCULAR | Status: DC | PRN
Start: 2017-02-25 — End: 2017-02-27

## 2017-02-25 MED ORDER — ASPIRIN 325 MG PO TABS
325.0000 mg | ORAL_TABLET | Freq: Every day | ORAL | Status: DC
  Administered 2017-02-26: 325 mg via ORAL
  Filled 2017-02-25: qty 1

## 2017-02-25 MED ORDER — ACETAMINOPHEN 160 MG/5ML PO SOLN: 650.0000 mg | ORAL | Status: DC | PRN

## 2017-02-25 MED ORDER — AMLODIPINE BESYLATE 5 MG PO TABS
5.0000 mg | ORAL_TABLET | Freq: Every day | ORAL | Status: DC
  Administered 2017-02-26: 5 mg via ORAL
  Filled 2017-02-25: qty 1

## 2017-02-25 NOTE — ED Provider Notes (Signed)
[facility] EMERGENCY DEPARTMENT Provider Note   CSN: [id number] Arrival date & time: 02/25/17  1549     History   Chief Complaint Chief Complaint  Patient presents with  . stroke evaluation    HPI  Justin [name] is a 67 y.o. male with past medical history of stroke, hypertension, hyperlipidemia, and head trauma with hematoma and craniotomy for evacuation, presenting to the ED from his ophthalmologist office for stroke workup.  Patient reports intermittent episodes of blurry/double vision with some unsteady gait since the week before Christmas.  Patient was seen by his PCP and neurology yesterday.  Neurology, Greig Right, NP with [name], ordered outpatient MRI with carotid studies. He was also seen by his PCP yesterday per neurology recommendation for management of HTN; he was started on 5mg  norvasc and 20mg  lisinopril. Patient reported to his ophthalmologist today for evaluation, following bilateral cataract surgery that occurred in November.  He states he was evaluated by his ophthalmologist, Dr. Katy Fitch, who recommended he report to the ED today for stroke workup due to new right inferior quadrant visual defect.  Pt states he had 2 more episodes of blurry/double vision and unsteady gait sometime this morning before noon.  He states each episode lasted about 5-8 minutes in duration.  His significant other is present, who witnessed the events and states he did not have any facial drooping, slurred speech, or confusion.  Patient states he feels normal currently, and is asymptomatic. Of note, pt with hx of HTN, not taking medications. Reports no residual deficits since stroke that occurred in 2010. However, states he had a head trauma in 1997, where he developed a hematoma which required a craniotomy for evacuation. He reports residual defects since that injury, including loss of smell, taste, depth perception, and issues regulating his body temperature.  The history is  provided by the patient.    Past Medical History:  Diagnosis Date  . Calcium oxalate renal stones   . Chewing tobacco use   . Gallstones    hx/o, s/p choleycystectomy  . H/O echocardiogram 10/2008   no wall thickness, EF 55%, mild mitral regurge  . Hyperlipidemia   . Hypertension    hx/o, noncompliance prior with medication  . Influenza vaccine refused 09/2013  . Rheumatic fever    age 15  . Stroke ([place]) 10/02/2008  . Tinnitus     Patient Active Problem List   Diagnosis Date Noted  . CVA (cerebral vascular accident) ([place]) 02/25/2017  . Medicare annual wellness visit, subsequent 04/01/2016  . Impaired fasting blood sugar 04/01/2016  . Vaccine refused by patient 04/01/2016  . Cramping of feet 04/01/2016  . History of kidney stones 02/24/2015  . Noncompliance 02/24/2015  . Hyperlipidemia 02/24/2015  . Essential hypertension 02/24/2015  . Routine general medical examination at a health care facility 02/24/2015  . Corn of foot 02/24/2015  . Paresthesia of left foot 02/24/2015  . Advanced directives, counseling/discussion 02/24/2015  . Vaccine counseling 02/24/2015  . Tobacco chew use 05/06/2011    Past Surgical History:  Procedure Laterality Date  . blood clot  1997   brain  . BRAIN SURGERY  1997   craniotomy, evacuation of hematoma  . CHOLECYSTECTOMY  09/06/2011   Procedure: LAPAROSCOPIC CHOLECYSTECTOMY WITH INTRAOPERATIVE CHOLANGIOGRAM;  Surgeon: Earnstine Regal, MD;  Location: [facility]S;  Service: General;  Laterality: N/A;  . COLONOSCOPY  01/04/14   normal. Dr. Owens Loffler  . Dental implant    . JOINT REPLACEMENT  2004  LEFT BICEP REPAIR, RTC repair  . ROTATOR CUFF REPAIR     Right  . SIGMOIDOSCOPY         Home Medications    Prior to Admission medications   Medication Sig Start Date End Date Taking? Authorizing Provider  amLODipine (NORVASC) 5 MG tablet Take 5 mg by mouth daily. 02/24/17  Yes [provider]  aspirin 81 MG chewable tablet Chew 81 mg by  mouth daily.   Yes [provider]  lisinopril (PRINIVIL,ZESTRIL) 20 MG tablet Take 20 mg by mouth daily. 02/24/17  Yes [provider]  Multiple Vitamins-Minerals (PRESERVISION AREDS 2) CAPS Take 2 capsules by mouth daily.   Yes [provider]    Family History Family History  Problem Relation Age of Onset  . Heart disease Mother 70       MI  . Migraines Mother   . Cirrhosis Brother   . Alcohol abuse Father        lived to age 24  . Diabetes Brother   . Colon cancer Neg Hx   . Cancer Neg Hx   . Stroke Neg Hx   . Hypertension Neg Hx   . Hyperlipidemia Neg Hx     Social History Social History   Tobacco Use  . Smoking status: Never Smoker  . Smokeless tobacco: Current User    Types: Chew  Substance Use Topics  . Alcohol use: No    Alcohol/week: 0.0 oz  . Drug use: No     Allergies   Patient has no known allergies.   Review of Systems Review of Systems  Eyes: Positive for visual disturbance.  Respiratory: Negative for shortness of breath.   Cardiovascular: Negative for chest pain.  Neurological: Negative for syncope, facial asymmetry, speech difficulty, weakness, numbness and headaches.       Unsteady gait  Hematological: Does not bruise/bleed easily.  Psychiatric/Behavioral: Negative for confusion.  All other systems reviewed and are negative.    Physical Exam Updated Vital Signs BP 132/75   Pulse (!) 57   Temp 98 F (36.7 C) (Oral)   Resp 10   Ht 5\' 6"  (1.676 m)   Wt 68 kg (150 lb)   SpO2 98%   BMI 24.21 kg/m   Physical Exam  Constitutional: He is oriented to person, place, and time. He appears well-developed and well-nourished. No distress.  HENT:  Head: Normocephalic and atraumatic.  Eyes: Conjunctivae are normal.  Neck: Normal range of motion. Neck supple.  Cardiovascular: Normal rate, regular rhythm, normal heart sounds and intact distal pulses.  No murmur heard. Pulmonary/Chest: Effort normal and breath sounds  normal. No respiratory distress.  Abdominal: Soft. Bowel sounds are normal.  Musculoskeletal: Normal range of motion.  No LE edema  Neurological: He is alert and oriented to person, place, and time.  Mental Status:  Alert, oriented, thought content appropriate, able to give a coherent history. Speech fluent without evidence of aphasia. Able to follow 2 step commands without difficulty.  Cranial Nerves:  II:  Peripheral visual fields grossly normal, pupils are dilated (medically dilated at ophthalmologist prior to arrival) equal, round III,IV, VI: ptosis not present, extra-ocular motions intact bilaterally  V,VII: smile symmetric, facial light touch sensation equal VIII: hearing grossly normal to voice  X: uvula elevates symmetrically  XI: bilateral shoulder shrug symmetric and strong XII: midline tongue extension without fassiculations Motor:  Normal tone. 5/5 in upper and lower extremities bilaterally including strong and equal grip strength and dorsiflexion/plantar flexion Sensory:  Pinprick and light touch normal in all extremities.  Deep Tendon Reflexes: 2+ and symmetric in the biceps and patella Cerebellar: normal finger-to-nose with bilateral upper extremities Gait: normal gait and balance CV: distal pulses palpable throughout    Skin: Skin is warm.  Psychiatric: He has a normal mood and affect. His behavior is normal.  Nursing note and vitals reviewed.    ED Treatments / Results  Labs (all labs ordered are listed, but only abnormal results are displayed) Labs Reviewed  COMPREHENSIVE METABOLIC PANEL - Abnormal; Notable for the following components:      Result Value   ALT 16 (*)    All other components within normal limits  PROTIME-INR  APTT  CBC  DIFFERENTIAL  HEMOGLOBIN A1C  LIPID PANEL  CBC  BASIC METABOLIC PANEL  I-STAT TROPONIN, ED    EKG  EKG Interpretation None       Radiology Ct Head Wo Contrast  Result Date: 02/25/2017 CLINICAL DATA:  Blurred  vision.  Unsteady gait. EXAM: CT HEAD WITHOUT CONTRAST TECHNIQUE: Contiguous axial images were obtained from the base of the skull through the vertex without intravenous contrast. COMPARISON:  None. FINDINGS: Brain: No acute intracranial abnormality. Specifically, no hemorrhage, hydrocephalus, mass lesion, acute infarction, or significant intracranial injury. Vascular: No hyperdense vessel or unexpected calcification. Skull: Prior craniotomy in the left posterior parietal region. No acute calvarial abnormality. Sinuses/Orbits: Visualized paranasal sinuses and mastoids clear. Orbital soft tissues unremarkable. Other: None IMPRESSION: No acute intracranial abnormality. Electronically Signed   By: Rolm Baptise M.D.   On: 02/25/2017 17:20   [name]  Result Date: 02/25/2017 CLINICAL DATA:  Initial evaluation for intermittent blurry vision, double vision, unsteady gait. Evaluate for stroke. EXAM: MRI HEAD WITHOUT CONTRAST TECHNIQUE: Multiplanar, multiecho pulse sequences of the brain and surrounding structures were obtained without intravenous contrast. COMPARISON:  Prior CT from earlier the same day. FINDINGS: Brain: Age-appropriate cerebral atrophy. Minimal scattered T2/FLAIR hyperintensity within the periventricular and deep white matter both cerebral hemispheres, most like related chronic small vessel ischemic disease, felt to be within normal limits for age. Small remote lacunar infarct present within the left thalamus. 7 mm acute ischemic lacunar type infarct present within the inferior left basal ganglia (series 3, image 24). Mild scattered patchy subcentimeter cortical infarcts present within the left occipital lobe, left PCA distribution (series 3, image 31, 26). Few additional subcentimeter ischemic infarcts noted within the right cerebellar hemisphere (series 3, image 19, 15). No associated hemorrhage or mass effect. No other evidence for acute or subacute ischemia. Gray-white  matter differentiation otherwise maintained. No mass lesion, midline shift or mass effect. No hydrocephalus. No extra-axial fluid collection. Major dural sinuses grossly patent. Pituitary gland suprasellar region normal. Midline structures intact and normal. Vascular: Major intracranial vascular flow voids are maintained. Skull and upper cervical spine: Craniocervical junction normal. Upper cervical spine normal. Bone marrow signal intensity within normal limits. Postoperative changes from prior left parietal craniotomy noted. No acute scalp soft tissue abnormality. Sinuses/Orbits: Globes orbital soft tissues within normal limits. Patient status post lens extraction bilaterally. Paranasal sinuses are clear. Trace opacity noted within the bilateral mastoid air cells, of doubtful significance. Inner ear structures normal. Other: None IMPRESSION: 1. Patchy small volume acute ischemic nonhemorrhagic subcentimeter infarcts involving the left basal ganglia, left parieto-occipital region, and right cerebellar hemisphere as above. 2. Small remote left thalamic lacunar infarct. 3. Sequelae of prior left parietal craniotomy. Electronically Signed   By: Pincus Badder.D.  On: 02/25/2017 20:59    Procedures Procedures (including critical care time)  Medications Ordered in ED Medications  amLODipine (NORVASC) tablet 5 mg (not administered)  lisinopril (PRINIVIL,ZESTRIL) tablet 20 mg (not administered)   stroke: mapping our early stages of recovery book (not administered)  acetaminophen (TYLENOL) tablet 650 mg (not administered)    Or  acetaminophen (TYLENOL) solution 650 mg (not administered)    Or  acetaminophen (TYLENOL) suppository 650 mg (not administered)  senna-docusate (Senokot-S) tablet 1 tablet (not administered)  enoxaparin (LOVENOX) injection 40 mg (not administered)  ondansetron (ZOFRAN) injection 4 mg (not administered)  aspirin suppository 300 mg (not administered)    Or  aspirin  tablet 325 mg (not administered)     Initial Impression / Assessment and Plan / ED Course  I have reviewed the triage vital signs and the nursing notes.  Pertinent labs & imaging results that were available during my care of the patient were reviewed by me and considered in my medical decision making (see chart for details).   Pt presenting w intermittent episodes of blurry/double vision and imbalance since December, with recent episodes this morning. Pt seen by Neurology yesterday, and ophthalmology today. His ophthalmologist, Dr. Katy Fitch, recommended pt report to ED today due to new visual field defect on exam; right inferior quadrant field defect. On exam in ED, pt without neuro deficits, asymptomatic in ED. Given recent episodes this morning and reported new visual field defects by his ophthalmologist, MRI brain ordered. MRI showing acute patchy infarcts in left basal ganglia, left parieto-occipital region, right cerebellar hemisphere, and small remote left thalamic lacunar infarct. Discussed results with patient, and recommendation for admission.  Clinical Course as of Feb 25 2313  Tue Feb 25, 2017  2131 Pt discussed with Dr. Cheral Marker with Neuro. He will evaluate patient, and recommending medical admission for stroke workup  [JR]  2205 Dr. Tamala Julian with [facility] accepting admission.  [JR]    Clinical Course User Index [JR] Robinson, [place] N, PA-C    Patient discussed with and seen by Dr. Dorie Rank.  The patient appears reasonably stabilized for admission considering the current resources, flow, and capabilities available in the ED at this time, and I doubt any other [facility] requiring further screening and/or treatment in the ED prior to admission.  Final Clinical Impressions(s) / ED Diagnoses   Final diagnoses:  Acute ischemic stroke [facility])    ED Discharge Orders    None       Robinson, [place] N, PA-C 02/25/17 2315    Dorie Rank, MD 02/26/17 [phone]

## 2017-02-25 NOTE — ED Notes (Signed)
IP floor not available to take report at this time

## 2017-02-25 NOTE — H&P (Signed)
History and Physical    Justin [name] ZOX:[id number] DOB: 27-Feb-1950 DOA: 02/25/2017  Referring MD/NP/PA: Martinique Robinson, PA-C PCP: Carlena Hurl, PA-C  Patient coming from: Sent from ophthalmology office  Chief Complaint: Blurry vision  I have personally briefly reviewed patient's old medical records in [place]   HPI: Justin [name] is a 67 y.o. 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  Patient reports having cataract surgery in August and November 2018.  He reports having blurry vision and/or double vision that lasts a few seconds and then self resolved. Reports having episodes since onset.  He initially thought symptoms were related to his recent cataract surgery.  Associated symptoms included intermittent episodes of feeling off balance with possible weakness in his lower extremities.  He was seen by neurology at [facility] yesterday and recommended to have outpatient MRI and carotid Dopplers.  He has had at least 2-3 episodes of visual changes today.  He went to his ophthalmologist Dr. Katy Fitch today, and noted to have a new right lower quadrant visual field deficit for which he was sent to the emergency department for further evaluation.  He had a  history of hypertension, but had not been on any medications.  He was just recently placed back on lisinopril and amlodipine by his PCP as they were thinking symptoms may have been related to the above.   ED Course: Upon admission into the emergency department patient was noted to be afebrile, blood pressure 137/76-162/120, and all other vital signs stable.  Labs including CBC and CMP within normal limits.  CT scan of the brain showed no acute abnormalities.  Subsequently, MRI of the brain revealed  neurology was consulted, but patient not a TPA candidate.  Review of Systems  Constitutional: Negative for chills, diaphoresis and fever.  HENT: Negative for ear discharge and ear pain.   Eyes: Positive for double vision.       Positive for visual disturbance  Respiratory: Negative for cough and hemoptysis.   Cardiovascular: Negative for chest pain and palpitations.  Gastrointestinal: Negative for abdominal pain, diarrhea, nausea and vomiting.  Genitourinary: Negative for dysuria, frequency and urgency.  Musculoskeletal: Negative for back pain and neck pain.  Skin: Negative for itching and rash.  Neurological: Negative for sensory change and speech change.       Positive for difficulty with balance  Psychiatric/Behavioral: The patient does not have insomnia.     Past Medical History:  Diagnosis Date  . Calcium oxalate renal stones   . Chewing tobacco use   . Gallstones    hx/o, s/p choleycystectomy  . H/O echocardiogram 10/2008   no wall thickness, EF 55%, mild mitral regurge  . Hyperlipidemia   . Hypertension    hx/o, noncompliance prior with medication  . Influenza vaccine refused 09/2013  . Rheumatic fever    age 57  . Stroke ([place]) 10/02/2008  . Tinnitus     Past Surgical History:  Procedure Laterality Date  . blood clot  1997   brain  . BRAIN SURGERY  1997   craniotomy, evacuation of hematoma  . CHOLECYSTECTOMY  09/06/2011   Procedure: LAPAROSCOPIC CHOLECYSTECTOMY WITH INTRAOPERATIVE CHOLANGIOGRAM;  Surgeon: Earnstine Regal, MD;  Location: [facility];  Service: General;  Laterality: N/A;  . COLONOSCOPY  01/04/14  normal. Dr. Owens Loffler  . Dental implant    . JOINT REPLACEMENT  2004   LEFT BICEP REPAIR, RTC repair  . ROTATOR CUFF REPAIR     Right  . SIGMOIDOSCOPY       reports that  has never smoked. His smokeless tobacco use includes chew. He reports that he does not drink alcohol or use drugs.  No Known Allergies  Family History  Problem Relation Age of Onset    . Heart disease Mother 54       MI  . Migraines Mother   . Cirrhosis Brother   . Alcohol abuse Father        lived to age 32  . Diabetes Brother   . Colon cancer Neg Hx   . Cancer Neg Hx   . Stroke Neg Hx   . Hypertension Neg Hx   . Hyperlipidemia Neg Hx     Prior to Admission medications   Medication Sig Start Date End Date Taking? Authorizing Provider  amLODipine (NORVASC) 5 MG tablet Take 5 mg by mouth daily. 02/24/17  Yes [provider]  aspirin 81 MG chewable tablet Chew 81 mg by mouth daily.   Yes [provider]  lisinopril (PRINIVIL,ZESTRIL) 20 MG tablet Take 20 mg by mouth daily. 02/24/17  Yes [provider]  Multiple Vitamins-Minerals (PRESERVISION AREDS 2) CAPS Take 2 capsules by mouth daily.   Yes [provider]    Physical Exam:  Constitutional: NAD, calm, comfortable Vitals:   02/25/17 1554 02/25/17 1800 02/25/17 1802  BP: (!) 162/120 137/76   Pulse: 73 66   Resp: 18 18   Temp: 98 F (36.7 C)    TempSrc: Oral    SpO2: 100% 99%   Weight:   68 kg (150 lb)  Height:   5\' 6"  (1.676 m)   Eyes: PERRL, lids and conjunctivae normal ENMT: Mucous membranes are moist. Posterior pharynx clear of any exudate or lesions.Normal dentition.  Neck: normal, supple, no masses, no thyromegaly Respiratory: clear to auscultation bilaterally, no wheezing, no crackles. Normal respiratory effort. No accessory muscle use.  Cardiovascular: Regular rate and rhythm, no murmurs / rubs / gallops. No extremity edema. 2+ pedal pulses. No carotid bruits.  Abdomen: no tenderness, no masses palpated. No hepatosplenomegaly. Bowel sounds positive.  Musculoskeletal: no clubbing / cyanosis. No joint deformity upper and lower extremities. Good ROM, no contractures. Normal muscle tone.  Skin: no rashes, lesions, ulcers. No induration Neurologic: CN 2-12 grossly intact. Sensation intact, DTR normal. Strength 5/5 in all 4.   Psychiatric: Normal judgment and  insight. Alert and oriented x 3. Normal mood.     Labs on Admission: I have personally reviewed following labs and imaging studies  CBC: Recent Labs  Lab 02/25/17 1607  WBC 5.6  NEUTROABS 3.7  HGB 15.2  HCT 44.8  MCV 85.3  PLT 973   Basic Metabolic Panel: Recent Labs  Lab 02/25/17 1607  NA 137  K 4.0  CL 101  CO2 25  GLUCOSE 96  BUN 14  CREATININE 0.91  CALCIUM 9.4   GFR: Estimated Creatinine Clearance: 71.1 mL/min (by C-G formula based on SCr of 0.91 mg/dL). Liver Function Tests: Recent Labs  Lab 02/25/17 1607  AST 19  ALT 16*  ALKPHOS 72  BILITOT 0.6  PROT 7.2  ALBUMIN 4.0   No results for input(s): LIPASE, AMYLASE in the last 168 hours. No results for input(s): AMMONIA in the last 168 hours. Coagulation Profile: Recent Labs  Lab 02/25/17 1607  INR 0.98   Cardiac Enzymes: No results for input(s): CKTOTAL, CKMB, CKMBINDEX, TROPONINI in the last 168 hours. BNP (last 3 results) No results for input(s): PROBNP in the last 8760 hours. HbA1C: No results for input(s): HGBA1C in the last 72 hours. CBG: No results for input(s): GLUCAP in the last 168 hours. Lipid Profile: No results for input(s): CHOL, HDL, LDLCALC, TRIG, CHOLHDL, LDLDIRECT in the last 72 hours. Thyroid Function Tests: No results for input(s): TSH, T4TOTAL, FREET4, T3FREE, THYROIDAB in the last 72 hours. Anemia Panel: No results for input(s): VITAMINB12, FOLATE, FERRITIN, TIBC, IRON, RETICCTPCT in the last 72 hours. Urine analysis:    Component Value Date/Time   COLORURINE YELLOW 09/01/2014 1021   APPEARANCEUR TURBID (A) 09/01/2014 1021   LABSPEC 1.020 09/01/2014 1021   PHURINE 8.0 09/01/2014 1021   GLUCOSEU NEGATIVE 09/01/2014 1021   HGBUR LARGE (A) 09/01/2014 1021   BILIRUBINUR n 02/24/2015 0956   KETONESUR 15 (A) 09/01/2014 1021   PROTEINUR n 02/24/2015 0956   PROTEINUR NEGATIVE 09/01/2014 1021   UROBILINOGEN negative 02/24/2015 0956   UROBILINOGEN 1.0 09/01/2014 1021    NITRITE n 02/24/2015 0956   NITRITE NEGATIVE 09/01/2014 1021   LEUKOCYTESUR Negative 02/24/2015 0956   Sepsis Labs: No results found for this or any previous visit (from the past 240 hour(s)).   Radiological Exams on Admission: Ct Head Wo Contrast  Result Date: 02/25/2017 CLINICAL DATA:  Blurred vision.  Unsteady gait. EXAM: CT HEAD WITHOUT CONTRAST TECHNIQUE: Contiguous axial images were obtained from the base of the skull through the vertex without intravenous contrast. COMPARISON:  None. FINDINGS: Brain: No acute intracranial abnormality. Specifically, no hemorrhage, hydrocephalus, mass lesion, acute infarction, or significant intracranial injury. Vascular: No hyperdense vessel or unexpected calcification. Skull: Prior craniotomy in the left posterior parietal region. No acute calvarial abnormality. Sinuses/Orbits: Visualized paranasal sinuses and mastoids clear. Orbital soft tissues unremarkable. Other: None IMPRESSION: No acute intracranial abnormality. Electronically Signed   By: Rolm Baptise M.D.   On: 02/25/2017 17:20   [name]  Result Date: 02/25/2017 CLINICAL DATA:  Initial evaluation for intermittent blurry vision, double vision, unsteady gait. Evaluate for stroke. EXAM: MRI HEAD WITHOUT CONTRAST TECHNIQUE: Multiplanar, multiecho pulse sequences of the brain and surrounding structures were obtained without intravenous contrast. COMPARISON:  Prior CT from earlier the same day. FINDINGS: Brain: Age-appropriate cerebral atrophy. Minimal scattered T2/FLAIR hyperintensity within the periventricular and deep white matter both cerebral hemispheres, most like related chronic small vessel ischemic disease, felt to be within normal limits for age. Small remote lacunar infarct present within the left thalamus. 7 mm acute ischemic lacunar type infarct present within the inferior left basal ganglia (series 3, image 24). Mild scattered patchy subcentimeter cortical infarcts  present within the left occipital lobe, left PCA distribution (series 3, image 31, 26). Few additional subcentimeter ischemic infarcts noted within the right cerebellar hemisphere (series 3, image 19, 15). No associated hemorrhage or mass effect. No other evidence for acute or subacute ischemia. Gray-white matter differentiation otherwise maintained. No mass lesion, midline shift or mass effect. No hydrocephalus. No extra-axial fluid collection. Major dural sinuses grossly patent. Pituitary gland suprasellar region normal. Midline structures intact and normal. Vascular: Major intracranial vascular flow voids are maintained. Skull and upper cervical spine: Craniocervical junction normal. Upper cervical spine normal. Bone marrow signal intensity within normal limits. Postoperative changes from prior left parietal craniotomy noted. No acute scalp soft tissue abnormality. Sinuses/Orbits: Globes orbital soft tissues within  normal limits. Patient status post lens extraction bilaterally. Paranasal sinuses are clear. Trace opacity noted within the bilateral mastoid air cells, of doubtful significance. Inner ear structures normal. Other: None IMPRESSION: 1. Patchy small volume acute ischemic nonhemorrhagic subcentimeter infarcts involving the left basal ganglia, left parieto-occipital region, and right cerebellar hemisphere as above. 2. Small remote left thalamic lacunar infarct. 3. Sequelae of prior left parietal craniotomy. Electronically Signed   By: Jeannine Boga M.D.   On: 02/25/2017 20:59    EKG: Independently reviewed.  Normal sinus rhythm with LVH  Assessment/Plan Visual disturbance  2/2 CVA: Acute/subacute.  Notes with complaints of intermittent visual disturbance and issues with balance.  Risk factors include HTN, tobacco use - Admit to telemetry bed - Stroke order set initiated - Neuro checks - Check  vas carotid U/S - PT/OT/Speech to eval and treat - Check echocardiogram - Check Hemoglobin A1c  and lipid panel in a.m. - ASA - Appreciate neurology consultative services, will follow-up  - Add on blood cultures and CK level per recommendations   Essential hypertension - Continue amlodipine and lisinopril  Diabetes mellitus type 2, controlled: Patient appears to be diet controlled as he is not on any oral medications and initial blood sugar within normal limits.  Last hemoglobin A1c noted to be 6 on 04/01/2016 and prior to that 6.5 back in 01/2015. - Follow-up hemoglobin A1c - Continue to monitor  Hyperlipidemia - Start on atorvastatin as long as CK level within normal limits  Chew tobacco use - Counsel on the need of cessation of tobacco use  DVT prophylaxis: lovenox Code Status: Full  Family Communication: This plan of care with the patient and family present at bedside Disposition Plan: TBD Consults called: neurology  Admission status: Oobservation  Norval Morton MD [facility] Pager [phone]   If 7PM-7AM, please contact night-coverage [email or web address] Password [facility]  02/25/2017, 9:57 PM

## 2017-02-25 NOTE — ED Notes (Signed)
Pt ambulated to restroom. No balance or gait issues, but some weakness noted when Pt was attempting to stand up.

## 2017-02-25 NOTE — ED Notes (Signed)
Pt states he is not claustrophobic 

## 2017-02-25 NOTE — ED Triage Notes (Signed)
Patient sent from ophthalmology office for stroke work-up. Reports that he developed intermittent blurred vision with double vision and unsteady gait since December, has had previous stroke. Patient on arrival alert and oriented, no neuro deficits. Speech clear.

## 2017-02-25 NOTE — ED Provider Notes (Signed)
Medical screening examination/treatment/procedure(s) were conducted as a shared visit with non-physician practitioner(s) and myself.  I personally evaluated the patient during the encounter.  Pt presents with concerns for possible stroke.  Pt has been having intermittent issues with his vision and gait.  Pt has seen neurology and has further tests ordered but his sx became acutely worse and his ophthalmologist sent him to the ED.  MRI shows an acute stroke.  Outside TPA window.   VAn negative.  Will admit for further workup.     Dorie Rank, MD 02/25/17 2149

## 2017-02-26 ENCOUNTER — Encounter (HOSPITAL_COMMUNITY): Payer: Self-pay | Admitting: Radiology

## 2017-02-26 ENCOUNTER — Observation Stay (HOSPITAL_COMMUNITY): Payer: Medicare HMO

## 2017-02-26 ENCOUNTER — Observation Stay (HOSPITAL_BASED_OUTPATIENT_CLINIC_OR_DEPARTMENT_OTHER): Payer: Medicare HMO

## 2017-02-26 DIAGNOSIS — I420 Dilated cardiomyopathy: Secondary | ICD-10-CM

## 2017-02-26 DIAGNOSIS — I639 Cerebral infarction, unspecified: Secondary | ICD-10-CM

## 2017-02-26 DIAGNOSIS — E118 Type 2 diabetes mellitus with unspecified complications: Secondary | ICD-10-CM | POA: Diagnosis not present

## 2017-02-26 DIAGNOSIS — E785 Hyperlipidemia, unspecified: Secondary | ICD-10-CM | POA: Diagnosis present

## 2017-02-26 DIAGNOSIS — I69393 Ataxia following cerebral infarction: Secondary | ICD-10-CM | POA: Diagnosis not present

## 2017-02-26 DIAGNOSIS — E119 Type 2 diabetes mellitus without complications: Secondary | ICD-10-CM

## 2017-02-26 DIAGNOSIS — I69398 Other sequelae of cerebral infarction: Secondary | ICD-10-CM | POA: Diagnosis not present

## 2017-02-26 DIAGNOSIS — I10 Essential (primary) hypertension: Secondary | ICD-10-CM | POA: Diagnosis not present

## 2017-02-26 DIAGNOSIS — R7303 Prediabetes: Secondary | ICD-10-CM | POA: Diagnosis not present

## 2017-02-26 DIAGNOSIS — Z8673 Personal history of transient ischemic attack (TIA), and cerebral infarction without residual deficits: Secondary | ICD-10-CM

## 2017-02-26 DIAGNOSIS — I69319 Unspecified symptoms and signs involving cognitive functions following cerebral infarction: Secondary | ICD-10-CM | POA: Diagnosis not present

## 2017-02-26 DIAGNOSIS — R269 Unspecified abnormalities of gait and mobility: Secondary | ICD-10-CM | POA: Diagnosis not present

## 2017-02-26 DIAGNOSIS — Z87820 Personal history of traumatic brain injury: Secondary | ICD-10-CM

## 2017-02-26 DIAGNOSIS — R29702 NIHSS score 2: Secondary | ICD-10-CM | POA: Diagnosis present

## 2017-02-26 DIAGNOSIS — R270 Ataxia, unspecified: Secondary | ICD-10-CM | POA: Diagnosis present

## 2017-02-26 DIAGNOSIS — I340 Nonrheumatic mitral (valve) insufficiency: Secondary | ICD-10-CM

## 2017-02-26 DIAGNOSIS — I6340 Cerebral infarction due to embolism of unspecified cerebral artery: Secondary | ICD-10-CM | POA: Diagnosis present

## 2017-02-26 DIAGNOSIS — E782 Mixed hyperlipidemia: Secondary | ICD-10-CM | POA: Diagnosis not present

## 2017-02-26 DIAGNOSIS — H53461 Homonymous bilateral field defects, right side: Secondary | ICD-10-CM | POA: Diagnosis present

## 2017-02-26 DIAGNOSIS — I5043 Acute on chronic combined systolic (congestive) and diastolic (congestive) heart failure: Secondary | ICD-10-CM

## 2017-02-26 DIAGNOSIS — I5042 Chronic combined systolic (congestive) and diastolic (congestive) heart failure: Secondary | ICD-10-CM

## 2017-02-26 DIAGNOSIS — F17220 Nicotine dependence, chewing tobacco, uncomplicated: Secondary | ICD-10-CM | POA: Diagnosis present

## 2017-02-26 DIAGNOSIS — I6389 Other cerebral infarction: Secondary | ICD-10-CM | POA: Diagnosis not present

## 2017-02-26 DIAGNOSIS — I6320 Cerebral infarction due to unspecified occlusion or stenosis of unspecified precerebral arteries: Secondary | ICD-10-CM | POA: Diagnosis not present

## 2017-02-26 DIAGNOSIS — I6522 Occlusion and stenosis of left carotid artery: Secondary | ICD-10-CM | POA: Diagnosis not present

## 2017-02-26 DIAGNOSIS — Z79899 Other long term (current) drug therapy: Secondary | ICD-10-CM | POA: Diagnosis not present

## 2017-02-26 DIAGNOSIS — I110 Hypertensive heart disease with heart failure: Secondary | ICD-10-CM | POA: Diagnosis present

## 2017-02-26 DIAGNOSIS — Z720 Tobacco use: Secondary | ICD-10-CM | POA: Diagnosis not present

## 2017-02-26 LAB — LIPID PANEL
CHOL/HDL RATIO: 5.1 ratio
CHOLESTEROL: 211 mg/dL — AB (ref 0–200)
HDL: 41 mg/dL (ref >40)
LDL Cholesterol: 139 mg/dL — ABNORMAL HIGH (ref 0–99)
TRIGLYCERIDES: 153 mg/dL — AB (ref <150)
VLDL: 31 mg/dL (ref 0–40)

## 2017-02-26 LAB — HEMOGLOBIN A1C
Hgb A1c MFr Bld: 6.5 % — ABNORMAL HIGH (ref 4.8–5.6)
Mean Plasma Glucose: 139.85 mg/dL

## 2017-02-26 LAB — CBC
HEMATOCRIT: 41.3 % (ref 39.0–52.0)
HEMOGLOBIN: 14.3 g/dL (ref 13.0–17.0)
MCH: 29.3 pg (ref 26.0–34.0)
MCHC: 34.6 g/dL (ref 30.0–36.0)
MCV: 84.6 fL (ref 78.0–100.0)
Platelets: 183 10*3/uL (ref 150–400)
RBC: 4.88 MIL/uL (ref 4.22–5.81)
RDW: 13.7 % (ref 11.5–15.5)
WBC: 5.2 10*3/uL (ref 4.0–10.5)

## 2017-02-26 LAB — GLUCOSE, CAPILLARY
GLUCOSE-CAPILLARY: 104 mg/dL — AB (ref 65–99)
Glucose-Capillary: 146 mg/dL — ABNORMAL HIGH (ref 65–99)

## 2017-02-26 LAB — BASIC METABOLIC PANEL
Anion gap: 8 (ref 5–15)
BUN: 12 mg/dL (ref 6–20)
CHLORIDE: 104 mmol/L (ref 101–111)
CO2: 24 mmol/L (ref 22–32)
Calcium: 8.7 mg/dL — ABNORMAL LOW (ref 8.9–10.3)
Creatinine, Ser: 0.97 mg/dL (ref 0.61–1.24)
GFR calc Af Amer: >60 mL/min (ref >60)
GFR calc non Af Amer: >60 mL/min (ref >60)
Glucose, Bld: 157 mg/dL — ABNORMAL HIGH (ref 65–99)
POTASSIUM: 3.6 mmol/L (ref 3.5–5.1)
SODIUM: 136 mmol/L (ref 135–145)

## 2017-02-26 LAB — CK: CK TOTAL: 56 U/L (ref 49–397)

## 2017-02-26 LAB — ECHOCARDIOGRAM COMPLETE
HEIGHTINCHES: 66 in
Weight: 2433.88 oz

## 2017-02-26 MED ORDER — ATORVASTATIN CALCIUM 40 MG PO TABS
40.0000 mg | ORAL_TABLET | Freq: Every day | ORAL | Status: DC
  Administered 2017-02-26 – 2017-02-27 (×2): 40 mg via ORAL
  Filled 2017-02-26 (×2): qty 1

## 2017-02-26 MED ORDER — CARVEDILOL 3.125 MG PO TABS
3.1250 mg | ORAL_TABLET | Freq: Two times a day (BID) | ORAL | Status: DC
  Administered 2017-02-26 – 2017-02-27 (×2): 3.125 mg via ORAL
  Filled 2017-02-26 (×3): qty 1

## 2017-02-26 MED ORDER — INSULIN ASPART 100 UNIT/ML ~~LOC~~ SOLN: 0.0000 [IU] | Freq: Three times a day (TID) | SUBCUTANEOUS | Status: DC

## 2017-02-26 MED ORDER — ASPIRIN EC 325 MG PO TBEC
325.0000 mg | DELAYED_RELEASE_TABLET | Freq: Every day | ORAL | Status: DC
  Administered 2017-02-27: 325 mg via ORAL
  Filled 2017-02-26: qty 1

## 2017-02-26 MED ORDER — ASPIRIN EC 81 MG PO TBEC
81.0000 mg | DELAYED_RELEASE_TABLET | Freq: Every day | ORAL | Status: DC
  Administered 2017-02-26: 81 mg via ORAL
  Filled 2017-02-26: qty 1

## 2017-02-26 NOTE — Evaluation (Signed)
SLP Cancellation Note  Patient Details Name: Justin [name] MRN: [id number] DOB: 1950/10/23   Cancelled treatment:       Reason Eval/Treat Not Completed: Other (comment)(per OT - pt tearful during session, will continue efforts for eval later)   Macario Golds 02/26/2017, 8:31 AM  Luanna Salk, [place] [facility] SLP [phone]

## 2017-02-26 NOTE — Progress Notes (Signed)
Inpatient Rehabilitation  Per OT/PT request, patient was screened by Gunnar Fusi for appropriateness for an Inpatient Acute Rehab consult.  At this time we are recommending an Inpatient Rehab consult.  Text paged MD to notify; please order if you are agreeable.    Carmelia Roller., CCC/SLP Admission Coordinator  [place]  Cell [phone]

## 2017-02-26 NOTE — Consult Note (Signed)
Cardiology Consultation:   Patient ID: Justin [name] [name]; [id number]; 09-Mar-1950   Admit date: 02/25/2017 Date of Consult: 02/26/2017  Primary Care Provider: Carlena Hurl, PA-C Primary Cardiologist: No primary care provider on file. New- Dr. Sallyanne [name]  Patient Profile:   Justin [name] [name] is a 67 y.o. male with a hx of HTN, HLD, DM type 2-diet controlled, traumatic ICH s/p craniotomy in 1997, and CVA without residual deficit in 2010 who is being seen today for the evaluation of CHF at the request of Justin [name] [name].  History of Present Illness:   Justin [name] [name] Presented with complaints of intermittent visual disturbances that started the week before Christmas as well as intermittent feeling of being off balance with possible weakness in the lower extremities. The patient had had recent cataract surgery. He went to his ophthalmologist who noted a new right lower quadrant visual field deficit and pt was sent to the ED for further evaluation. MRI reveals patchy small volume acute ischemic nonhemorrhagic subcentimeter infarcts involving the left basal ganglia, left parieto-occipital region, and right cerebellar hemisphere. Neurology suspects likely cardioembolic source. He continues to have visual disturbance and balance issue.   He is planned for TEE and loop recorder. He has been started on a statin and aspirin increased to 325 mg daily.   An echocardiogram done today showed moderately reduced LV function with EF 35-40%, mildly dilated LV,  akinesis of the entire inferolateral, inferior, and inferoseptal myocardium, mild MR, and grade 2 DD. This is different than his previous echo in 2010 that showed Normal LV cavity size and wall thickness, EF 55%, mild MR.   CT of the neck showed Atherosclerosis most heavily involving the posterior circulation. 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 These stenoses lead to under opacification of left PCA vessels when compared to the right  (which is fetal type). Approximately 50% left supraclinoid ICA stenosis. Atherosclerosis in the neck without flow limiting stenosis or ulceration.  Upon my assessment of the patient he reports that he has never had a heart attack that he knows of. He had a stress test (per pt report) and echo after his stroke in 2010 in [place] which were normal. Justin [name] [name] is usually very active, sorks in the yard and uses a chain saw. He denies any exertional chest discomfort or dyspnea. He also denies orthopnea, PND, edema, palpitations, fast heart beat.   His wife is present and reports that he has had high blood pressure for 20 years and has been told that he needs to take medication, but he has refused. He was just started on amlodipine and lisinopril 2 days ago. He was once on aspirin 81 mg but had stopped taking that until 2 days ago. The patient appears fit and has no signs of volume overload.    Past Medical History:  Diagnosis Date  . Calcium oxalate renal stones   . Chewing tobacco use   . Gallstones    hx/o, s/p choleycystectomy  . H/O echocardiogram 10/2008   no wall thickness, EF 55%, mild mitral regurge  . Hyperlipidemia   . Hypertension    hx/o, noncompliance prior with medication  . Influenza vaccine refused 09/2013  . Rheumatic fever    age 37  . Stroke ([place]) 10/02/2008  . Tinnitus     Past Surgical History:  Procedure Laterality Date  . blood clot  1997   brain  . BRAIN SURGERY  1997   craniotomy, evacuation of hematoma  .  CHOLECYSTECTOMY  09/06/2011   Procedure: LAPAROSCOPIC CHOLECYSTECTOMY WITH INTRAOPERATIVE CHOLANGIOGRAM;  Surgeon: Justin [name] Regal, MD;  Location: [facility];  Service: General;  Laterality: N/A;  . COLONOSCOPY  01/04/14   normal. Dr. Owens [name]  . Dental implant    . JOINT REPLACEMENT  2004   LEFT BICEP REPAIR, RTC repair  . ROTATOR CUFF REPAIR     Right  . SIGMOIDOSCOPY       Home Medications:  Prior to Admission medications   Medication Sig Start Date  End Date Taking? Authorizing Provider  amLODipine (NORVASC) 5 MG tablet Take 5 mg by mouth daily. 02/24/17  Yes [provider]  aspirin 81 MG chewable tablet Chew 81 mg by mouth daily.   Yes [provider]  lisinopril (PRINIVIL,ZESTRIL) 20 MG tablet Take 20 mg by mouth daily. 02/24/17  Yes [provider]  Multiple Vitamins-Minerals (PRESERVISION AREDS 2) CAPS Take 2 capsules by mouth daily.   Yes [provider]    Inpatient Medications: Scheduled Meds: . amLODipine  5 mg Oral Daily  . aspirin EC  81 mg Oral Daily  . atorvastatin  40 mg Oral q1800  . enoxaparin (LOVENOX) injection  40 mg Subcutaneous Q24H  . insulin aspart  0-9 Units Subcutaneous TID WC  . lisinopril  20 mg Oral Daily   Continuous Infusions:  PRN Meds: acetaminophen **OR** acetaminophen (TYLENOL) oral liquid 160 mg/5 mL **OR** acetaminophen, ondansetron (ZOFRAN) IV, senna-docusate  Allergies:   No Known Allergies  Social History:   Social History   Socioeconomic History  . Marital status: Single    Spouse name: Not on file  . Number of children: 2  . Years of education: Not on file  . Highest education level: Not on file  Social Needs  . Financial resource strain: Not on file  . Food insecurity - worry: Not on file  . Food insecurity - inability: Not on file  . Transportation needs - medical: Not on file  . Transportation needs - non-medical: Not on file  Occupational History    Employer: [employer]  Tobacco Use  . Smoking status: Never Smoker  . Smokeless tobacco: Current User    Types: Chew  Substance and Sexual Activity  . Alcohol use: No    Alcohol/week: 0.0 oz  . Drug use: No  . Sexual activity: Not on file  Other Topics Concern  . Not on file  Social History Narrative   Single, has 2 Justin [name], 68yo and 68yo, electrician, walks regularly, 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      Family History:    Family History  Problem Relation Age of Onset    . Heart disease Mother 87       MI  . Migraines Mother   . Cirrhosis Brother   . Alcohol abuse Father        lived to age 9  . Diabetes Brother   . Colon cancer Neg Hx   . Cancer Neg Hx   . Stroke Neg Hx   . Hypertension Neg Hx   . Hyperlipidemia Neg Hx      ROS:  Please see the history of present illness.   All other ROS reviewed and negative.     Physical Exam/Data:   Vitals:   02/26/17 0525 02/26/17 1020 02/26/17 1035 02/26/17 1406  BP: (!) 123/55 (!) 141/101 (!) 146/78 (!) 134/59  Pulse: (!) 56  66 60  Resp: 18  16 15   Temp: 97.8 F (  36.6 C)  97.8 F (36.6 C) (!) 97.5 F (36.4 C)  TempSrc: Oral  Oral Oral  SpO2: 99%  100% 96%  Weight:      Height:       No intake or output data in the 24 hours ending 02/26/17 1613 Filed Weights   02/25/17 1802 02/26/17 0022  Weight: 150 lb (68 kg) 152 lb 1.9 oz (69 kg)   Body mass index is 24.55 kg/m.  General:  Well nourished, well developed, in no acute distress HEENT: normal Lymph: no adenopathy Neck: no JVD Endocrine:  No thryomegaly Vascular: No carotid bruits; FA pulses 2+ bilaterally without bruits  Cardiac:  normal S1, S2; RRR; no murmur  Lungs:  clear to auscultation bilaterally, no wheezing, rhonchi or rales  Abd: soft, nontender, no hepatomegaly  Ext: no edema Musculoskeletal:  No deformities, BUE and BLE strength normal and equal Skin: warm and dry  Neuro:   no focal abnormalities except continues to have visual disturbance and balance disturbance Psych:  Normal affect   EKG:  The EKG was personally reviewed and demonstrates:  Normal sinus rhythm, 72 bpm, Left ventricular hypertrophy with repolarization abnormality Telemetry:  Telemetry was personally reviewed and demonstrates:  NSR in the 60's with occ PVCs  Relevant CV Studies:  Echocardiogram 02/26/2017 Study Conclusions  - Left ventricle: The cavity size was mildly dilated. Systolic   function was moderately reduced. The estimated ejection  fraction   was in the range of 35% to 40%. There is akinesis of the   entireinferolateral, inferior, and inferoseptal myocardium.   Features are consistent with a pseudonormal left ventricular   filling pattern, with concomitant abnormal relaxation and   increased filling pressure (grade 2 diastolic dysfunction).   Doppler parameters are consistent with high ventricular filling   pressure. - Aortic valve: Trileaflet; mildly thickened, mildly calcified   leaflets. - Mitral valve: There was mild regurgitation. - Left atrium: The atrium was mildly dilated.  Echo 10/28/2008 Study Conclusions  1. Left ventricle: The cavity size was normal. Wall thickness was  normal. The estimated ejection fraction was 55%. 2. Mitral valve: Mild regurgitation.  Laboratory Data:  Chemistry Recent Labs  Lab 02/25/17 1607 02/26/17 0328  NA 137 136  K 4.0 3.6  CL 101 104  CO2 25 24  GLUCOSE 96 157*  BUN 14 12  CREATININE 0.91 0.97  CALCIUM 9.4 8.7*  GFRNONAA >60 >60  GFRAA >60 >60  ANIONGAP 11 8    Recent Labs  Lab 02/25/17 1607  PROT 7.2  ALBUMIN 4.0  AST 19  ALT 16*  ALKPHOS 72  BILITOT 0.6   Hematology Recent Labs  Lab 02/25/17 1607 02/26/17 0328  WBC 5.6 5.2  RBC 5.25 4.88  HGB 15.2 14.3  HCT 44.8 41.3  MCV 85.3 84.6  MCH 29.0 29.3  MCHC 33.9 34.6  RDW 14.0 13.7  PLT 214 183   Cardiac EnzymesNo results for input(s): TROPONINI in the last 168 hours.  Recent Labs  Lab 02/25/17 1628  TROPIPOC 0.01    BNPNo results for input(s): BNP, PROBNP in the last 168 hours.  DDimer No results for input(s): DDIMER in the last 168 hours.  Radiology/Studies:  Ct Angio Head W Or Wo Contrast  Result Date: 02/26/2017 CLINICAL DATA:  History of stroke. EXAM: CT ANGIOGRAPHY HEAD AND NECK TECHNIQUE: Multidetector CT imaging of the head and neck was performed using the standard protocol during bolus administration of intravenous contrast. Multiplanar CT image reconstructions and  MIPs  were obtained to evaluate the vascular anatomy. Carotid stenosis measurements (when applicable) are obtained utilizing NASCET criteria, using the distal internal carotid diameter as the denominator. CONTRAST:  50 cc Isovue 370 intravenous COMPARISON:  Brain MRI from yesterday FINDINGS: CTA NECK FINDINGS Aortic arch: Atheromatous wall thickening. Two vessel branching pattern. Right carotid system: Atheromatous wall thickening with small calcified plaque at the common carotid bifurcation. No flow limiting stenosis or ulceration. Negative for dissection or beading. Left carotid system: Mild atheromatous wall thickening. No stenosis or ulceration. Negative for beading. Vertebral arteries: Proximal subclavian atherosclerosis without flow limiting stenosis. Right dominant vertebral artery. Both vertebral arteries are smooth and widely patent to the dura Skeleton: Remote left parieto-occipital craniotomy, reportedly for hematoma evacuation. Other neck: No significant incidental finding. Upper chest: Negative Review of the MIP images confirms the above findings CTA HEAD FINDINGS Anterior circulation: Calcified and noncalcified plaque along the carotid siphons. Approximately 50% atheromatous narrowing at the left supraclinoid segment. Hypoplastic right A1 segment with robust anterior communicating artery. Fetal type right PCA. No branch occlusion or flow limiting stenosis. Posterior circulation: Atherosclerotic plaque on both V4 segments. Stenosis or occlusion of the left V4 segment beyond the patent pica, which is the not Ake at its origin. There is also a high-grade narrowing of the right V4 segment with small outpouching likely reflecting atheromatous plaque irregularity. Basilar artery is small in the setting of fetal type right PCA, with mild atheromatous irregularity and narrowing. Mild to moderate narrowing at the basilar left PCA junction. Best seen on axial MIPS, the left PCA is less dense than the right.  Atheromatous irregularity of left more than right PCA branches. Venous sinuses: Patent Anatomic variants: As above Delayed phase: No abnormal intracranial enhancement. Review of the MIP images confirms the above findings IMPRESSION: 1. Atherosclerosis most heavily involving the posterior circulation. 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 These stenoses lead to under opacification of left PCA vessels when compared to the right (which is fetal type). 2. Approximately 50% left supraclinoid ICA stenosis. 3. Atherosclerosis in the neck without flow limiting stenosis or ulceration. Electronically Signed   By: Monte Fantasia M.D.   On: 02/26/2017 13:55   Ct Head Wo Contrast  Result Date: 02/25/2017 CLINICAL DATA:  Blurred vision.  Unsteady gait. EXAM: CT HEAD WITHOUT CONTRAST TECHNIQUE: Contiguous axial images were obtained from the base of the skull through the vertex without intravenous contrast. COMPARISON:  None. FINDINGS: Brain: No acute intracranial abnormality. Specifically, no hemorrhage, hydrocephalus, mass lesion, acute infarction, or significant intracranial injury. Vascular: No hyperdense vessel or unexpected calcification. Skull: Prior craniotomy in the left posterior parietal region. No acute calvarial abnormality. Sinuses/Orbits: Visualized paranasal sinuses and mastoids clear. Orbital soft tissues unremarkable. Other: None IMPRESSION: No acute intracranial abnormality. Electronically Signed   By: Rolm Baptise M.D.   On: 02/25/2017 17:20   Ct Angio Neck W Or Wo Contrast  Result Date: 02/26/2017 CLINICAL DATA:  History of stroke. EXAM: CT ANGIOGRAPHY HEAD AND NECK TECHNIQUE: Multidetector CT imaging of the head and neck was performed using the standard protocol during bolus administration of intravenous contrast. Multiplanar CT image reconstructions and MIPs were obtained to evaluate the vascular anatomy. Carotid stenosis measurements (when applicable) are  obtained utilizing NASCET criteria, using the distal internal carotid diameter as the denominator. CONTRAST:  50 cc Isovue 370 intravenous COMPARISON:  Brain MRI from yesterday FINDINGS: CTA NECK FINDINGS Aortic arch: Atheromatous wall thickening. Two vessel branching pattern. Right carotid  system: Atheromatous wall thickening with small calcified plaque at the common carotid bifurcation. No flow limiting stenosis or ulceration. Negative for dissection or beading. Left carotid system: Mild atheromatous wall thickening. No stenosis or ulceration. Negative for beading. Vertebral arteries: Proximal subclavian atherosclerosis without flow limiting stenosis. Right dominant vertebral artery. Both vertebral arteries are smooth and widely patent to the dura Skeleton: Remote left parieto-occipital craniotomy, reportedly for hematoma evacuation. Other neck: No significant incidental finding. Upper chest: Negative Review of the MIP images confirms the above findings CTA HEAD FINDINGS Anterior circulation: Calcified and noncalcified plaque along the carotid siphons. Approximately 50% atheromatous narrowing at the left supraclinoid segment. Hypoplastic right A1 segment with robust anterior communicating artery. Fetal type right PCA. No branch occlusion or flow limiting stenosis. Posterior circulation: Atherosclerotic plaque on both V4 segments. Stenosis or occlusion of the left V4 segment beyond the patent pica, which is the not Ake at its origin. There is also a high-grade narrowing of the right V4 segment with small outpouching likely reflecting atheromatous plaque irregularity. Basilar artery is small in the setting of fetal type right PCA, with mild atheromatous irregularity and narrowing. Mild to moderate narrowing at the basilar left PCA junction. Best seen on axial MIPS, the left PCA is less dense than the right. Atheromatous irregularity of left more than right PCA branches. Venous sinuses: Patent Anatomic variants: As  above Delayed phase: No abnormal intracranial enhancement. Review of the MIP images confirms the above findings IMPRESSION: 1. Atherosclerosis most heavily involving the posterior circulation. 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 These stenoses lead to under opacification of left PCA vessels when compared to the right (which is fetal type). 2. Approximately 50% left supraclinoid ICA stenosis. 3. Atherosclerosis in the neck without flow limiting stenosis or ulceration. Electronically Signed   By: Monte Fantasia M.D.   On: 02/26/2017 13:55   [name]  Result Date: 02/25/2017 CLINICAL DATA:  Initial evaluation for intermittent blurry vision, double vision, unsteady gait. Evaluate for stroke. EXAM: MRI HEAD WITHOUT CONTRAST TECHNIQUE: Multiplanar, multiecho pulse sequences of the brain and surrounding structures were obtained without intravenous contrast. COMPARISON:  Prior CT from earlier the same day. FINDINGS: Brain: Age-appropriate cerebral atrophy. Minimal scattered T2/FLAIR hyperintensity within the periventricular and deep white matter both cerebral hemispheres, most like related chronic small vessel ischemic disease, felt to be within normal limits for age. Small remote lacunar infarct present within the left thalamus. 7 mm acute ischemic lacunar type infarct present within the inferior left basal ganglia (series 3, image 24). Mild scattered patchy subcentimeter cortical infarcts present within the left occipital lobe, left PCA distribution (series 3, image 31, 26). Few additional subcentimeter ischemic infarcts noted within the right cerebellar hemisphere (series 3, image 19, 15). No associated hemorrhage or mass effect. No other evidence for acute or subacute ischemia. Gray-white matter differentiation otherwise maintained. No mass lesion, midline shift or mass effect. No hydrocephalus. No extra-axial fluid collection. Major dural sinuses  grossly patent. Pituitary gland suprasellar region normal. Midline structures intact and normal. Vascular: Major intracranial vascular flow voids are maintained. Skull and upper cervical spine: Craniocervical junction normal. Upper cervical spine normal. Bone marrow signal intensity within normal limits. Postoperative changes from prior left parietal craniotomy noted. No acute scalp soft tissue abnormality. Sinuses/Orbits: Globes orbital soft tissues within normal limits. Patient status post lens extraction bilaterally. Paranasal sinuses are clear. Trace opacity noted within the bilateral mastoid air cells, of doubtful  significance. Inner ear structures normal. Other: None IMPRESSION: 1. Patchy small volume acute ischemic nonhemorrhagic subcentimeter infarcts involving the left basal ganglia, left parieto-occipital region, and right cerebellar hemisphere as above. 2. Small remote left thalamic lacunar infarct. 3. Sequelae of prior left parietal craniotomy. Electronically Signed   By: Jeannine Boga M.D.   On: 02/25/2017 20:59    Assessment and Plan:   Cardiomyopathy -Echocardiogram today shows moderately reduced LV function with EF 35-40%, mildly dilated LV, akinesis of the entire inferolateral, inferior, and inferoseptal myocardium, mild MR, and grade 2 DD. Echo in 2010 after his first stroke his echo was normal. -No previous known CAD. Has had untreated hypertension for about 20 years per pt.  -No evidence of fluid overload -new wall motion abnormality and reduced EF, no ischemic type symptoms, but should have ischemic eval with cardiac cath.  -Acute stroke thought to be likely cardioembolic etiology. Wonder if pt has been having tachyarrhythmias, possible afib that contributed to CM. Also akinesis in areas of the heart can lead to thrombosis.  -Pt says he is to go to inpatient rehab for 2 weeks. Plan for cardiac cath on the last day of rehab prior to going home.   Acute embolic stroke -MRI  reveals patchy small volume acute ischemic nonhemorrhagic subcentimeter infarcts involving the left basal ganglia, left parieto-occipital region, and right cerebellar hemisphere. Neurology suspects likely cardioembolic source.  -Aspirin 325 mg daily and statin added.  -Plan for TEE and loop recorder implantation tomorrow -Still has visual disturbance and balance disturbance. Plan to go to inpatient rehab.   Hypertension -Untreated for about 20 years per pt  -There was allowance for permissive hypertension with stroke, currently  BP well controlled on current meds.   Diabetes type 2 -Diet controlled. Hgb A1c 6.5, adequately controlled.   For questions or updates, please contact [facility] Please consult [email or web address] for contact info under Cardiology/STEMI.   Signed, Daune Perch, NP  02/26/2017 4:13 PM   I have seen and examined the patient along with Daune Perch, NP.  I have reviewed the chart, notes and new data.  I agree with PA/NP's note.  Key new complaints: unsteady gait, ataxia. No angina now or in the past, excellent functional status before the stroke. Key examination changes: normal CV exam. Key new findings / data: mild hypercholesterolemia (LDL 139), but well above target LDL 70, started on statin. ECG -NSR and LVH with diffuse ST changes, but no Q waves. Echo suggests ischemic cardiomyopathy with moderately decreased LVEF and RCA distribution regional wall motion abnormalities.  PLAN: TEE and loop recorder implantation tomorrow. Both procedures have been fully reviewed with the patient and written informed consent has been obtained. ASA, statin. Use carvedilol and ACEi/ARB for BP control, preferred over amlodipine. Needs coronary angiography, but would like to delay due to recent CVA. Ideally will perform coronary angio when he is ready for DC from inpatient rehab.  Sanda Klein, MD, [place] [phone] 02/26/2017, 5:31 PM

## 2017-02-26 NOTE — Progress Notes (Signed)
PROGRESS NOTE    Justin [name]  VEL:[id number] DOB: 1950/05/29 DOA: 02/25/2017 PCP: Carlena Hurl, PA-C     Brief Narrative:  Justin [name] is a 67 y.o. male with medical history significant of HTN, HLD, DM type II diet controlled, traumatic ICH s/p craniotomy in 1997, and CVA without residual deficit in 2010; who presents with complaints of intermittent visual disturbances starting the week before Christmas of 2018. Patient reports having cataract surgery in August and November 2018.  He reports having blurry vision and/or double vision that lasts a few seconds and then self resolved. Associated symptoms included intermittent episodes of feeling off balance with possible weakness in his lower extremities.  He was seen by neurology at [facility] yesterday and recommended to have outpatient MRI and carotid Dopplers.  He has had at least 2-3 episodes of visual changes.  He went to his ophthalmologist Dr. Katy Fitch, and noted to have a new right lower quadrant visual field deficit for which he was sent to the emergency department for further evaluation.   Assessment & Plan:   Principal Problem:   CVA (cerebral vascular accident) ([place]) Active Problems:   Tobacco chew use   Essential hypertension   Diabetes type 2, controlled ([place])   Acute ischemic CVA -MRI reveals patchy acute ischemic subcentimeter infarcts involving the left basal ganglia, left parieto-occipital region and right cerebellar hemisphere. A small remote left thalamic lacunar infarct and sequelae of prior left parietal craniotomy were also noted. Based on the multiple vascular territories involved, cardioembolic phenomenon is suspected.  -CTA head and neck showed atherosclerosis most heavily involving the posterior circulation. 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  -Stroke team following -TEE and loop recorder 1/31  -Aspirin, lipitor  -Follow up with [facility] in  6 weeks  New dx systolic CHF -Without volume overload, SOB, CP -EF 35% on echocardiogram -Cardiology consult   Essential hypertension - Continue amlodipine and lisinopril  Diabetes mellitus type 2, controlled -Ha1c 6.5  -SSI   Hyperlipidemia -Lipitor started   Chew tobacco use -Counsel on the need of cessation of tobacco use    DVT prophylaxis: lovenox Code Status: full Family Communication: at bedside Disposition Plan: pending work up    Consultants:   Neurology  Cardiology   Antimicrobials:  Anti-infectives (From admission, onward)   None        Subjective: No new issues, continues to have double vision  Objective: Vitals:   02/26/17 0525 02/26/17 1020 02/26/17 1035 02/26/17 1406  BP: (!) 123/55 (!) 141/101 (!) 146/78 (!) 134/59  Pulse: (!) 56  66 60  Resp: 18  16 15   Temp: 97.8 F (36.6 C)  97.8 F (36.6 C) (!) 97.5 F (36.4 C)  TempSrc: Oral  Oral Oral  SpO2: 99%  100% 96%  Weight:      Height:       No intake or output data in the 24 hours ending 02/26/17 1500 Filed Weights   02/25/17 1802 02/26/17 0022  Weight: 68 kg (150 lb) 69 kg (152 lb 1.9 oz)    Examination:  General exam: Appears calm and comfortable  Respiratory system: Clear to auscultation. Respiratory effort normal. Cardiovascular system: S1 & S2 heard, RRR. No JVD, murmurs, rubs, gallops or clicks. No pedal edema. Gastrointestinal system: Abdomen is nondistended, soft and nontender. No organomegaly or masses felt. Normal bowel sounds heard. Central nervous system: Alert and oriented Extremities: Symmetric 5 x 5 power.  Skin: No rashes, lesions or ulcers Psychiatry: Judgement and insight appear normal. Mood & affect appropriate.   Data Reviewed: I have personally reviewed following labs and imaging studies  CBC: Recent Labs  Lab 02/25/17 1607 02/26/17 0328  WBC 5.6 5.2  NEUTROABS 3.7  --   HGB 15.2 14.3  HCT 44.8 41.3  MCV 85.3 84.6  PLT 214 211   Basic  Metabolic Panel: Recent Labs  Lab 02/25/17 1607 02/26/17 0328  NA 137 136  K 4.0 3.6  CL 101 104  CO2 25 24  GLUCOSE 96 157*  BUN 14 12  CREATININE 0.91 0.97  CALCIUM 9.4 8.7*   GFR: Estimated Creatinine Clearance: 66.7 mL/min (by C-G formula based on SCr of 0.97 mg/dL). Liver Function Tests: Recent Labs  Lab 02/25/17 1607  AST 19  ALT 16*  ALKPHOS 72  BILITOT 0.6  PROT 7.2  ALBUMIN 4.0   No results for input(s): LIPASE, AMYLASE in the last 168 hours. No results for input(s): AMMONIA in the last 168 hours. Coagulation Profile: Recent Labs  Lab 02/25/17 1607  INR 0.98   Cardiac Enzymes: Recent Labs  Lab 02/26/17 0328  CKTOTAL 56   BNP (last 3 results) No results for input(s): PROBNP in the last 8760 hours. HbA1C: Recent Labs    02/26/17 0328  HGBA1C 6.5*   CBG: No results for input(s): GLUCAP in the last 168 hours. Lipid Profile: Recent Labs    02/26/17 0328  CHOL 211*  HDL 41  LDLCALC 139*  TRIG 153*  CHOLHDL 5.1   Thyroid Function Tests: No results for input(s): TSH, T4TOTAL, FREET4, T3FREE, THYROIDAB in the last 72 hours. Anemia Panel: No results for input(s): VITAMINB12, FOLATE, FERRITIN, TIBC, IRON, RETICCTPCT in the last 72 hours. Sepsis Labs: No results for input(s): PROCALCITON, LATICACIDVEN in the last 168 hours.  No results found for this or any previous visit (from the past 240 hour(s)).     Radiology Studies: Ct Angio Head W Or Wo Contrast  Result Date: 02/26/2017 CLINICAL DATA:  History of stroke. EXAM: CT ANGIOGRAPHY HEAD AND NECK TECHNIQUE: Multidetector CT imaging of the head and neck was performed using the standard protocol during bolus administration of intravenous contrast. Multiplanar CT image reconstructions and MIPs were obtained to evaluate the vascular anatomy. Carotid stenosis measurements (when applicable) are obtained utilizing NASCET criteria, using the distal internal carotid diameter as the denominator. CONTRAST:   50 cc Isovue 370 intravenous COMPARISON:  Brain MRI from yesterday FINDINGS: CTA NECK FINDINGS Aortic arch: Atheromatous wall thickening. Two vessel branching pattern. Right carotid system: Atheromatous wall thickening with small calcified plaque at the common carotid bifurcation. No flow limiting stenosis or ulceration. Negative for dissection or beading. Left carotid system: Mild atheromatous wall thickening. No stenosis or ulceration. Negative for beading. Vertebral arteries: Proximal subclavian atherosclerosis without flow limiting stenosis. Right dominant vertebral artery. Both vertebral arteries are smooth and widely patent to the dura Skeleton: Remote left parieto-occipital craniotomy, reportedly for hematoma evacuation. Other neck: No significant incidental finding. Upper chest: Negative Review of the MIP images confirms the above findings CTA HEAD FINDINGS Anterior circulation: Calcified and noncalcified plaque along the carotid siphons. Approximately 50% atheromatous narrowing at the left supraclinoid segment. Hypoplastic right A1 segment with robust anterior communicating artery. Fetal type right PCA. No branch occlusion or flow limiting stenosis. Posterior circulation: Atherosclerotic plaque on both V4 segments. Stenosis or occlusion of the left V4 segment beyond the patent pica, which is the not Ake at its origin.  There is also a high-grade narrowing of the right V4 segment with small outpouching likely reflecting atheromatous plaque irregularity. Basilar artery is small in the setting of fetal type right PCA, with mild atheromatous irregularity and narrowing. Mild to moderate narrowing at the basilar left PCA junction. Best seen on axial MIPS, the left PCA is less dense than the right. Atheromatous irregularity of left more than right PCA branches. Venous sinuses: Patent Anatomic variants: As above Delayed phase: No abnormal intracranial enhancement. Review of the MIP images confirms the above  findings IMPRESSION: 1. Atherosclerosis most heavily involving the posterior circulation. 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 These stenoses lead to under opacification of left PCA vessels when compared to the right (which is fetal type). 2. Approximately 50% left supraclinoid ICA stenosis. 3. Atherosclerosis in the neck without flow limiting stenosis or ulceration. Electronically Signed   By: Monte Fantasia M.D.   On: 02/26/2017 13:55   Ct Head Wo Contrast  Result Date: 02/25/2017 CLINICAL DATA:  Blurred vision.  Unsteady gait. EXAM: CT HEAD WITHOUT CONTRAST TECHNIQUE: Contiguous axial images were obtained from the base of the skull through the vertex without intravenous contrast. COMPARISON:  None. FINDINGS: Brain: No acute intracranial abnormality. Specifically, no hemorrhage, hydrocephalus, mass lesion, acute infarction, or significant intracranial injury. Vascular: No hyperdense vessel or unexpected calcification. Skull: Prior craniotomy in the left posterior parietal region. No acute calvarial abnormality. Sinuses/Orbits: Visualized paranasal sinuses and mastoids clear. Orbital soft tissues unremarkable. Other: None IMPRESSION: No acute intracranial abnormality. Electronically Signed   By: Rolm Baptise M.D.   On: 02/25/2017 17:20   Ct Angio Neck W Or Wo Contrast  Result Date: 02/26/2017 CLINICAL DATA:  History of stroke. EXAM: CT ANGIOGRAPHY HEAD AND NECK TECHNIQUE: Multidetector CT imaging of the head and neck was performed using the standard protocol during bolus administration of intravenous contrast. Multiplanar CT image reconstructions and MIPs were obtained to evaluate the vascular anatomy. Carotid stenosis measurements (when applicable) are obtained utilizing NASCET criteria, using the distal internal carotid diameter as the denominator. CONTRAST:  50 cc Isovue 370 intravenous COMPARISON:  Brain MRI from yesterday FINDINGS: CTA NECK FINDINGS Aortic arch:  Atheromatous wall thickening. Two vessel branching pattern. Right carotid system: Atheromatous wall thickening with small calcified plaque at the common carotid bifurcation. No flow limiting stenosis or ulceration. Negative for dissection or beading. Left carotid system: Mild atheromatous wall thickening. No stenosis or ulceration. Negative for beading. Vertebral arteries: Proximal subclavian atherosclerosis without flow limiting stenosis. Right dominant vertebral artery. Both vertebral arteries are smooth and widely patent to the dura Skeleton: Remote left parieto-occipital craniotomy, reportedly for hematoma evacuation. Other neck: No significant incidental finding. Upper chest: Negative Review of the MIP images confirms the above findings CTA HEAD FINDINGS Anterior circulation: Calcified and noncalcified plaque along the carotid siphons. Approximately 50% atheromatous narrowing at the left supraclinoid segment. Hypoplastic right A1 segment with robust anterior communicating artery. Fetal type right PCA. No branch occlusion or flow limiting stenosis. Posterior circulation: Atherosclerotic plaque on both V4 segments. Stenosis or occlusion of the left V4 segment beyond the patent pica, which is the not Ake at its origin. There is also a high-grade narrowing of the right V4 segment with small outpouching likely reflecting atheromatous plaque irregularity. Basilar artery is small in the setting of fetal type right PCA, with mild atheromatous irregularity and narrowing. Mild to moderate narrowing at the basilar left PCA junction. Best seen on axial MIPS, the left  PCA is less dense than the right. Atheromatous irregularity of left more than right PCA branches. Venous sinuses: Patent Anatomic variants: As above Delayed phase: No abnormal intracranial enhancement. Review of the MIP images confirms the above findings IMPRESSION: 1. Atherosclerosis most heavily involving the posterior circulation. There is advanced right V4  stenosis and left V4 occlusion beyond the pica. These stenoses lead to under opacification of left PCA vessels when compared to the right (which is fetal type). 2. Approximately 50% left supraclinoid ICA stenosis. 3. Atherosclerosis in the neck without flow limiting stenosis or ulceration. Electronically Signed   By: Monte Fantasia M.D.   On: 02/26/2017 13:55   [name]  Result Date: 02/25/2017 CLINICAL DATA:  Initial evaluation for intermittent blurry vision, double vision, unsteady gait. Evaluate for stroke. EXAM: MRI HEAD WITHOUT CONTRAST TECHNIQUE: Multiplanar, multiecho pulse sequences of the brain and surrounding structures were obtained without intravenous contrast. COMPARISON:  Prior CT from earlier the same day. FINDINGS: Brain: Age-appropriate cerebral atrophy. Minimal scattered T2/FLAIR hyperintensity within the periventricular and deep white matter both cerebral hemispheres, most like related chronic small vessel ischemic disease, felt to be within normal limits for age. Small remote lacunar infarct present within the left thalamus. 7 mm acute ischemic lacunar type infarct present within the inferior left basal ganglia (series 3, image 24). Mild scattered patchy subcentimeter cortical infarcts present within the left occipital lobe, left PCA distribution (series 3, image 31, 26). Few additional subcentimeter ischemic infarcts noted within the right cerebellar hemisphere (series 3, image 19, 15). No associated hemorrhage or mass effect. No other evidence for acute or subacute ischemia. Gray-white matter differentiation otherwise maintained. No mass lesion, midline shift or mass effect. No hydrocephalus. No extra-axial fluid collection. Major dural sinuses grossly patent. Pituitary gland suprasellar region normal. Midline structures intact and normal. Vascular: Major intracranial vascular flow voids are maintained. Skull and upper cervical spine: Craniocervical junction  normal. Upper cervical spine normal. Bone marrow signal intensity within normal limits. Postoperative changes from prior left parietal craniotomy noted. No acute scalp soft tissue abnormality. Sinuses/Orbits: Globes orbital soft tissues within normal limits. Patient status post lens extraction bilaterally. Paranasal sinuses are clear. Trace opacity noted within the bilateral mastoid air cells, of doubtful significance. Inner ear structures normal. Other: None IMPRESSION: 1. Patchy small volume acute ischemic nonhemorrhagic subcentimeter infarcts involving the left basal ganglia, left parieto-occipital region, and right cerebellar hemisphere as above. 2. Small remote left thalamic lacunar infarct. 3. Sequelae of prior left parietal craniotomy. Electronically Signed   By: Jeannine Boga M.D.   On: 02/25/2017 20:59      Scheduled Meds: . amLODipine  5 mg Oral Daily  . aspirin EC  81 mg Oral Daily  . atorvastatin  40 mg Oral q1800  . enoxaparin (LOVENOX) injection  40 mg Subcutaneous Q24H  . lisinopril  20 mg Oral Daily   Continuous Infusions:   LOS: 0 days    Time spent: 40 minutes   Dessa Phi, DO [facility] [email or web address] Password [email or web address] 02/26/2017, 3:00 PM

## 2017-02-26 NOTE — Consult Note (Signed)
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 Per chart review, patient, and "Justin [name]", patient lives with spouse. One level home 4 steps to entry. Independent retired driving and active prior to admission. Presented 02/25/2017 with intermittent visual disturbances and decrease in balance that have persisted since Christmas 2018. Patient did have recent cataract surgery in August and November 2018. Noted blood pressure with systolic in the 710G. CT of the head reviewed, unremarkable for acute intracranial process. Patient did not receive TPA. MRI the brain showed patchy small-volume acute ischemic nonhemorrhagic subcentimeter infarct left basal ganglia left parietal-occipital region and right cerebellar hemisphere. Small remote left thalamic lacunar infarction. Neurology consulted and workup currently ongoing. CT angiogram head and neck are pending. Echocardiogram with ejection fraction of 26% grade 2 diastolic dysfunction. Currently maintained on aspirin for CVA prophylaxis. Subcutaneous Lovenox for DVT prophylaxis. Tolerating a regular consistency diet.   Review of Systems  Constitutional: Negative for chills and fever.  HENT: Positive for tinnitus.   Eyes: Positive for blurred vision.  Respiratory: Negative for cough and shortness of breath.   Cardiovascular: Negative for chest pain, palpitations and leg swelling.  Gastrointestinal: Negative for constipation, nausea and vomiting.  Genitourinary: Negative for dysuria, hematuria and urgency.  Musculoskeletal: Positive for myalgias.  Neurological: Positive for focal weakness.  All other systems reviewed and are negative.  Past Medical History:  Diagnosis Date  . Calcium  oxalate renal stones   . Chewing tobacco use   . Gallstones    hx/o, s/p choleycystectomy  . H/O echocardiogram 10/2008   no wall thickness, EF 55%, mild mitral regurge  . Hyperlipidemia   . Hypertension    hx/o, noncompliance prior with medication  . Influenza vaccine refused 09/2013  . Rheumatic fever    age 50  . Stroke ([place]) 10/02/2008  . Tinnitus    Past Surgical History:  Procedure Laterality Date  . blood clot  1997   brain  . BRAIN SURGERY  1997   craniotomy, evacuation of hematoma  . CHOLECYSTECTOMY  09/06/2011   Procedure: LAPAROSCOPIC CHOLECYSTECTOMY WITH INTRAOPERATIVE CHOLANGIOGRAM;  Surgeon: Earnstine Regal, MD;  Location: [facility];  Service: General;  Laterality: N/A;  . COLONOSCOPY  01/04/14   normal. Dr. Owens Loffler  . Dental implant    . JOINT REPLACEMENT  2004   LEFT BICEP REPAIR, RTC repair  . ROTATOR CUFF REPAIR     Right  . SIGMOIDOSCOPY     Family History  Problem Relation Age of Onset  . Heart disease Mother 9       MI  . Migraines Mother   . Cirrhosis Brother   . Alcohol abuse Father        lived to age 35  . Diabetes Brother   . Colon cancer Neg Hx   . Cancer Neg Hx   . Stroke Neg Hx   . Hypertension Neg Hx   . Hyperlipidemia Neg Hx    Social History:  reports that  has never smoked. His smokeless tobacco use includes chew. He reports that he does not drink alcohol or use drugs. Allergies: No Known Allergies Medications Prior to Admission  Medication Sig Dispense Refill  . amLODipine (NORVASC) 5 MG tablet Take 5 mg by mouth daily.    Marland Kitchen  aspirin 81 MG chewable tablet Chew 81 mg by mouth daily.    Marland Kitchen lisinopril (PRINIVIL,ZESTRIL) 20 MG tablet Take 20 mg by mouth daily.    . Multiple Vitamins-Minerals (PRESERVISION AREDS 2) CAPS Take 2 capsules by mouth daily.      Home: Home Living Family/patient expects to be discharged to:: Private residence Living Arrangements: Spouse/significant other Available Help at Discharge: Family, Available 24  hours/day Type of Home: House Home Access: Stairs to enter [employer] of Steps: 4 Entrance Stairs-Rails: Can reach both Home Layout: One level Bathroom Shower/Tub: Multimedia programmer: Standard Home Equipment: Careers adviser History: Prior Function Level of Independence: Independent Comments: ADLs, IADLs, driving, retired, enjoys fishing regularly Functional Status:  Mobility: [place] bed mobility: [place] bed mobility comments: Increased time Transfers Overall transfer level: Needs assistance Equipment used: None Transfers: Sit to/from Stand Sit to Stand: Min assist General transfer comment: Min A to stabilize  Ambulation/Gait Ambulation/Gait assistance: Mod assist Ambulation Distance (Feet): 18 Feet Assistive device: 1 person hand held assist Gait Pattern/deviations: Step-through pattern, Staggering right, Staggering left, Ataxic General Gait Details: modA for stability with HHA, c/o of visual disturbance contributing to instability, attempted to close L eye to improve stability, minor improvement. however still requires modA Gait velocity: slowed Gait velocity interpretation: Below normal speed for age/gender    ADL: ADL Overall ADL's : Needs assistance/impaired Eating/Feeding: NPO Grooming: Oral care, Wash/dry face, Min guard, Minimal assistance, Standing Grooming Details (indicate cue type and reason): Min Guard-Min A for standing balance. With static standing, pt able to maintain balance, but as soon as he adjusts his postition, he has LOB and requires physical A to recover. Pt demonstrating decreased depth perception when reaching for targets. Upper Body Bathing: Set up, Sitting Lower Body Bathing: Maximal assistance, Sit to/from stand Upper Body Dressing : Set up, Sitting Lower Body Dressing: Maximal assistance, Sit to/from stand Lower Body Dressing Details (indicate cue type and reason): Max A for  standing balance. Pt donning bedroom slippers at bed level and demosntrating WFL ROM Toilet Transfer: Minimal assistance, Ambulation, Maximal assistance Toilet Transfer Details (indicate cue type and reason): Max A for LOB and to correct. Min A overall Functional mobility during ADLs: Minimal assistance, Maximal assistance General ADL Comments: Pt demosntrating decreased fucnitonal performance and is very upset about change. Pt requiring Min A for stability in standing and Max A to recover from LOB. Pt presenting with diplopia.  Cognition: Cognition Overall Cognitive Status: (WFL for tasks. Will further assess) Orientation Level: Oriented X4 Cognition Arousal/Alertness: Awake/alert Behavior During Therapy: (Upset about performance) Overall Cognitive Status: (WFL for tasks. Will further assess) General Comments: Pt following commands and answering questions without difficulty. With episodes of crying and overwhelmed by situation  Blood pressure (!) 146/78, pulse 66, temperature 97.8 F (36.6 C), temperature source Oral, resp. rate 16, height 5\' 6"  (1.676 m), weight 69 kg (152 lb 1.9 oz), SpO2 100 %. Physical Exam  Vitals reviewed. Constitutional: He is oriented to person, place, and time. He appears well-developed and well-nourished.  67 year old right-handed male sitting up edge of bed  HENT:  Head: Normocephalic and atraumatic.  Eyes: Right eye exhibits no discharge. Left eye exhibits no discharge.  Pupils round and reactive to light  Neck: Normal range of motion. Neck supple. No thyromegaly present.  Cardiovascular: Normal rate, regular rhythm and normal heart sounds.  Respiratory: Effort normal and breath sounds normal. No respiratory distress.  GI: Soft. Bowel sounds are normal. He  exhibits no distension.  Musculoskeletal: He exhibits no edema or tenderness.  No edema or tenderness in extremities  Neurological: He is alert and oriented to person, place, and time.  Followed full  commands Motor: RUE/RLE: 5/5 proximal to distal LUE/LLE: 4+/5 proximal to distal No Ataxia b/l UE Sensation intact to light touch  Skin: Skin is warm and dry.  Psychiatric:  Atypical affect    Results for orders placed or performed during the hospital encounter of 02/25/17 (from the past 24 hour(s))  Protime-INR     Status: None   Collection Time: 02/25/17  4:07 PM  Result Value Ref Range   Prothrombin Time 12.9 11.4 - 15.2 seconds   INR 0.98   APTT     Status: None   Collection Time: 02/25/17  4:07 PM  Result Value Ref Range   aPTT 32 24 - 36 seconds  CBC     Status: None   Collection Time: 02/25/17  4:07 PM  Result Value Ref Range   WBC 5.6 4.0 - 10.5 K/uL   RBC 5.25 4.22 - 5.81 MIL/uL   Hemoglobin 15.2 13.0 - 17.0 g/dL   HCT 44.8 39.0 - 52.0 %   MCV 85.3 78.0 - 100.0 fL   MCH 29.0 26.0 - 34.0 pg   MCHC 33.9 30.0 - 36.0 g/dL   RDW 14.0 11.5 - 15.5 %   Platelets 214 150 - 400 K/uL  Differential     Status: None   Collection Time: 02/25/17  4:07 PM  Result Value Ref Range   Neutrophils Relative % 66 %   Neutro Abs 3.7 1.7 - 7.7 K/uL   Lymphocytes Relative 27 %   Lymphs Abs 1.5 0.7 - 4.0 K/uL   Monocytes Relative 6 %   Monocytes Absolute 0.4 0.1 - 1.0 K/uL   Eosinophils Relative 1 %   Eosinophils Absolute 0.1 0.0 - 0.7 K/uL   Basophils Relative 0 %   Basophils Absolute 0.0 0.0 - 0.1 K/uL  Comprehensive metabolic panel     Status: Abnormal   Collection Time: 02/25/17  4:07 PM  Result Value Ref Range   Sodium 137 135 - 145 mmol/L   Potassium 4.0 3.5 - 5.1 mmol/L   Chloride 101 101 - 111 mmol/L   CO2 25 22 - 32 mmol/L   Glucose, Bld 96 65 - 99 mg/dL   BUN 14 6 - 20 mg/dL   Creatinine, Ser 0.91 0.61 - 1.24 mg/dL   Calcium 9.4 8.9 - 10.3 mg/dL   Total Protein 7.2 6.5 - 8.1 g/dL   Albumin 4.0 3.5 - 5.0 g/dL   AST 19 15 - 41 U/L   ALT 16 (L) 17 - 63 U/L   Alkaline Phosphatase 72 38 - 126 U/L   Total Bilirubin 0.6 0.3 - 1.2 mg/dL   GFR calc non Af Amer >60 >60  mL/min   GFR calc Af Amer >60 >60 mL/min   Anion gap 11 5 - 15  I-stat troponin, ED     Status: None   Collection Time: 02/25/17  4:28 PM  Result Value Ref Range   Troponin i, poc 0.01 0.00 - 0.08 ng/mL   Comment 3          Hemoglobin A1c     Status: Abnormal   Collection Time: 02/26/17  3:28 AM  Result Value Ref Range   Hgb A1c MFr Bld 6.5 (H) 4.8 - 5.6 %   Mean Plasma Glucose 139.85 mg/dL  Lipid panel  Status: Abnormal   Collection Time: 02/26/17  3:28 AM  Result Value Ref Range   Cholesterol 211 (H) 0 - 200 mg/dL   Triglycerides 153 (H) <150 mg/dL   HDL 41 >40 mg/dL   Total CHOL/HDL Ratio 5.1 RATIO   VLDL 31 0 - 40 mg/dL   LDL Cholesterol 139 (H) 0 - 99 mg/dL  CBC     Status: None   Collection Time: 02/26/17  3:28 AM  Result Value Ref Range   WBC 5.2 4.0 - 10.5 K/uL   RBC 4.88 4.22 - 5.81 MIL/uL   Hemoglobin 14.3 13.0 - 17.0 g/dL   HCT 41.3 39.0 - 52.0 %   MCV 84.6 78.0 - 100.0 fL   MCH 29.3 26.0 - 34.0 pg   MCHC 34.6 30.0 - 36.0 g/dL   RDW 13.7 11.5 - 15.5 %   Platelets 183 150 - 400 K/uL  Basic metabolic panel     Status: Abnormal   Collection Time: 02/26/17  3:28 AM  Result Value Ref Range   Sodium 136 135 - 145 mmol/L   Potassium 3.6 3.5 - 5.1 mmol/L   Chloride 104 101 - 111 mmol/L   CO2 24 22 - 32 mmol/L   Glucose, Bld 157 (H) 65 - 99 mg/dL   BUN 12 6 - 20 mg/dL   Creatinine, Ser 0.97 0.61 - 1.24 mg/dL   Calcium 8.7 (L) 8.9 - 10.3 mg/dL   GFR calc non Af Amer >60 >60 mL/min   GFR calc Af Amer >60 >60 mL/min   Anion gap 8 5 - 15  CK     Status: None   Collection Time: 02/26/17  3:28 AM  Result Value Ref Range   Total CK 56 49 - 397 U/L   Ct Head Wo Contrast  Result Date: 02/25/2017 CLINICAL DATA:  Blurred vision.  Unsteady gait. EXAM: CT HEAD WITHOUT CONTRAST TECHNIQUE: Contiguous axial images were obtained from the base of the skull through the vertex without intravenous contrast. COMPARISON:  None. FINDINGS: Brain: No acute intracranial abnormality.  Specifically, no hemorrhage, hydrocephalus, mass lesion, acute infarction, or significant intracranial injury. Vascular: No hyperdense vessel or unexpected calcification. Skull: Prior craniotomy in the left posterior parietal region. No acute calvarial abnormality. Sinuses/Orbits: Visualized paranasal sinuses and mastoids clear. Orbital soft tissues unremarkable. Other: None IMPRESSION: No acute intracranial abnormality. Electronically Signed   By: Rolm Baptise M.D.   On: 02/25/2017 17:20   [name]  Result Date: 02/25/2017 CLINICAL DATA:  Initial evaluation for intermittent blurry vision, double vision, unsteady gait. Evaluate for stroke. EXAM: MRI HEAD WITHOUT CONTRAST TECHNIQUE: Multiplanar, multiecho pulse sequences of the brain and surrounding structures were obtained without intravenous contrast. COMPARISON:  Prior CT from earlier the same day. FINDINGS: Brain: Age-appropriate cerebral atrophy. Minimal scattered T2/FLAIR hyperintensity within the periventricular and deep white matter both cerebral hemispheres, most like related chronic small vessel ischemic disease, felt to be within normal limits for age. Small remote lacunar infarct present within the left thalamus. 7 mm acute ischemic lacunar type infarct present within the inferior left basal ganglia (series 3, image 24). Mild scattered patchy subcentimeter cortical infarcts present within the left occipital lobe, left PCA distribution (series 3, image 31, 26). Few additional subcentimeter ischemic infarcts noted within the right cerebellar hemisphere (series 3, image 19, 15). No associated hemorrhage or mass effect. No other evidence for acute or subacute ischemia. Gray-white matter differentiation otherwise maintained. No mass lesion, midline shift or mass  effect. No hydrocephalus. No extra-axial fluid collection. Major dural sinuses grossly patent. Pituitary gland suprasellar region normal. Midline structures intact and  normal. Vascular: Major intracranial vascular flow voids are maintained. Skull and upper cervical spine: Craniocervical junction normal. Upper cervical spine normal. Bone marrow signal intensity within normal limits. Postoperative changes from prior left parietal craniotomy noted. No acute scalp soft tissue abnormality. Sinuses/Orbits: Globes orbital soft tissues within normal limits. Patient status post lens extraction bilaterally. Paranasal sinuses are clear. Trace opacity noted within the bilateral mastoid air cells, of doubtful significance. Inner ear structures normal. Other: None IMPRESSION: 1. Patchy small volume acute ischemic nonhemorrhagic subcentimeter infarcts involving the left basal ganglia, left parieto-occipital region, and right cerebellar hemisphere as above. 2. Small remote left thalamic lacunar infarct. 3. Sequelae of prior left parietal craniotomy. Electronically Signed   By: Jeannine Boga M.D.   On: 02/25/2017 20:59    Assessment/Plan: Diagnosis: Left basal ganglia left parietal-occipital region and right cerebellar hemisphere infarcts Labs and images independently reviewed.  Records reviewed and summated above. Stroke: Continue secondary stroke prophylaxis and Risk Factor Modification listed below:   Antiplatelet therapy:   Blood Pressure Management:  Continue current medication with prn's with permisive HTN per primary team Statin Agent:   Prediabetes management:    1. Does the need for close, 24 hr/day medical supervision in concert with the patient's rehab needs make it unreasonable for this patient to be served in a less intensive setting? Yes  2. Co-Morbidities requiring supervision/potential complications: combined CHF (Monitor in accordance with increased physical activity and avoid UE resistance excercises), HTN (monitor and provide prns in accordance with increased physical exertion and pain), traumatic ICH with craniotomy (?baseline cognitive deficits), CVA  without residual deficits, prediabetes (Monitor in accordance with exercise and adjust meds as necessary) 3. Due to safety, disease management and patient education, does the patient require 24 hr/day rehab nursing? Yes 4. Does the patient require coordinated care of a physician, rehab nurse, PT (1-2 hrs/day, 5 days/week), OT (1-2 hrs/day, 5 days/week) and SLP (1-2 hrs/day, 5 days/week) to address physical and functional deficits in the context of the above medical diagnosis(es)? Yes Addressing deficits in the following areas: balance, endurance, locomotion, strength, transferring, bathing, dressing, toileting, cognition and psychosocial support 5. Can the patient actively participate in an intensive therapy program of at least 3 hrs of therapy per day at least 5 days per week? Yes 6. The potential for patient to make measurable gains while on inpatient rehab is excellent 7. Anticipated functional outcomes upon discharge from inpatient rehab are supervision  with PT, supervision with OT, supervision with SLP. 8. Estimated rehab length of stay to reach the above functional goals is: 9-13 days. 9. Anticipated D/C setting: Home 10. Anticipated post D/C treatments: HH therapy and Home excercise program 11. Overall Rehab/Functional Prognosis: good  RECOMMENDATIONS: This patient's condition is appropriate for continued rehabilitative care in the following setting: CIR after completion of medical workup Patient has agreed to participate in recommended program. Yes Note that insurance prior authorization may be required for reimbursement for recommended care.  Comment: Rehab Admissions Coordinator to follow up.  Delice Lesch, MD, ABPMR Lavon Paganini Angiulli, PA-C 02/26/2017

## 2017-02-26 NOTE — Progress Notes (Signed)
*  PRELIMINARY RESULTS* Echocardiogram 2D Echocardiogram has been performed.  Leavy Cella 02/26/2017, 9:19 AM

## 2017-02-26 NOTE — Progress Notes (Signed)
    [facility] has been requested to perform a transesophageal echocardiogram on Justin [name] for stroke.  After careful review of history and examination, the risks and benefits of transesophageal echocardiogram have been explained including risks of esophageal damage, perforation (1:10,000 risk), bleeding, pharyngeal hematoma as well as other potential complications associated with conscious sedation including aspiration, arrhythmia, respiratory failure and death. Alternatives to treatment were discussed, questions were answered. Patient is willing to proceed.   His procedure is scheduled for 15:00 tomorrow with Dr. Debara Pickett.   Daune Perch, NP  02/26/2017 5:27 PM

## 2017-02-26 NOTE — Evaluation (Signed)
Occupational Therapy Evaluation Patient Details Name: Justin [name] MRN: [id number] DOB: 24-Mar-1950 Today's Date: 02/26/2017    History of Present Illness 67 y.o. 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  Patient reports having cataract surgery in August and November 2018.  He reports having blurry vision and/or double vision that lasts a few seconds and then self resolved. MRI showing small volume acute ischemic nonhemorrhagic subcentimeter infarcts involving the left basal ganglia, left parieto-occipital region, and right cerebellar hemisphere as above.   Clinical Impression   PTA, pt was living with his wife and was independent. Pt currently requiring Min A for ADLs in standing, Max A for LB ADLs, and Min-Max A for functional mobility. Pt present with diplopia and poor balance. Pt highly motivated to return to PLOF and has good family support. Pt will require further acute OT to increase safety and independence with ADLs, IADLs, and functional mobility. Recommend dc to CIR for intensive OT to optimize return to PLOF.    Follow Up Recommendations  CIR;Supervision/Assistance - 24 hour    Equipment Recommendations  Other (comment)(Defer to next venue)    Recommendations for Other Services PT consult;Rehab consult     Precautions / Restrictions Precautions Precautions: Fall;Other (comment)(Diplopia) Restrictions Weight Bearing Restrictions: No      Mobility Bed Mobility Overal bed mobility: Modified Independent             General bed mobility comments: Increased time  Transfers Overall transfer level: Needs assistance Equipment used: None Transfers: Sit to/from Stand Sit to Stand: Min assist         General transfer comment: Min A to stabilize      Balance Overall balance assessment: Needs assistance Sitting-balance support: No upper extremity supported;Feet supported Sitting balance-Leahy Scale: Fair     Standing balance support: No upper extremity supported;During functional activity Standing balance-Leahy Scale: Poor Standing balance comment: Reliant on physical A to correct LOB                           ADL either performed or assessed with clinical judgement   ADL Overall ADL's : Needs assistance/impaired Eating/Feeding: NPO   Grooming: Oral care;Wash/dry face;Min guard;Minimal assistance;Standing Grooming Details (indicate cue type and reason): Min Guard-Min A for standing balance. With static standing, pt able to maintain balance, but as soon as he adjusts his postition, he has LOB and requires physical A to recover. Pt demonstrating decreased depth perception when reaching for targets. Upper Body Bathing: Set up;Sitting   Lower Body Bathing: Maximal assistance;Sit to/from stand   Upper Body Dressing : Set up;Sitting   Lower Body Dressing: Maximal assistance;Sit to/from stand Lower Body Dressing Details (indicate cue type and reason): Max A for standing balance. Pt donning bedroom slippers at bed level and demosntrating WFL ROM Toilet Transfer: Minimal assistance;Ambulation;Maximal assistance Toilet Transfer Details (indicate cue type and reason): Max A for LOB and to correct. Min A overall         Functional mobility during ADLs: Minimal assistance;Maximal assistance General ADL Comments: Pt demosntrating decreased fucnitonal performance and is very upset about change. Pt requiring Min A for stability in standing and Max A to recover from LOB. Pt presenting with diplopia.     Vision Baseline Vision/History: Wears glasses  Wears Glasses: Reading only Patient Visual Report: Blurring of vision;Diplopia Vision Assessment?: Yes;Vision impaired- to be further tested in functional context Eye Alignment:  Within Functional Limits Ocular Range of Motion: Within Functional Limits Alignment/Gaze Preference: Within Defined Limits Diplopia Assessment: Disappears with one eye closed;Objects split side to side;Present in far gaze;Other (comment)(Diplopia consistant with L and R gaze) Depth Perception: Undershoots Additional Comments: Right eye dominant. Diplopia resolves when closing one eye.      Perception     Praxis      Pertinent Vitals/Pain Pain Assessment: No/denies pain     Hand Dominance Right   Extremity/Trunk Assessment Upper Extremity Assessment Upper Extremity Assessment: LUE deficits/detail LUE Deficits / Details: Slight decreased strength compared to RUE. Pt requiring numbness in fingertips. Decreased coordoniation as seen during grooming and finger-to-nose test LUE Sensation: (Reports numbness) LUE Coordination: decreased fine motor;decreased gross motor   Lower Extremity Assessment Lower Extremity Assessment: Defer to PT evaluation   Cervical / Trunk Assessment Cervical / Trunk Assessment: Normal   Communication Communication Communication: No difficulties   Cognition Arousal/Alertness: Awake/alert Behavior During Therapy: (Upset about performance) Overall Cognitive Status: (WFL for tasks. Will further assess)                                 General Comments: Pt following commands and answering questions without difficulty. With episodes of crying and overwhelmed by situation   General Comments  Wife present during session    Exercises     Shoulder Instructions      Home Living Family/patient expects to be discharged to:: Private residence Living Arrangements: Spouse/significant other Available Help at Discharge: Family;Available 24 hours/day Type of Home: House Home Access: Stairs to enter [employer] of Steps: 4 Entrance Stairs-Rails: Can reach both Home Layout: One level     Bathroom Shower/Tub: Radiographer, therapeutic: Standard     Home Equipment: Shower seat          Prior Functioning/Environment Level of Independence: Independent        Comments: ADLs, IADLs, driving, retired, enjoys fishing regularly        OT Problem List: Decreased activity tolerance;Impaired balance (sitting and/or standing);Impaired vision/perception;Decreased coordination;Decreased safety awareness      OT Treatment/Interventions: Self-care/ADL training;Therapeutic exercise;Energy conservation;DME and/or AE instruction;Therapeutic activities;Patient/family education    OT Goals(Current goals can be found in the care plan section) Acute Rehab OT Goals Patient Stated Goal: Get back to PLOF OT Goal Formulation: With patient/family Time For Goal Achievement: 03/12/17 Potential to Achieve Goals: Good ADL Goals Pt Will Perform Grooming: standing;with modified independence Pt Will Perform Upper Body Dressing: Independently;sitting Pt Will Perform Lower Body Dressing: with modified independence;sit to/from stand Pt Will Transfer to Toilet: with modified independence;ambulating;regular height toilet  OT Frequency: Min 3X/week   Barriers to D/C:            Co-evaluation              AM-PAC PT "6 Clicks" Daily Activity     Outcome Measure Help from another person eating meals?: Total Help from another person taking care of personal grooming?: A Little Help from another person toileting, which includes using toliet, bedpan, or urinal?: A Little Help from another person bathing (including washing, rinsing, drying)?: A Lot Help from another person to put on and taking off regular upper body clothing?: A Little Help from another person to put on and taking off  regular lower body clothing?: A Lot 6 Click Score: 14   End of Session Equipment Utilized During Treatment: Gait belt Nurse Communication: Mobility status  Activity Tolerance: Patient tolerated treatment well Patient left: in bed;with call  bell/phone within reach;with bed alarm set;with family/visitor present  OT Visit Diagnosis: Unsteadiness on feet (R26.81);Other abnormalities of gait and mobility (R26.89);Muscle weakness (generalized) (M62.81);Low vision, both eyes (H54.2)                Time: 6599-3570 OT Time Calculation (min): 24 min Charges:  OT General Charges $OT Visit: 1 Visit OT Evaluation $OT Eval Moderate Complexity: 1 Mod OT Treatments $Self Care/Home Management : 8-22 mins G-Codes:     Camp Swift, OTR/L Acute Rehab Pager: [phone] Office: [place] 02/26/2017, 9:23 AM

## 2017-02-26 NOTE — Care Management Note (Signed)
Case Management Note  Patient Details  Name: Justin [name] MRN: [id number] Date of Birth: 1950-06-21  Subjective/Objective:      Pt admitted with CVA. He is from home with his spouse.              Action/Plan: PT/OT recommending CIR. CM following for d/c disposition.  Expected Discharge Date:                  Expected Discharge Plan:  [place]  In-House Referral:     Discharge planning Services  CM Consult  Post Acute Care Choice:    Choice offered to:     DME Arranged:    DME Agency:     HH Arranged:    [place] Agency:     Status of Service:  In process, will continue to follow  If discussed at Long Length of Stay Meetings, dates discussed:    Additional Comments:  Pollie Friar, RN 02/26/2017, 12:57 PM

## 2017-02-26 NOTE — Consult Note (Signed)
Referring Physician: Dr. Tomi Bamberger    Chief Complaint: Intermittent double vision and new right inferior quadrantanopsia  HPI: Justin [name] is an 67 y.o. male who presented to the ED on Tuesday after being sent from his ophthalmologist's office for evaluation and management of a possible new occipitoparietal stroke. The patient presented to his ophthalmologist for evaluation of intermittent vertical binocular diplopia, which had been occurring since December. At the ophthamologist's office, visual field testing revealed a mild right homonymous inferior quadrantanopsia, which was felt by the ophthalmologist to most likely be due to a new left parietal or occipital lobe stroke.   MRI obtained in the ED revealed patchy acute ischemic nonhemorrhagic subcentimeter infarcts involving the left basal ganglia, left parieto-occipital region and right cerebellar hemisphere. A small remote left thalamic lacunar infarct and sequelae of prior left parietal craniotomy were also noted.   EKG in the ED reveals no atrial fibrillation.   His PMHx includes HTN, HLD, DM2, traumatic ICH s/p craniotomy in 1997, CVA in 2010 with right sided weakness that subsequently resolved with physical therapy and rheumatic fever at age 33. Of note, he uses chewing tobacco regularly ("[employer]" brand).    Past Medical History:  Diagnosis Date  . Calcium oxalate renal stones   . Chewing tobacco use   . Gallstones    hx/o, s/p choleycystectomy  . H/O echocardiogram 10/2008   no wall thickness, EF 55%, mild mitral regurge  . Hyperlipidemia   . Hypertension    hx/o, noncompliance prior with medication  . Influenza vaccine refused 09/2013  . Rheumatic fever    age 60  . Stroke ([place]) 10/02/2008  . Tinnitus     Past Surgical History:  Procedure Laterality Date  . blood clot  1997   brain  . BRAIN SURGERY  1997   craniotomy, evacuation of hematoma  . CHOLECYSTECTOMY  09/06/2011   Procedure: LAPAROSCOPIC CHOLECYSTECTOMY WITH  INTRAOPERATIVE CHOLANGIOGRAM;  Surgeon: Earnstine Regal, MD;  Location: [facility] ORS;  Service: General;  Laterality: N/A;  . COLONOSCOPY  01/04/14   normal. Dr. Owens Loffler  . Dental implant    . JOINT REPLACEMENT  2004   LEFT BICEP REPAIR, RTC repair  . ROTATOR CUFF REPAIR     Right  . SIGMOIDOSCOPY      Family History  Problem Relation Age of Onset  . Heart disease Mother 54       MI  . Migraines Mother   . Cirrhosis Brother   . Alcohol abuse Father        lived to age 25  . Diabetes Brother   . Colon cancer Neg Hx   . Cancer Neg Hx   . Stroke Neg Hx   . Hypertension Neg Hx   . Hyperlipidemia Neg Hx    Social History:  reports that  has never smoked. His smokeless tobacco use includes chew. He reports that he does not drink alcohol or use drugs.  Allergies: No Known Allergies  Home Medications:  Amlodipine ASA 81 mg qd intermittently Lisinopril MVI  ROS: Left frontal headache now resolved. No neck pain, chest pain, abdominal pain or limb pain. Denies current diplopia. States his blurred vision is now resolved. Other ROS as per HPI.   Physical Examination: Blood pressure 132/75, pulse (!) 57, temperature 98 F (36.7 C), temperature source Oral, resp. rate 10, height '5\' 6"'  (1.676 m), weight 68 kg (150 lb), SpO2 98 %.  HEENT: Normocephalic Lungs: Respirations unlabored Ext: No edema  Neurologic Examination:  Mental Status: Alert, oriented, thought content appropriate.  Speech fluent without evidence of aphasia.  Able to follow all commands without difficulty. Cranial Nerves: II:  Visual fields intact in all 4 quadrants of each eye on bedside confrontation testing. PERRL.   III,IV, VI: EOMI without nystagmus. No ptosis.  V,VII: No facial droop. Facial temp sensation normal bilaterally.  VIII: Hearing intact to conversation.  IX,X: Palate rises symmetrically XI: Symmetric XII: midline tongue extension  Motor: Right : Upper extremity   5/5    Left:     Upper extremity    5/5  Lower extremity   5/5     Lower extremity   5/5 Normal tone throughout; no atrophy noted No pronator drift. Sensory: Temp and light touch intact x 4. No extinction.  Deep Tendon Reflexes:  RUE 1+ brachioradialis and biceps LUE 2+ brachioradialis and biceps Patellae 2+ bilaterally Achilles 1+ bilaterally Toes downgoing Cerebellar: No ataxia with FNF bilaterally Gait: Deferred  Results for orders placed or performed during the hospital encounter of 02/25/17 (from the past 48 hour(s))  Protime-INR     Status: None   Collection Time: 02/25/17  4:07 PM  Result Value Ref Range   Prothrombin Time 12.9 11.4 - 15.2 seconds   INR 0.98   APTT     Status: None   Collection Time: 02/25/17  4:07 PM  Result Value Ref Range   aPTT 32 24 - 36 seconds  CBC     Status: None   Collection Time: 02/25/17  4:07 PM  Result Value Ref Range   WBC 5.6 4.0 - 10.5 K/uL   RBC 5.25 4.22 - 5.81 MIL/uL   Hemoglobin 15.2 13.0 - 17.0 g/dL   HCT 44.8 39.0 - 52.0 %   MCV 85.3 78.0 - 100.0 fL   MCH 29.0 26.0 - 34.0 pg   MCHC 33.9 30.0 - 36.0 g/dL   RDW 14.0 11.5 - 15.5 %   Platelets 214 150 - 400 K/uL  Differential     Status: None   Collection Time: 02/25/17  4:07 PM  Result Value Ref Range   Neutrophils Relative % 66 %   Neutro Abs 3.7 1.7 - 7.7 K/uL   Lymphocytes Relative 27 %   Lymphs Abs 1.5 0.7 - 4.0 K/uL   Monocytes Relative 6 %   Monocytes Absolute 0.4 0.1 - 1.0 K/uL   Eosinophils Relative 1 %   Eosinophils Absolute 0.1 0.0 - 0.7 K/uL   Basophils Relative 0 %   Basophils Absolute 0.0 0.0 - 0.1 K/uL  Comprehensive metabolic panel     Status: Abnormal   Collection Time: 02/25/17  4:07 PM  Result Value Ref Range   Sodium 137 135 - 145 mmol/L   Potassium 4.0 3.5 - 5.1 mmol/L    Comment: SLIGHT HEMOLYSIS   Chloride 101 101 - 111 mmol/L   CO2 25 22 - 32 mmol/L   Glucose, Bld 96 65 - 99 mg/dL   BUN 14 6 - 20 mg/dL   Creatinine, Ser 0.91 0.61 - 1.24 mg/dL   Calcium 9.4 8.9 - 10.3 mg/dL    Total Protein 7.2 6.5 - 8.1 g/dL   Albumin 4.0 3.5 - 5.0 g/dL   AST 19 15 - 41 U/L   ALT 16 (L) 17 - 63 U/L   Alkaline Phosphatase 72 38 - 126 U/L   Total Bilirubin 0.6 0.3 - 1.2 mg/dL   GFR calc non Af Amer >60 >60 mL/min   GFR calc Af  Amer >60 >60 mL/min    Comment: (NOTE) The eGFR has been calculated using the CKD EPI equation. This calculation has not been validated in all clinical situations. eGFR's persistently <60 mL/min signify possible Chronic Kidney Disease.    Anion gap 11 5 - 15  I-stat troponin, ED     Status: None   Collection Time: 02/25/17  4:28 PM  Result Value Ref Range   Troponin i, poc 0.01 0.00 - 0.08 ng/mL   Comment 3            Comment: Due to the release kinetics of cTnI, a negative result within the first hours of the onset of symptoms does not rule out myocardial infarction with certainty. If myocardial infarction is still suspected, repeat the test at appropriate intervals.    Ct Head Wo Contrast  Result Date: 02/25/2017 CLINICAL DATA:  Blurred vision.  Unsteady gait. EXAM: CT HEAD WITHOUT CONTRAST TECHNIQUE: Contiguous axial images were obtained from the base of the skull through the vertex without intravenous contrast. COMPARISON:  None. FINDINGS: Brain: No acute intracranial abnormality. Specifically, no hemorrhage, hydrocephalus, mass lesion, acute infarction, or significant intracranial injury. Vascular: No hyperdense vessel or unexpected calcification. Skull: Prior craniotomy in the left posterior parietal region. No acute calvarial abnormality. Sinuses/Orbits: Visualized paranasal sinuses and mastoids clear. Orbital soft tissues unremarkable. Other: None IMPRESSION: No acute intracranial abnormality. Electronically Signed   By: Rolm Baptise M.D.   On: 02/25/2017 17:20   [name]  Result Date: 02/25/2017 CLINICAL DATA:  Initial evaluation for intermittent blurry vision, double vision, unsteady gait. Evaluate for stroke.  EXAM: MRI HEAD WITHOUT CONTRAST TECHNIQUE: Multiplanar, multiecho pulse sequences of the brain and surrounding structures were obtained without intravenous contrast. COMPARISON:  Prior CT from earlier the same day. FINDINGS: Brain: Age-appropriate cerebral atrophy. Minimal scattered T2/FLAIR hyperintensity within the periventricular and deep white matter both cerebral hemispheres, most like related chronic small vessel ischemic disease, felt to be within normal limits for age. Small remote lacunar infarct present within the left thalamus. 7 mm acute ischemic lacunar type infarct present within the inferior left basal ganglia (series 3, image 24). Mild scattered patchy subcentimeter cortical infarcts present within the left occipital lobe, left PCA distribution (series 3, image 31, 26). Few additional subcentimeter ischemic infarcts noted within the right cerebellar hemisphere (series 3, image 19, 15). No associated hemorrhage or mass effect. No other evidence for acute or subacute ischemia. Gray-white matter differentiation otherwise maintained. No mass lesion, midline shift or mass effect. No hydrocephalus. No extra-axial fluid collection. Major dural sinuses grossly patent. Pituitary gland suprasellar region normal. Midline structures intact and normal. Vascular: Major intracranial vascular flow voids are maintained. Skull and upper cervical spine: Craniocervical junction normal. Upper cervical spine normal. Bone marrow signal intensity within normal limits. Postoperative changes from prior left parietal craniotomy noted. No acute scalp soft tissue abnormality. Sinuses/Orbits: Globes orbital soft tissues within normal limits. Patient status post lens extraction bilaterally. Paranasal sinuses are clear. Trace opacity noted within the bilateral mastoid air cells, of doubtful significance. Inner ear structures normal. Other: None IMPRESSION: 1. Patchy small volume acute ischemic nonhemorrhagic subcentimeter infarcts  involving the left basal ganglia, left parieto-occipital region, and right cerebellar hemisphere as above. 2. Small remote left thalamic lacunar infarct. 3. Sequelae of prior left parietal craniotomy. Electronically Signed   By: Jeannine Boga M.D.   On: 02/25/2017 20:59    Assessment: 67 y.o. male with intermittent double vision and new right inferior  quadrantanopsia seen by ophthalmology on Tuesday. MRI reveals acute ischemic infarctions in more than one vascular territory 1. Neurological examination is nonfocal. No quadrantanopsia is appreciated with bedside visual confrontation testing.  2. MRI reveals patchy acute ischemic subcentimeter infarcts involving the left basal ganglia, left parieto-occipital region and right cerebellar hemisphere. A small remote left thalamic lacunar infarct and sequelae of prior left parietal craniotomy were also noted. Based on the multiple vascular territories involved, cardioembolic phenomenon is suspected.  3. Stroke Risk Factors - HTN, HLD, DM2, prior CVA, history of rheumatic fever and chewing tobacco use 4. Not classifiable as having failed ASA therapy, as he states that he uses this medication intermittently.  5. History of traumatic ICH s/p craniotomy in 1997  Plan: 1. HgbA1c, fasting lipid panel 2. MRA of the brain without contrast 3. PT consult, OT consult, Speech consult 4. TTE. If negative for mural thrombus, left atrial appendage thrombus or valvular vegetation, obtain TEE.  5. Carotid dopplers 6. ASA 81 mg po qd for now. May need to switch to anticoagulation pending further work up, provided that he does not have SBE.  7. Risk factor modification. Although literature review reveals that chewing tobacco has a lower risk of cardiovascular morbidity than smoking, it still has some cardiovascular risk associated with its use, most likely due to the nicotine content; it also presents a cancer risk; the patient should be counseled to quit.  8.  Telemetry monitoring. If negative for a-fib, and if remainder of work up is also negative, he may be a candidate for a loop recorder.  9. Frequent neuro checks 10. Start atorvastatin 40 mg po qd. Obtain baseline CK level.  11. Blood culture x 2 given strokes in multiple vascular territories and history of rheumatic fever, which puts the patient at risk for cardiac valvular vegetation and SBE.    '@Electronically'  signed: Dr. Kerney Elbe  02/26/2017, 12:20 AM

## 2017-02-26 NOTE — Progress Notes (Signed)
I will begin insurance approval with [employer] in the morning and begin discussions with pt and family of goals and expectations of an inpt rehab admission. CIR admission pending insurance approval. [phone]

## 2017-02-26 NOTE — Evaluation (Signed)
Physical Therapy Evaluation Patient Details Name: Justin [name] MRN: [id number] DOB: 24-Apr-1950 Today's Date: 02/26/2017   History of Present Illness  67 y.o. 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  Patient reports having cataract surgery in August and November 2018.  He reports having blurry vision and/or double vision that lasts a few seconds and then self resolved. MRI showing small volume acute ischemic nonhemorrhagic subcentimeter infarcts involving the left basal ganglia, left parieto-occipital region, and right cerebellar hemisphere as above.  Clinical Impression  PTA pt independent in all aspects of daily life. Pt is limited by visual impairment, decreased strength, decreased balance, and decreased coordination. Pt is currently mod I for bed mobility, minA for transfers and modA for ambulation with HHA. Pt very motivated to return to his PLOF, and has 24 hour support from his wife at discharge making him an excellent candidate for CIR level rehab. PT will continue to follow acutely until d/c.     Follow Up Recommendations CIR    Equipment Recommendations  Other (comment)(to be determined at next venue)    Recommendations for Other Services Rehab consult     Precautions / Restrictions Precautions Precautions: Fall;Other (comment)(Diplopia) Restrictions Weight Bearing Restrictions: No      Mobility  Bed Mobility Overal bed mobility: Modified Independent             General bed mobility comments: Increased time  Transfers Overall transfer level: Needs assistance Equipment used: None Transfers: Sit to/from Stand Sit to Stand: Min assist         General transfer comment: Min A to stabilize   Ambulation/Gait Ambulation/Gait assistance:  Mod assist Ambulation Distance (Feet): 18 Feet Assistive device: 1 person hand held assist Gait Pattern/deviations: Step-through pattern;Staggering right;Staggering left;Ataxic Gait velocity: slowed Gait velocity interpretation: Below normal speed for age/gender General Gait Details: modA for stability with HHA, c/o of visual disturbance contributing to instability, attempted to close L eye to improve stability, minor improvement. however still requires modA   Modified Rankin (Stroke Patients Only) Modified Rankin (Stroke Patients Only) Pre-Morbid Rankin Score: No significant disability Modified Rankin: Moderately severe disability     Balance Overall balance assessment: Needs assistance Sitting-balance support: No upper extremity supported;Feet supported Sitting balance-Leahy Scale: Fair     Standing balance support: No upper extremity supported;During functional activity Standing balance-Leahy Scale: Poor Standing balance comment: Reliant on physical A to correct LOB                             Pertinent Vitals/Pain Pain Assessment: No/denies pain    Home Living Family/patient expects to be discharged to:: Private residence Living Arrangements: Spouse/significant other Available Help at Discharge: Family;Available 24 hours/day Type of Home: House Home Access: Stairs to enter Entrance Stairs-Rails: Can reach both Entrance Stairs-Number of Steps: 4 Home Layout: One level Home Equipment: Shower seat      Prior Function Level of Independence: Independent         Comments: ADLs, IADLs, driving, retired, enjoys fishing regularly     Journalist, newspaper   Dominant Hand: Right    Extremity/Trunk Assessment   Upper Extremity Assessment Upper Extremity Assessment: LUE deficits/detail LUE Deficits / Details: Slight decreased strength compared to RUE. Pt requiring numbness in fingertips. Decreased  coordoniation as seen during grooming and finger-to-nose  test LUE Sensation: (Reports numbness) LUE Coordination: decreased fine motor;decreased gross motor    Lower Extremity Assessment Lower Extremity Assessment: LLE deficits/detail LLE Deficits / Details: ROM WFL, hip strength grossly 4/5, knee and ankle strength grossly 4+/5 LLE Sensation: (intact) LLE Coordination: decreased fine motor;decreased gross motor    Cervical / Trunk Assessment Cervical / Trunk Assessment: Normal  Communication   Communication: No difficulties  Cognition Arousal/Alertness: Awake/alert Behavior During Therapy: (Upset about performance) Overall Cognitive Status: (WFL for tasks. Will further assess)                                 General Comments: Pt following commands and answering questions without difficulty. With episodes of crying and overwhelmed by situation      General Comments General comments (skin integrity, edema, etc.): pt became very tearful with ambulation because of decreased ability, Wife present during asssesment     Assessment/Plan    PT Assessment Patient needs continued PT services  PT Problem List Decreased strength;Decreased activity tolerance;Decreased balance;Decreased mobility;Decreased coordination       PT Treatment Interventions DME instruction;Gait training;Stair training;Functional mobility training;Therapeutic activities;Therapeutic exercise;Balance training;Neuromuscular re-education;Cognitive remediation;Patient/family education    PT Goals (Current goals can be found in the Care Plan section)  Acute Rehab PT Goals Patient Stated Goal: Get back to PLOF PT Goal Formulation: With patient Time For Goal Achievement: 03/12/17 Potential to Achieve Goals: Good    Frequency Min 4X/week    AM-PAC PT "6 Clicks" Daily Activity  Outcome Measure Difficulty turning over in bed (including adjusting bedclothes, sheets and blankets)?: A Little Difficulty moving from lying on back to sitting on the side of the  bed? : A Little Difficulty sitting down on and standing up from a chair with arms (e.g., wheelchair, bedside commode, etc,.)?: Unable Help needed moving to and from a bed to chair (including a wheelchair)?: A Little Help needed walking in hospital room?: A Lot Help needed climbing 3-5 steps with a railing? : Total 6 Click Score: 13    End of Session Equipment Utilized During Treatment: Gait belt Activity Tolerance: Patient tolerated treatment well Patient left: in chair;with call bell/phone within reach;with chair alarm set;with family/visitor present;with nursing/sitter in room Nurse Communication: Mobility status PT Visit Diagnosis: Unsteadiness on feet (R26.81);Other abnormalities of gait and mobility (R26.89);Muscle weakness (generalized) (M62.81);Difficulty in walking, not elsewhere classified (R26.2);Ataxic gait (R26.0);Other symptoms and signs involving the nervous system (R29.898)    Time: 0950-1020 PT Time Calculation (min) (ACUTE ONLY): 30 min   Charges:   PT Evaluation $PT Eval Moderate Complexity: 1 Mod PT Treatments $Gait Training: 8-22 mins   PT G Codes:        Erina Hamme B. Migdalia Dk PT, DPT Acute Rehabilitation  [phone] Pager [phone]    [place] 02/26/2017, 10:43 AM

## 2017-02-26 NOTE — Progress Notes (Signed)
NEUROHOSPITALISTS STROKE TEAM - DAILY PROGRESS NOTE   ADMISSION HISTORY:  Justin [name] is an 67 y.o. male who presented to the ED on Tuesday after being sent from his ophthalmologist's office for evaluation and management of a possible new occipitoparietal stroke. The patient presented to his ophthalmologist for evaluation of intermittent vertical binocular diplopia, which had been occurring since December. At the ophthamologist's office, visual field testing revealed a mild right homonymous inferior quadrantanopsia, which was felt by the ophthalmologist to most likely be due to a new left parietal or occipital lobe stroke.   SUBJECTIVE (INTERVAL HISTORY) Family is at the bedside. Patient is found laying in bed in NAD. Overall he feels his condition is stable. Voices no new complaints. No new events reported overnight.   OBJECTIVE Lab Results: CBC:  Recent Labs  Lab 02/25/17 1607 02/26/17 0328  WBC 5.6 5.2  HGB 15.2 14.3  HCT 44.8 41.3  MCV 85.3 84.6  PLT 214 183   BMP: Recent Labs  Lab 02/25/17 1607 02/26/17 0328  NA 137 136  K 4.0 3.6  CL 101 104  CO2 25 24  GLUCOSE 96 157*  BUN 14 12  CREATININE 0.91 0.97  CALCIUM 9.4 8.7*   Liver Function Tests:  Recent Labs  Lab 02/25/17 1607  AST 19  ALT 16*  ALKPHOS 72  BILITOT 0.6  PROT 7.2  ALBUMIN 4.0   Cardiac Enzymes:  Recent Labs  Lab 02/26/17 0328  CKTOTAL 56   Coagulation Studies:  Recent Labs    02/25/17 1607  APTT 32  INR 0.98   PHYSICAL EXAM Temp:  [97.5 F (36.4 C)-98.3 F (36.8 C)] 97.5 F (36.4 C) (01/30 1406) Pulse Rate:  [54-73] 60 (01/30 1406) Resp:  [10-18] 15 (01/30 1406) BP: (119-162)/(54-120) 134/59 (01/30 1406) SpO2:  [96 %-100 %] 96 % (01/30 1406) Weight:  [68 kg (150 lb)-69 kg (152 lb 1.9 oz)] 69 kg (152 lb 1.9 oz) (01/30 0022) General - Well nourished, well developed, in no apparent distress HEENT-  Normocephalic,      Cardiovascular - Regular rate and rhythm  Respiratory - Lungs clear bilaterally. No wheezing. Abdomen - soft and non-tender, BS normal Extremities- no edema or cyanosis Neurologic Examination: Mental Status: Alert, oriented, thought content appropriate.  Speech fluent without evidence of aphasia.  Able to follow all commands without difficulty. Cranial Nerves: II:  Visual fields showed right lower quadrantanopia. PERRL.   III,IV, VI: EOMI without nystagmus. No ptosis.  V,VII: No facial droop. Facial temp sensation normal bilaterally.  VIII: Hearing intact to conversation.  IX,X: Palate rises symmetrically XI: Symmetric XII: midline tongue extension  Motor: Right : Upper extremity   5/5    Left:     Upper extremity   5/5  Lower extremity   5/5     Lower extremity   5/5 Normal tone throughout; no atrophy noted No pronator drift. Sensory: Temp and light touch intact x 4. No extinction.  Deep Tendon Reflexes:  RUE 1+ brachioradialis and biceps LUE 2+ brachioradialis and biceps Patellae 2+ bilaterally Achilles 1+ bilaterally Toes downgoing Cerebellar: No ataxia with FNF bilaterally Gait: Deferred  IMAGING: I have personally reviewed the radiological images below and agree with the radiology interpretations.  Ct Head Wo Contrast Result Date: 02/25/2017 IMPRESSION: No acute intracranial abnormality. Electronically Signed   By: Rolm Baptise M.D.   On: 02/25/2017 17:20   Ct Angio Head/Neck W Or Wo Contrast Result Date: 02/26/2017 IMPRESSION: 1. Atherosclerosis most heavily involving  the posterior circulation. 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 These stenoses lead to under opacification of left PCA vessels when compared to the right (which is fetal type). 2. Approximately 50% left supraclinoid ICA stenosis. 3. Atherosclerosis in the neck without flow limiting stenosis or ulceration. Electronically Signed   By: Monte Fantasia M.D.   On: 02/26/2017  13:55   Justin Brain Wo Contrast (neuro Protocol)  Result Date: 02/25/2017 IMPRESSION: 1. Patchy small volume acute ischemic nonhemorrhagic subcentimeter infarcts involving the left basal ganglia, left parieto-occipital region, and right cerebellar hemisphere as above. 2. Small remote left thalamic lacunar infarct. 3. Sequelae of prior left parietal craniotomy. Electronically Signed   By: Jeannine Boga M.D.   On: 02/25/2017 20:59   Echocardiogram:                                               Study Conclusions - Left ventricle: The cavity size was mildly dilated. Systolic   function was moderately reduced. The estimated ejection fraction   was in the range of 35% to 40%. There is akinesis of the   entireinferolateral, inferior, and inferoseptal myocardium.   Features are consistent with a pseudonormal left ventricular   filling pattern, with concomitant abnormal relaxation and   increased filling pressure (grade 2 diastolic dysfunction).   Doppler parameters are consistent with high ventricular filling   pressure. - Aortic valve: Trileaflet; mildly thickened, mildly calcified   leaflets. - Mitral valve: There was mild regurgitation. - Left atrium: The atrium was mildly dilated.  TEE /Loop Recorder:                                        02/27/2017     IMPRESSION: Justin. ZEPH [name] is a 67 y.o. male with PMH of HTN, HLD and Hx of CVA who presents with complaints of intermittent double vision. MRI reveals:  Patchy small volume acute ischemic nonhemorrhagic subcentimeter infarcts involving the left basal ganglia, left parieto-occipital region, and right cerebellar hemisphere  Suspected Etiology: Likely cardioembolic source Resultant Symptoms: intermittent double vision Stroke Risk Factors: diabetes mellitus, hyperlipidemia and hypertension Other Stroke Risk Factors: Advanced age, Chews tobacco, Hx stroke,  history of rheumatic fever and  History of traumatic ICH s/p  craniotomy  Outstanding Stroke Work-up Studies:     TEE /Loop Recorder:                                             PENDING 02/27/2017  PLAN  02/26/2017: Continue Aspirin/ Statin Frequent neuro checks Telemetry monitoring PT/OT/SLP Consult PM & Rehab Consult Case Management /MSW TEE and Loop Recorder Placement - 02/27/2017 at 3PM Ongoing aggressive stroke risk factor management Patient counseled to be compliant with his antithrombotic medications Patient counseled on Lifestyle modifications including, Diet, Exercise, and Stress Follow up with [facility] in 6 weeks  HX OF STROKES:  Small remote left thalamic lacunar infarct, CVA in 2010 with right sided weakness that subsequently resolved with physical therapy    Traumatic ICH s/p craniotomy in 1997,  INTRACRANIAL Atherosclerosis &Stenosis: On ASA for now  R/O AFIB:  TEE and Loop Recorder Placement at [facility] 02/27/2017 Cardiology Consultation for EF 35-40%  HYPERTENSION: Stable Permissive hypertension (OK if <220/120) for 24-48 hours post stroke and then gradually normalized within 5-7 days. Long term BP goal normotensive. May slowly restart home B/P medications after 48 hours Home Meds: Norvasc, Lisinopril  HYPERLIPIDEMIA:    Component Value Date/Time   CHOL 211 (H) 02/26/2017 0328   TRIG 153 (H) 02/26/2017 0328   HDL 41 02/26/2017 0328   CHOLHDL 5.1 02/26/2017 0328   VLDL 31 02/26/2017 0328   LDLCALC 139 (H) 02/26/2017 0328  Home Meds:  NONE LDL  goal < 70 Started on Lipitor to 40 mg daily Continue statin at discharge  DIABETES: Lab Results  Component Value Date   HGBA1C 6.5 (H) 02/26/2017  HgbA1c goal < 7.0 Continue CBG monitoring and SSI to maintain glucose 140-180 mg/dl DM education   TOBACCO ABUSE Currently chews tobacco Smoking cessation counseling provided Nicotine patch provided  Other Active Problems: Principal Problem:   CVA (cerebral vascular accident) ([place]) Active Problems:    Tobacco chew use   Essential hypertension   Diabetes type 2, controlled [facility])    Hospital day # 0 VTE prophylaxis: Lovenox  Diet : Fall precautions Diet heart healthy/carb modified Room service appropriate? Yes; Fluid consistency: Thin   FAMILY UPDATES:  family at bedside  TEAM UPDATES: Dessa Phi, DO   Prior Home Stroke Medications:  aspirin 81 mg daily  Discharge Stroke Meds:  Please discharge patient on aspirin 81 mg daily , for now  Disposition: 01-Home or Self Care Therapy Recs:               PENDING Follow Up:  Follow-up Information    Dennie Bible, NP. Schedule an appointment as soon as possible for a visit in 6 week(s).   Specialty:  Family Medicine Contact information: [address] [place] [address] [phone]          Tysinger, Leward Quan -PCP Follow up in 1-2 weeks      Assessment & plan discussed with with attending physician and they are in agreement.    Renie Ora Stroke Neurology Team 02/26/2017 2:09 PM   ATTENDING NOTE: I reviewed above note and agree with the assessment and plan. I have made any additions or clarifications directly to the above note. Pt was seen and examined.   67 year old male with history of HTN, stroke in 2010, traumatic ICH status post craniotomy in 1997, HLD admitted for double vision, difficulty walking, right lower quadrantanopia and intermittent weakness.  CT no acute abnormality.  However, MRI showed left BG, left MCA/PCA and right cerebellum patchy punctate infarcts.  Examination showed right lower quadrantanopia.  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  EF 35-40%.  LDL 139 and A1c 6.5.  Patient denies any cardiac issues in the past.  Denies palpitation.  Patient stated that he has been on aspirin 81 several years ago, he re-started aspirin 81 for the last several days.  He refused Plavix at this time.  We will  put him on aspirin 325 daily which he agrees.  We also need Lipitor for HLD.  Patient stroke etiology unclear, stroke pattern concerning for cardioembolic source.  Will do TEE and loop recorder.   Rosalin Hawking, MD PhD Stroke Neurology 02/26/2017 4:19 PM     To contact Stroke Continuity provider, please refer to [email or web address] After hours, contact  General Neurology

## 2017-02-27 ENCOUNTER — Encounter (HOSPITAL_COMMUNITY): Payer: Self-pay

## 2017-02-27 ENCOUNTER — Encounter (HOSPITAL_COMMUNITY)
Admission: EM | Disposition: A | Discharge status: Rehabilitation Facility | Payer: Self-pay | Source: Home / Self Care | Attending: Internal Medicine

## 2017-02-27 ENCOUNTER — Inpatient Hospital Stay (HOSPITAL_COMMUNITY): Payer: Medicare HMO

## 2017-02-27 ENCOUNTER — Inpatient Hospital Stay (HOSPITAL_COMMUNITY)
Admission: RE | Admit: 2017-02-27 | Discharge: 2017-03-06 | DRG: 057 | Disposition: A | Discharge status: Home / Self Care | Payer: Medicare HMO | Source: Intra-hospital | Attending: Physical Medicine & Rehabilitation | Admitting: Physical Medicine & Rehabilitation

## 2017-02-27 DIAGNOSIS — R269 Unspecified abnormalities of gait and mobility: Secondary | ICD-10-CM

## 2017-02-27 DIAGNOSIS — Z7982 Long term (current) use of aspirin: Secondary | ICD-10-CM

## 2017-02-27 DIAGNOSIS — I639 Cerebral infarction, unspecified: Secondary | ICD-10-CM | POA: Diagnosis present

## 2017-02-27 DIAGNOSIS — I69319 Unspecified symptoms and signs involving cognitive functions following cerebral infarction: Secondary | ICD-10-CM | POA: Diagnosis not present

## 2017-02-27 DIAGNOSIS — I10 Essential (primary) hypertension: Secondary | ICD-10-CM | POA: Diagnosis not present

## 2017-02-27 DIAGNOSIS — E119 Type 2 diabetes mellitus without complications: Secondary | ICD-10-CM | POA: Diagnosis not present

## 2017-02-27 DIAGNOSIS — E782 Mixed hyperlipidemia: Secondary | ICD-10-CM | POA: Diagnosis not present

## 2017-02-27 DIAGNOSIS — Z79899 Other long term (current) drug therapy: Secondary | ICD-10-CM

## 2017-02-27 DIAGNOSIS — I6340 Cerebral infarction due to embolism of unspecified cerebral artery: Principal | ICD-10-CM

## 2017-02-27 DIAGNOSIS — I6320 Cerebral infarction due to unspecified occlusion or stenosis of unspecified precerebral arteries: Secondary | ICD-10-CM | POA: Diagnosis not present

## 2017-02-27 DIAGNOSIS — I69393 Ataxia following cerebral infarction: Secondary | ICD-10-CM | POA: Diagnosis not present

## 2017-02-27 DIAGNOSIS — F17220 Nicotine dependence, chewing tobacco, uncomplicated: Secondary | ICD-10-CM | POA: Diagnosis not present

## 2017-02-27 DIAGNOSIS — Z87820 Personal history of traumatic brain injury: Secondary | ICD-10-CM | POA: Diagnosis not present

## 2017-02-27 DIAGNOSIS — I69398 Other sequelae of cerebral infarction: Secondary | ICD-10-CM | POA: Diagnosis not present

## 2017-02-27 DIAGNOSIS — Z9119 Patient's noncompliance with other medical treatment and regimen: Secondary | ICD-10-CM | POA: Diagnosis not present

## 2017-02-27 DIAGNOSIS — I6389 Other cerebral infarction: Secondary | ICD-10-CM

## 2017-02-27 DIAGNOSIS — E785 Hyperlipidemia, unspecified: Secondary | ICD-10-CM | POA: Diagnosis not present

## 2017-02-27 HISTORY — PX: LOOP RECORDER INSERTION: EP1214

## 2017-02-27 HISTORY — PX: TEE WITHOUT CARDIOVERSION: SHX5443

## 2017-02-27 LAB — GLUCOSE, CAPILLARY
GLUCOSE-CAPILLARY: 118 mg/dL — AB (ref 65–99)
GLUCOSE-CAPILLARY: 110 mg/dL — AB (ref 65–99)

## 2017-02-27 LAB — MRSA PCR SCREENING: MRSA by PCR: NEGATIVE

## 2017-02-27 SURGERY — ECHOCARDIOGRAM, TRANSESOPHAGEAL
Anesthesia: Moderate Sedation

## 2017-02-27 SURGERY — LOOP RECORDER INSERTION

## 2017-02-27 MED ORDER — FENTANYL CITRATE (PF) 100 MCG/2ML IJ SOLN
INTRAMUSCULAR | Status: DC | PRN
  Administered 2017-02-27: 25 ug via INTRAVENOUS

## 2017-02-27 MED ORDER — LIDOCAINE-EPINEPHRINE 1 %-1:100000 IJ SOLN
INTRAMUSCULAR | Status: DC | PRN
  Administered 2017-02-27: 30 mL

## 2017-02-27 MED ORDER — LIDOCAINE VISCOUS 2 % MT SOLN
OROMUCOSAL | Status: DC | PRN
  Administered 2017-02-27: 20 mL via OROMUCOSAL

## 2017-02-27 MED ORDER — ASPIRIN 325 MG PO TBEC: 325.0000 mg | DELAYED_RELEASE_TABLET | Freq: Every day | ORAL | 0 refills | Status: DC

## 2017-02-27 MED ORDER — SODIUM CHLORIDE 0.9 % IV SOLN
INTRAVENOUS | Status: DC
  Administered 2017-02-27: 15:00:00 via INTRAVENOUS

## 2017-02-27 MED ORDER — CARVEDILOL 3.125 MG PO TABS
3.1250 mg | ORAL_TABLET | Freq: Two times a day (BID) | ORAL | Status: DC
  Administered 2017-02-28 – 2017-03-06 (×13): 3.125 mg via ORAL
  Filled 2017-02-27 (×13): qty 1

## 2017-02-27 MED ORDER — FENTANYL CITRATE (PF) 100 MCG/2ML IJ SOLN
INTRAMUSCULAR | Status: AC
  Filled 2017-02-27: qty 2

## 2017-02-27 MED ORDER — ASPIRIN EC 325 MG PO TBEC
325.0000 mg | DELAYED_RELEASE_TABLET | Freq: Every day | ORAL | Status: DC
  Administered 2017-02-28 – 2017-03-06 (×7): 325 mg via ORAL
  Filled 2017-02-27 (×7): qty 1

## 2017-02-27 MED ORDER — ENOXAPARIN SODIUM 40 MG/0.4ML ~~LOC~~ SOLN: 40.0000 mg | SUBCUTANEOUS | Status: DC

## 2017-02-27 MED ORDER — SENNOSIDES-DOCUSATE SODIUM 8.6-50 MG PO TABS
1.0000 | ORAL_TABLET | Freq: Every evening | ORAL | Status: DC | PRN
  Filled 2017-02-27: qty 1

## 2017-02-27 MED ORDER — ACETAMINOPHEN 650 MG RE SUPP: 650.0000 mg | RECTAL | Status: DC | PRN

## 2017-02-27 MED ORDER — MIDAZOLAM HCL 5 MG/ML IJ SOLN
INTRAMUSCULAR | Status: AC
  Filled 2017-02-27: qty 2

## 2017-02-27 MED ORDER — ATORVASTATIN CALCIUM 40 MG PO TABS
40.0000 mg | ORAL_TABLET | Freq: Every day | ORAL | Status: DC
  Administered 2017-02-28 – 2017-03-05 (×6): 40 mg via ORAL
  Filled 2017-02-27 (×6): qty 1

## 2017-02-27 MED ORDER — SORBITOL 70 % SOLN: 30.0000 mL | Freq: Every day | Status: DC | PRN

## 2017-02-27 MED ORDER — LIDOCAINE VISCOUS 2 % MT SOLN
OROMUCOSAL | Status: AC
  Filled 2017-02-27: qty 15

## 2017-02-27 MED ORDER — INSULIN ASPART 100 UNIT/ML ~~LOC~~ SOLN
0.0000 [IU] | Freq: Three times a day (TID) | SUBCUTANEOUS | Status: DC
  Administered 2017-02-28 – 2017-03-05 (×4): 1 [IU] via SUBCUTANEOUS

## 2017-02-27 MED ORDER — ENOXAPARIN SODIUM 40 MG/0.4ML ~~LOC~~ SOLN
40.0000 mg | SUBCUTANEOUS | Status: DC
  Administered 2017-02-28 – 2017-03-04 (×5): 40 mg via SUBCUTANEOUS
  Filled 2017-02-27 (×6): qty 0.4

## 2017-02-27 MED ORDER — ACETAMINOPHEN 160 MG/5ML PO SOLN: 650.0000 mg | ORAL | Status: DC | PRN

## 2017-02-27 MED ORDER — CARVEDILOL 3.125 MG PO TABS: 3.1250 mg | ORAL_TABLET | Freq: Two times a day (BID) | ORAL | 0 refills | Status: DC

## 2017-02-27 MED ORDER — LIDOCAINE-EPINEPHRINE 1 %-1:100000 IJ SOLN
INTRAMUSCULAR | Status: AC
  Filled 2017-02-27: qty 1

## 2017-02-27 MED ORDER — ONDANSETRON HCL 4 MG/2ML IJ SOLN: 4.0000 mg | Freq: Four times a day (QID) | INTRAMUSCULAR | Status: DC | PRN

## 2017-02-27 MED ORDER — MIDAZOLAM HCL 5 MG/5ML IJ SOLN
INTRAMUSCULAR | Status: DC | PRN
  Administered 2017-02-27: 2 mg via INTRAVENOUS

## 2017-02-27 MED ORDER — ACETAMINOPHEN 325 MG PO TABS: 650.0000 mg | ORAL_TABLET | ORAL | Status: DC | PRN

## 2017-02-27 MED ORDER — LISINOPRIL 20 MG PO TABS
20.0000 mg | ORAL_TABLET | Freq: Every day | ORAL | Status: DC
  Administered 2017-02-28 – 2017-03-06 (×7): 20 mg via ORAL
  Filled 2017-02-27 (×7): qty 1

## 2017-02-27 MED ORDER — ATORVASTATIN CALCIUM 40 MG PO TABS: 40.0000 mg | ORAL_TABLET | Freq: Every day | ORAL | 0 refills | Status: DC

## 2017-02-27 MED ORDER — ONDANSETRON HCL 4 MG PO TABS
4.0000 mg | ORAL_TABLET | Freq: Four times a day (QID) | ORAL | Status: DC | PRN
  Filled 2017-02-27: qty 1

## 2017-02-27 SURGICAL SUPPLY — 2 items
LOOP REVEAL LINQSYS (Prosthesis & Implant Heart) ×1 IMPLANT
PACK LOOP INSERTION (CUSTOM PROCEDURE TRAY) ×2 IMPLANT

## 2017-02-27 NOTE — Progress Notes (Signed)
Physical Therapy Treatment Patient Details Name: Justin [name] MRN: [id number] DOB: 10-22-1950 Today's Date: 02/27/2017    History of Present Illness 67 y.o. 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  Patient reports having cataract surgery in August and November 2018.  He reports having blurry vision and/or double vision that lasts a few seconds and then self resolved. MRI showing small volume acute ischemic nonhemorrhagic subcentimeter infarcts involving the left basal ganglia, left parieto-occipital region, and right cerebellar hemisphere as above.    PT Comments    Pt is progressing towards his goals, and is very motivated to work towards them however he continues to be limited in safe mobility by visual deficits, ataxia and decreased safety awareness. Pt is currently mod I for bed mobility, minA for transfers and modA for ambulation of 200 feet with RW. D/c plans remain appropriate. PT will continue to follow acutely until d/c.   Follow Up Recommendations  CIR     Equipment Recommendations  Other (comment)(to be determined at next venue)    Recommendations for Other Services Rehab consult     Precautions / Restrictions Precautions Precautions: Fall;Other (comment)(Diplopia) Restrictions Weight Bearing Restrictions: No    Mobility  Bed Mobility Overal bed mobility: Modified Independent             General bed mobility comments: Increased time  Transfers Overall transfer level: Needs assistance Equipment used: None Transfers: Sit to/from Stand Sit to Stand: Min assist         General transfer comment: Min A to stabilize   Ambulation/Gait Ambulation/Gait assistance: Mod assist Ambulation Distance (Feet): 200 Feet Assistive device:  Rolling walker (2 wheeled) Gait Pattern/deviations: Step-through pattern;Staggering right;Staggering left;Ataxic Gait velocity: slowed Gait velocity interpretation: Below normal speed for age/gender General Gait Details: modA for stabilizing in RW, vc for wider BoS, proximity to walker, and not leaning over walker when he became emotional about his deficits     Modified Rankin (Stroke Patients Only) Modified Rankin (Stroke Patients Only) Pre-Morbid Rankin Score: No significant disability Modified Rankin: Moderately severe disability     Balance Overall balance assessment: Needs assistance Sitting-balance support: No upper extremity supported;Feet supported Sitting balance-Leahy Scale: Fair     Standing balance support: No upper extremity supported;During functional activity Standing balance-Leahy Scale: Poor Standing balance comment: Reliant on physical A to correct LOB                            Cognition Arousal/Alertness: Awake/alert Behavior During Therapy: (Upset about performance) Overall Cognitive Status: Impaired/Different from baseline Area of Impairment: Safety/judgement                         Safety/Judgement: Decreased awareness of safety     General Comments: Pt continues to be very emotionally labile, became tearful with walking and abruptly leaned over walker          General Comments General comments (skin integrity, edema, etc.): wife present during session. pt very tearful when he experiences deficits with ambulation      Pertinent Vitals/Pain Pain Assessment: No/denies pain    Home Living     Available Help at Discharge: Family;Available 24 hours/day Type of Home: House  PT Goals (current goals can now be found in the care plan section) Acute Rehab PT Goals Patient Stated Goal: Get back to PLOF PT Goal Formulation: With patient Time For Goal Achievement: 03/12/17 Potential to Achieve Goals:  Good Progress towards PT goals: Progressing toward goals    Frequency    Min 4X/week      PT Plan Current plan remains appropriate       AM-PAC PT "6 Clicks" Daily Activity  Outcome Measure  Difficulty turning over in bed (including adjusting bedclothes, sheets and blankets)?: A Little Difficulty moving from lying on back to sitting on the side of the bed? : A Little Difficulty sitting down on and standing up from a chair with arms (e.g., wheelchair, bedside commode, etc,.)?: Unable Help needed moving to and from a bed to chair (including a wheelchair)?: A Little Help needed walking in hospital room?: A Lot Help needed climbing 3-5 steps with a railing? : Total 6 Click Score: 13    End of Session Equipment Utilized During Treatment: Gait belt Activity Tolerance: Patient tolerated treatment well Patient left: with call bell/phone within reach;with family/visitor present;in bed;with bed alarm set Nurse Communication: Mobility status PT Visit Diagnosis: Unsteadiness on feet (R26.81);Other abnormalities of gait and mobility (R26.89);Muscle weakness (generalized) (M62.81);Difficulty in walking, not elsewhere classified (R26.2);Ataxic gait (R26.0);Other symptoms and signs involving the nervous system (Y48.250)     Time: 0370-4888 PT Time Calculation (min) (ACUTE ONLY): 17 min  Charges:  $Gait Training: 8-22 mins                    G Codes:       Kevaughn Ewing B. Migdalia Dk PT, DPT Acute Rehabilitation  [phone] Pager [phone]     [place] 02/27/2017, 1:48 PM

## 2017-02-27 NOTE — Progress Notes (Signed)
I have insurance approval to admit pt to inpt rehab today after TEE and LOOP. I met with pt and his girlfriend at bedside and they are in agreement. I have notified Dr. Maylene Roes and RN CM. I will make the arrangements. [phone]

## 2017-02-27 NOTE — Progress Notes (Signed)
PROGRESS NOTE    Justin [name]  YIR:[id number] DOB: 1950-02-18 DOA: 02/25/2017 PCP: Carlena Hurl, PA-C     Brief Narrative:  Justin [name] is a 67 y.o. male with medical history significant of HTN, HLD, DM type II diet controlled, traumatic ICH s/p craniotomy in 1997, and CVA without residual deficit in 2010; who presents with complaints of intermittent visual disturbances starting the week before Christmas of 2018. Patient reports having cataract surgery in August and November 2018.  He reports having blurry vision and/or double vision that lasts a few seconds and then self resolved. Associated symptoms included intermittent episodes of feeling off balance with possible weakness in his lower extremities.  He was seen by neurology at [facility] yesterday and recommended to have outpatient MRI and carotid Dopplers.  He has had at least 2-3 episodes of visual changes.  He went to his ophthalmologist Dr. Katy Fitch, and noted to have a new right lower quadrant visual field deficit for which he was sent to the emergency department for further evaluation.   Assessment & Plan:   Principal Problem:   CVA (cerebral vascular accident) ([place]) Active Problems:   Tobacco chew use   Essential hypertension   Diabetes type 2, controlled ([place])   Acute ischemic stroke ([place])   Dilated cardiomyopathy ([place])   Acute on chronic combined systolic and diastolic heart failure ([facility])   Benign essential HTN   Prediabetes   History of traumatic brain injury   History of CVA (cerebrovascular accident)   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[place])   Acute ischemic CVA -MRI reveals patchy acute ischemic subcentimeter infarcts involving the left basal ganglia, left parieto-occipital region and right cerebellar hemisphere. A small remote left thalamic lacunar infarct and sequelae of prior left parietal craniotomy were also noted. Based on the multiple vascular territories involved, cardioembolic phenomenon is suspected.  -CTA head  and neck showed atherosclerosis most heavily involving the posterior circulation. 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  -Stroke team following -TEE and loop recorder 1/31  -Aspirin, lipitor  -Follow up with [facility] in 6 weeks  New dx systolic CHF -Without volume overload, SOB, CP -EF 35% on echocardiogram -Cardiology consulted, plan for coronary angio once he is ready for discharge from inpatient rehab    Essential hypertension - Continue amlodipine and lisinopril  Diabetes mellitus type 2, controlled -Ha1c 6.5  -SSI   Hyperlipidemia -Lipitor started   Chew tobacco use -Counsel on the need of cessation of tobacco use    DVT prophylaxis: lovenox Code Status: full Family Communication: at bedside Disposition Plan: pending work up, [employer] when [employer] fast streamer complete    Consultants:   Neurology  Cardiology   Antimicrobials:  Anti-infectives (From admission, onward)   None       Subjective: No new issues, continues to have double vision   Objective: Vitals:   02/26/17 2147 02/27/17 0145 02/27/17 0600 02/27/17 0949  BP: 138/73 (!) 116/54 136/65 136/70  Pulse: (!) 57 63 67 (!) 52  Resp: 16 16 16 16   Temp: 98.7 F (37.1 C) 97.8 F (36.6 C) 97.9 F (36.6 C) 99 F (37.2 C)  TempSrc: Oral Oral Oral Oral  SpO2: 97% 98% 97% 99%  Weight:      Height:       No intake or output data in the 24 hours ending 02/27/17 1207 Filed Weights   02/25/17 1802 02/26/17 0022  Weight: 68 kg (150 lb) 69 kg (152  lb 1.9 oz)    Examination:  General exam: Appears calm and comfortable  Respiratory system: Clear to auscultation. Respiratory effort normal. Cardiovascular system: S1 & S2 heard, RRR. No JVD, murmurs, rubs, gallops or clicks. No pedal edema. Gastrointestinal system: Abdomen is nondistended, soft and nontender. No organomegaly or masses felt. Normal bowel sounds heard. Central nervous system: Alert and  oriented, no focal deficits  Extremities: Symmetric 5 x 5 power. Skin: No rashes, lesions or ulcers Psychiatry: Judgement and insight appear stable   Data Reviewed: I have personally reviewed following labs and imaging studies  CBC: Recent Labs  Lab 02/25/17 1607 02/26/17 0328  WBC 5.6 5.2  NEUTROABS 3.7  --   HGB 15.2 14.3  HCT 44.8 41.3  MCV 85.3 84.6  PLT 214 720   Basic Metabolic Panel: Recent Labs  Lab 02/25/17 1607 02/26/17 0328  NA 137 136  K 4.0 3.6  CL 101 104  CO2 25 24  GLUCOSE 96 157*  BUN 14 12  CREATININE 0.91 0.97  CALCIUM 9.4 8.7*   GFR: Estimated Creatinine Clearance: 66.7 mL/min (by C-G formula based on SCr of 0.97 mg/dL). Liver Function Tests: Recent Labs  Lab 02/25/17 1607  AST 19  ALT 16*  ALKPHOS 72  BILITOT 0.6  PROT 7.2  ALBUMIN 4.0   No results for input(s): LIPASE, AMYLASE in the last 168 hours. No results for input(s): AMMONIA in the last 168 hours. Coagulation Profile: Recent Labs  Lab 02/25/17 1607  INR 0.98   Cardiac Enzymes: Recent Labs  Lab 02/26/17 0328  CKTOTAL 56   BNP (last 3 results) No results for input(s): PROBNP in the last 8760 hours. HbA1C: Recent Labs    02/26/17 0328  HGBA1C 6.5*   CBG: Recent Labs  Lab 02/26/17 1635 02/26/17 2144 02/27/17 0641  GLUCAP 104* 146* 118*   Lipid Profile: Recent Labs    02/26/17 0328  CHOL 211*  HDL 41  LDLCALC 139*  TRIG 153*  CHOLHDL 5.1   Thyroid Function Tests: No results for input(s): TSH, T4TOTAL, FREET4, T3FREE, THYROIDAB in the last 72 hours. Anemia Panel: No results for input(s): VITAMINB12, FOLATE, FERRITIN, TIBC, IRON, RETICCTPCT in the last 72 hours. Sepsis Labs: No results for input(s): PROCALCITON, LATICACIDVEN in the last 168 hours.  Recent Results (from the past 240 hour(s))  Culture, blood (routine x 2)     Status: None (Preliminary result)   Collection Time: 02/26/17  3:30 AM  Result Value Ref Range Status   Specimen Description  BLOOD LEFT ARM  Final   Special Requests IN PEDIATRIC BOTTLE Blood Culture adequate volume  Final   Culture NO GROWTH 1 DAY  Final   Report Status PENDING  Incomplete  Culture, blood (routine x 2)     Status: None (Preliminary result)   Collection Time: 02/26/17  3:35 AM  Result Value Ref Range Status   Specimen Description BLOOD LEFT ANTECUBITAL  Final   Special Requests IN PEDIATRIC BOTTLE Blood Culture adequate volume  Final   Culture NO GROWTH 1 DAY  Final   Report Status PENDING  Incomplete  MRSA PCR Screening     Status: None   Collection Time: 02/26/17  9:25 PM  Result Value Ref Range Status   MRSA by PCR NEGATIVE NEGATIVE Final    Comment:        The GeneXpert MRSA Assay (FDA approved for NASAL specimens only), is one component of a comprehensive MRSA colonization surveillance program. It is not intended to  diagnose MRSA infection nor to guide or monitor treatment for MRSA infections.        Radiology Studies: Ct Angio Head W Or Wo Contrast  Result Date: 02/26/2017 CLINICAL DATA:  History of stroke. EXAM: CT ANGIOGRAPHY HEAD AND NECK TECHNIQUE: Multidetector CT imaging of the head and neck was performed using the standard protocol during bolus administration of intravenous contrast. Multiplanar CT image reconstructions and MIPs were obtained to evaluate the vascular anatomy. Carotid stenosis measurements (when applicable) are obtained utilizing NASCET criteria, using the distal internal carotid diameter as the denominator. CONTRAST:  50 cc Isovue 370 intravenous COMPARISON:  Brain MRI from yesterday FINDINGS: CTA NECK FINDINGS Aortic arch: Atheromatous wall thickening. Two vessel branching pattern. Right carotid system: Atheromatous wall thickening with small calcified plaque at the common carotid bifurcation. No flow limiting stenosis or ulceration. Negative for dissection or beading. Left carotid system: Mild atheromatous wall thickening. No stenosis or ulceration. Negative  for beading. Vertebral arteries: Proximal subclavian atherosclerosis without flow limiting stenosis. Right dominant vertebral artery. Both vertebral arteries are smooth and widely patent to the dura Skeleton: Remote left parieto-occipital craniotomy, reportedly for hematoma evacuation. Other neck: No significant incidental finding. Upper chest: Negative Review of the MIP images confirms the above findings CTA HEAD FINDINGS Anterior circulation: Calcified and noncalcified plaque along the carotid siphons. Approximately 50% atheromatous narrowing at the left supraclinoid segment. Hypoplastic right A1 segment with robust anterior communicating artery. Fetal type right PCA. No branch occlusion or flow limiting stenosis. Posterior circulation: Atherosclerotic plaque on both V4 segments. Stenosis or occlusion of the left V4 segment beyond the patent pica, which is the not Ake at its origin. There is also a high-grade narrowing of the right V4 segment with small outpouching likely reflecting atheromatous plaque irregularity. Basilar artery is small in the setting of fetal type right PCA, with mild atheromatous irregularity and narrowing. Mild to moderate narrowing at the basilar left PCA junction. Best seen on axial MIPS, the left PCA is less dense than the right. Atheromatous irregularity of left more than right PCA branches. Venous sinuses: Patent Anatomic variants: As above Delayed phase: No abnormal intracranial enhancement. Review of the MIP images confirms the above findings IMPRESSION: 1. Atherosclerosis most heavily involving the posterior circulation. 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 These stenoses lead to under opacification of left PCA vessels when compared to the right (which is fetal type). 2. Approximately 50% left supraclinoid ICA stenosis. 3. Atherosclerosis in the neck without flow limiting stenosis or ulceration. Electronically Signed   By: Monte Fantasia M.D.    On: 02/26/2017 13:55   Ct Head Wo Contrast  Result Date: 02/25/2017 CLINICAL DATA:  Blurred vision.  Unsteady gait. EXAM: CT HEAD WITHOUT CONTRAST TECHNIQUE: Contiguous axial images were obtained from the base of the skull through the vertex without intravenous contrast. COMPARISON:  None. FINDINGS: Brain: No acute intracranial abnormality. Specifically, no hemorrhage, hydrocephalus, mass lesion, acute infarction, or significant intracranial injury. Vascular: No hyperdense vessel or unexpected calcification. Skull: Prior craniotomy in the left posterior parietal region. No acute calvarial abnormality. Sinuses/Orbits: Visualized paranasal sinuses and mastoids clear. Orbital soft tissues unremarkable. Other: None IMPRESSION: No acute intracranial abnormality. Electronically Signed   By: Rolm Baptise M.D.   On: 02/25/2017 17:20   Ct Angio Neck W Or Wo Contrast  Result Date: 02/26/2017 CLINICAL DATA:  History of stroke. EXAM: CT ANGIOGRAPHY HEAD AND NECK TECHNIQUE: Multidetector CT imaging of the head and neck was  performed using the standard protocol during bolus administration of intravenous contrast. Multiplanar CT image reconstructions and MIPs were obtained to evaluate the vascular anatomy. Carotid stenosis measurements (when applicable) are obtained utilizing NASCET criteria, using the distal internal carotid diameter as the denominator. CONTRAST:  50 cc Isovue 370 intravenous COMPARISON:  Brain MRI from yesterday FINDINGS: CTA NECK FINDINGS Aortic arch: Atheromatous wall thickening. Two vessel branching pattern. Right carotid system: Atheromatous wall thickening with small calcified plaque at the common carotid bifurcation. No flow limiting stenosis or ulceration. Negative for dissection or beading. Left carotid system: Mild atheromatous wall thickening. No stenosis or ulceration. Negative for beading. Vertebral arteries: Proximal subclavian atherosclerosis without flow limiting stenosis. Right dominant  vertebral artery. Both vertebral arteries are smooth and widely patent to the dura Skeleton: Remote left parieto-occipital craniotomy, reportedly for hematoma evacuation. Other neck: No significant incidental finding. Upper chest: Negative Review of the MIP images confirms the above findings CTA HEAD FINDINGS Anterior circulation: Calcified and noncalcified plaque along the carotid siphons. Approximately 50% atheromatous narrowing at the left supraclinoid segment. Hypoplastic right A1 segment with robust anterior communicating artery. Fetal type right PCA. No branch occlusion or flow limiting stenosis. Posterior circulation: Atherosclerotic plaque on both V4 segments. Stenosis or occlusion of the left V4 segment beyond the patent pica, which is the not Ake at its origin. There is also a high-grade narrowing of the right V4 segment with small outpouching likely reflecting atheromatous plaque irregularity. Basilar artery is small in the setting of fetal type right PCA, with mild atheromatous irregularity and narrowing. Mild to moderate narrowing at the basilar left PCA junction. Best seen on axial MIPS, the left PCA is less dense than the right. Atheromatous irregularity of left more than right PCA branches. Venous sinuses: Patent Anatomic variants: As above Delayed phase: No abnormal intracranial enhancement. Review of the MIP images confirms the above findings IMPRESSION: 1. Atherosclerosis most heavily involving the posterior circulation. 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 These stenoses lead to under opacification of left PCA vessels when compared to the right (which is fetal type). 2. Approximately 50% left supraclinoid ICA stenosis. 3. Atherosclerosis in the neck without flow limiting stenosis or ulceration. Electronically Signed   By: Monte Fantasia M.D.   On: 02/26/2017 13:55   [name]  Result Date: 02/25/2017 CLINICAL DATA:  Initial  evaluation for intermittent blurry vision, double vision, unsteady gait. Evaluate for stroke. EXAM: MRI HEAD WITHOUT CONTRAST TECHNIQUE: Multiplanar, multiecho pulse sequences of the brain and surrounding structures were obtained without intravenous contrast. COMPARISON:  Prior CT from earlier the same day. FINDINGS: Brain: Age-appropriate cerebral atrophy. Minimal scattered T2/FLAIR hyperintensity within the periventricular and deep white matter both cerebral hemispheres, most like related chronic small vessel ischemic disease, felt to be within normal limits for age. Small remote lacunar infarct present within the left thalamus. 7 mm acute ischemic lacunar type infarct present within the inferior left basal ganglia (series 3, image 24). Mild scattered patchy subcentimeter cortical infarcts present within the left occipital lobe, left PCA distribution (series 3, image 31, 26). Few additional subcentimeter ischemic infarcts noted within the right cerebellar hemisphere (series 3, image 19, 15). No associated hemorrhage or mass effect. No other evidence for acute or subacute ischemia. Gray-white matter differentiation otherwise maintained. No mass lesion, midline shift or mass effect. No hydrocephalus. No extra-axial fluid collection. Major dural sinuses grossly patent. Pituitary gland suprasellar region normal. Midline structures intact and  normal. Vascular: Major intracranial vascular flow voids are maintained. Skull and upper cervical spine: Craniocervical junction normal. Upper cervical spine normal. Bone marrow signal intensity within normal limits. Postoperative changes from prior left parietal craniotomy noted. No acute scalp soft tissue abnormality. Sinuses/Orbits: Globes orbital soft tissues within normal limits. Patient status post lens extraction bilaterally. Paranasal sinuses are clear. Trace opacity noted within the bilateral mastoid air cells, of doubtful significance. Inner ear structures normal. Other:  None IMPRESSION: 1. Patchy small volume acute ischemic nonhemorrhagic subcentimeter infarcts involving the left basal ganglia, left parieto-occipital region, and right cerebellar hemisphere as above. 2. Small remote left thalamic lacunar infarct. 3. Sequelae of prior left parietal craniotomy. Electronically Signed   By: Jeannine Boga M.D.   On: 02/25/2017 20:59      Scheduled Meds: . aspirin EC  325 mg Oral Daily  . atorvastatin  40 mg Oral q1800  . carvedilol  3.125 mg Oral BID WC  . enoxaparin (LOVENOX) injection  40 mg Subcutaneous Q24H  . insulin aspart  0-9 Units Subcutaneous TID WC  . lisinopril  20 mg Oral Daily   Continuous Infusions:   LOS: 1 day    Time spent: 30 minutes   Dessa Phi, DO [facility] [email or web address] Password [facility] 02/27/2017, 12:07 PM

## 2017-02-27 NOTE — Progress Notes (Signed)
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 Per chart review, patient, and "Justin [name]", patient lives with spouse. One level home 4 steps to entry. Independent retired driving and active prior to admission. Presented 02/25/2017 with intermittent visual disturbances and decrease in balance that have persisted since Christmas 2018. Patient did have recent cataract surgery in August and November 2018. Noted blood pressure with systolic in the 742V. CT of the head reviewed, unremarkable for acute intracranial process. Patient did not receive TPA. MRI the brain showed patchy small-volume acute ischemic nonhemorrhagic subcentimeter infarct left basal ganglia left parietal-occipital region and right cerebellar hemisphere. Small remote left thalamic lacunar infarction. Neurology consulted and workup currently ongoing. CT angiogram head and neck are pending. Echocardiogram with ejection fraction of 95% grade 2 diastolic dysfunction. Currently maintained on aspirin for CVA prophylaxis. Subcutaneous Lovenox for DVT prophylaxis. Tolerating a regular consistency diet.  Review of Systems  Constitutional: Negative for chills and fever.  HENT: Positive for tinnitus.  Eyes: Positive for blurred vision.  Respiratory: Negative for cough and shortness of breath.  Cardiovascular: Negative for chest pain, palpitations and leg swelling.  Gastrointestinal: Negative for constipation, nausea and vomiting.  Genitourinary: Negative for dysuria, hematuria and urgency.  Musculoskeletal: Positive for myalgias.  Neurological: Positive for focal weakness.  All other systems reviewed and are negative.       Past Medical History:  Diagnosis Date  . Calcium oxalate  renal stones   . Chewing tobacco use   . Gallstones    hx/o, s/p choleycystectomy  . H/O echocardiogram 10/2008   no wall thickness, EF 55%, mild mitral regurge  . Hyperlipidemia   . Hypertension    hx/o, noncompliance prior with medication  . Influenza vaccine refused 09/2013  . Rheumatic fever    age 13  . Stroke ([place]) 10/02/2008  . Tinnitus         Past Surgical History:  Procedure Laterality Date  . blood clot  1997   brain  . BRAIN SURGERY  1997   craniotomy, evacuation of hematoma  . CHOLECYSTECTOMY  09/06/2011   Procedure: LAPAROSCOPIC CHOLECYSTECTOMY WITH INTRAOPERATIVE CHOLANGIOGRAM; Surgeon: Earnstine Regal, MD; Location: [facility]; Service: General; Laterality: N/A;  . COLONOSCOPY  01/04/14   normal. Dr. Owens Loffler  . Dental implant    . JOINT REPLACEMENT  2004   LEFT BICEP REPAIR, RTC repair  . ROTATOR CUFF REPAIR     Right  . SIGMOIDOSCOPY          Family History  Problem Relation Age of Onset  . Heart disease Mother 87   MI  . Migraines Mother   . Cirrhosis Brother   . Alcohol abuse Father    lived to age 53  . Diabetes Brother   . Colon cancer Neg Hx   . Cancer Neg Hx   . Stroke Neg Hx   . Hypertension Neg Hx   . Hyperlipidemia Neg Hx    Social History: reports that has never smoked. His smokeless tobacco use includes chew. He reports that he does not drink alcohol or use drugs.  Allergies: No Known Allergies        Medications Prior to Admission  Medication Sig Dispense Refill  . amLODipine (NORVASC) 5 MG tablet Take 5 mg  by mouth daily.    Marland Kitchen aspirin 81 MG chewable tablet Chew 81 mg by mouth daily.    Marland Kitchen lisinopril (PRINIVIL,ZESTRIL) 20 MG tablet Take 20 mg by mouth daily.    . Multiple Vitamins-Minerals (PRESERVISION AREDS 2) CAPS Take 2 capsules by mouth daily.     Home:  Home Living  Family/patient expects to be discharged to:: Private residence  Living Arrangements: Spouse/significant other  Available Help at Discharge: Family, Available 24  hours/day  Type of Home: House  Home Access: Stairs to enter  [employer] of Steps: 4  Entrance Stairs-Rails: Can reach both  Home Layout: One level  Bathroom Shower/Tub: Tourist information centre manager: Standard  Home Equipment: Careers adviser History:  Prior Function  Level of Independence: Independent  Comments: ADLs, IADLs, driving, retired, enjoys fishing regularly  Functional Status:  Mobility:  [place] bed mobility: [place] bed mobility comments: Increased time  Transfers  Overall transfer level: Needs assistance  Equipment used: None  Transfers: Sit to/from Stand  Sit to Stand: Min assist  General transfer comment: Min A to stabilize  Ambulation/Gait  Ambulation/Gait assistance: Mod assist  Ambulation Distance (Feet): 18 Feet  Assistive device: 1 person hand held assist  Gait Pattern/deviations: Step-through pattern, Staggering right, Staggering left, Ataxic  General Gait Details: modA for stability with HHA, c/o of visual disturbance contributing to instability, attempted to close L eye to improve stability, minor improvement. however still requires modA  Gait velocity: slowed  Gait velocity interpretation: Below normal speed for age/gender   ADL:  ADL  Overall ADL's : Needs assistance/impaired  Eating/Feeding: NPO  Grooming: Oral care, Wash/dry face, Min guard, Minimal assistance, Standing  Grooming Details (indicate cue type and reason): Min Guard-Min A for standing balance. With static standing, pt able to maintain balance, but as soon as he adjusts his postition, he has LOB and requires physical A to recover. Pt demonstrating decreased depth perception when reaching for targets.  Upper Body Bathing: Set up, Sitting  Lower Body Bathing: Maximal assistance, Sit to/from stand  Upper Body Dressing : Set up, Sitting  Lower Body Dressing: Maximal assistance, Sit to/from stand  Lower Body Dressing Details  (indicate cue type and reason): Max A for standing balance. Pt donning bedroom slippers at bed level and demosntrating WFL ROM  Toilet Transfer: Minimal assistance, Ambulation, Maximal assistance  Toilet Transfer Details (indicate cue type and reason): Max A for LOB and to correct. Min A overall  Functional mobility during ADLs: Minimal assistance, Maximal assistance  General ADL Comments: Pt demosntrating decreased fucnitonal performance and is very upset about change. Pt requiring Min A for stability in standing and Max A to recover from LOB. Pt presenting with diplopia.  Cognition:  Cognition  Overall Cognitive Status: (WFL for tasks. Will further assess)  Orientation Level: Oriented X4  Cognition  Arousal/Alertness: Awake/alert  Behavior During Therapy: (Upset about performance)  Overall Cognitive Status: (WFL for tasks. Will further assess)  General Comments: Pt following commands and answering questions without difficulty. With episodes of crying and overwhelmed by situation  Blood pressure (!) 146/78, pulse 66, temperature 97.8 F (36.6 C), temperature source Oral, resp. rate 16, height 5\' 6"  (1.676 m), weight 69 kg (152 lb 1.9 oz), SpO2 100 %.  Physical Exam  Vitals reviewed.  Constitutional: He is oriented to person, place, and time. He appears well-developed and well-nourished.  67 year old right-handed male sitting up edge of bed  HENT:  Head: Normocephalic  and atraumatic.  Eyes: Right eye exhibits no discharge. Left eye exhibits no discharge.  Pupils round and reactive to light  Neck: Normal range of motion. Neck supple. No thyromegaly present.  Cardiovascular: Normal rate, regular rhythm and normal heart sounds.  Respiratory: Effort normal and breath sounds normal. No respiratory distress.  GI: Soft. Bowel sounds are normal. He exhibits no distension.  Musculoskeletal: He exhibits no edema or tenderness.  No edema or tenderness in extremities  Neurological: He is alert and  oriented to person, place, and time.  Followed full commands Motor: RUE/RLE: 5/5 proximal to distal LUE/LLE: 4+/5 proximal to distal No Ataxia b/l UE Sensation intact to light touch  Skin: Skin is warm and dry.  Psychiatric:  Atypical affect   Lab Results Last 24 Hours  Imaging Results (Last 48 hours)     Assessment/Plan:  Diagnosis: Left basal ganglia left parietal-occipital region and right cerebellar hemisphere infarcts  Labs and images independently reviewed. Records reviewed and summated above.  Stroke:  Continue secondary stroke prophylaxis and Risk Factor Modification listed below:  Antiplatelet therapy:  Blood Pressure Management: Continue current medication with prn's with permisive HTN per primary team  Statin Agent:  Prediabetes management:  1. Does the need for close, 24 hr/day medical supervision in concert with the patient's rehab needs make it unreasonable for this patient to be served in a less intensive setting? Yes  2. Co-Morbidities requiring supervision/potential complications: combined CHF (Monitor in accordance with increased physical activity and avoid UE resistance excercises), HTN (monitor and provide prns in accordance with increased physical exertion and pain), traumatic ICH with craniotomy (?baseline  cognitive deficits), CVA without residual deficits, prediabetes (Monitor in accordance with exercise and adjust meds as necessary) 3. Due to safety, disease management and patient education, does the patient require 24 hr/day rehab nursing? Yes 4. Does the patient require coordinated care of a physician, rehab nurse, PT (1-2 hrs/day, 5 days/week), OT (1-2 hrs/day, 5 days/week) and SLP (1-2 hrs/day, 5 days/week) to address physical and functional deficits in the context of the above medical diagnosis(es)? Yes Addressing deficits in the following areas: balance, endurance, locomotion, strength, transferring, bathing, dressing, toileting, cognition and psychosocial support 5. Can the patient actively participate in an intensive therapy program of at least 3 hrs of therapy per day at least 5 days per week? Yes 6. The potential for patient to make measurable gains while on inpatient rehab is excellent 7. Anticipated functional outcomes upon discharge from inpatient rehab are supervision with PT, supervision with OT, supervision with SLP. 8. Estimated rehab length of stay to reach the above functional goals is: 9-13 days. 9. Anticipated D/C setting: Home 10. Anticipated post D/C treatments: HH therapy and Home excercise program 11. Overall Rehab/Functional Prognosis: good RECOMMENDATIONS:  This patient's condition is appropriate for continued rehabilitative care in the following setting: CIR after completion of medical workup  Patient has agreed to participate in recommended program. Yes  Note that insurance prior authorization may be required for reimbursement for recommended care.  Comment: Rehab Admissions Coordinator to follow up.  Delice Lesch, MD, ABPMR  Lavon Paganini Angiulli, PA-C  02/26/2017

## 2017-02-27 NOTE — Progress Notes (Signed)
I met with pt at bedside to discuss goals and expectations of a possible inpt rehab admission. He is in agreement . I have begun insurance authorization with [facility]. I explained to pt that admission is pending their approval. [phone]

## 2017-02-27 NOTE — IPOC Note (Signed)
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 ([place])   Left basal ganglia embolic stroke ([place])   Diabetes mellitus type 2 in nonobese [facility])     Functional Problem List: Nursing Endurance, Motor, Safety, Perception, Other (comment)  PT Balance, Behavior, Endurance, Motor, Perception, Safety  OT Balance, Endurance, Motor, Vision  SLP Cognition  TR         Basic ADL's: OT Grooming, Dressing, Toileting     Advanced  ADL's: OT Simple Meal Preparation     Transfers: PT Bed Mobility, Car, Bed to Chair, [employer], Editor, commissioning, Agricultural engineer: PT Stairs, Ambulation     Additional Impairments: OT Fuctional Use of Upper Extremity  SLP Social Cognition   Memory  TR      Anticipated Outcomes Item Anticipated Outcome  Self Feeding modified independent  Swallowing      Basic self-care  modified independent  Toileting  modified independent   Bathroom Transfers modified independent  Bowel/Bladder  Pt will continue to remain continent of bowel and bladder, pt will continue to call and use appropriate toileting devices   Transfers  Mod I   Locomotion  Supervision gait   Communication     Cognition  mod I   Pain  Pt wil continue to have effective pain  control (pain rating less than 3) utilizing currently  presribed pain regimine  Safety/Judgment  pt will remain free of falls, utilize call light appropriately to obtain assistance to the restroom or attend to other need. Family will demonstrate understanding of safety to be able to independantly assist pt in room through therapy check off.    Therapy Plan: PT Intensity: Minimum of  1-2 x/day ,45 to 90 minutes PT Frequency: 5 out of 7 days PT Duration Estimated Length of Stay: 7-10 days  OT Intensity: Minimum of 1-2 x/day, 45 to 90 minutes OT Frequency: 5 out of 7 days OT Duration/Estimated Length of Stay: 7-9 days SLP Intensity: Minumum of 1-2 x/day, 30 to 90 minutes SLP Frequency: 3 to 5 out of 7 days SLP Duration/Estimated Length of Stay: 7-10 days     Team Interventions: Nursing Interventions Patient/Family Education, Disease Management/Prevention, Psychosocial Support, Medication Management, Dysphagia/Aspiration Precaution Training, Discharge Planning, Bladder Management, Pain Management, Skin Care/Wound Management, Cognitive Remediation/Compensation, Bowel Management  PT interventions Ambulation/gait training, Disease management/prevention, Pain management, Stair training, Wheelchair propulsion/positioning, Therapeutic Activities, Visual/perceptual remediation/compensation, DME/adaptive equipment instruction, Patient/family education, Training and development officer, Therapeutic Exercise, Psychosocial support, Functional electrical stimulation, Cognitive remediation/compensation, Community reintegration, Functional mobility training, Skin care/wound management, UE/LE Strength taining/ROM, Splinting/orthotics, UE/LE Coordination activities, Discharge planning, Neuromuscular re-education  OT Interventions Balance/vestibular training, DME/adaptive equipment instruction, Discharge planning, Pain management, Patient/family education, Neuromuscular re-education, Self Care/advanced ADL retraining, Therapeutic Activities, UE/LE Coordination activities, Therapeutic Exercise, Visual/perceptual remediation/compensation, UE/LE Strength taining/ROM, Splinting/orthotics, Functional mobility training, Community reintegration  SLP Interventions Cognitive remediation/compensation, English as a second language teacher, Patient/family education, Internal/external aids, Environmental controls  TR Interventions     SW/CM Interventions Discharge Planning, Psychosocial Support, Patient/Family Education   Barriers to Discharge MD  Medical stability  Nursing Medical stability, Medication compliance, Behavior    PT Inaccessible home environment, Behavior mildly restless and impulsive  OT      SLP  SW       Team Discharge Planning: Destination: PT-Home ,OT- Home , SLP-Home Projected Follow-up: PT-Outpatient PT(pt girlfriend, Mortimer Fries, can drive him ), OT-  Outpatient OT, SLP-Other (comment)(TBD) Projected Equipment Needs: PT-To be determined, OT- Tub/shower seat, SLP-None recommended by SLP Equipment Details: PT- , OT-  Patient/family involved in discharge planning: PT- Patient, Family member/caregiver,  OT-Patient, Family member/caregiver, SLP-Patient  MD ELOS: 5-9 days. 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 Presented 02/25/2017 with intermittent visual disturbances and decrease in balance that have persisted since Christmas 2018. Patient did have recent cataract surgery in August and November 2018. Noted blood pressure with systolic in the 415A. CT of the headreviewed, unremarkable for acute intracranial process.Patient did not receive TPA. MRI the brain showed patchy small-volume acute ischemic nonhemorrhagic subcentimeter infarct left basal ganglia left parietal-occipital region and right cerebellar hemisphere. Small remote left thalamic lacunar infarction. Neurology consulted and workup currently ongoing.CT angiogram head andneck showed atherosclerosis most heavily involving the posterior circulation.approximately 50% left supraclinoid ICA stenosisEchocardiogram with ejection fraction of 30% grade 2 diastolic dysfunction.TEEand loop recorder placement 01/31/2019Currently maintained on aspirin for  CVA prophylaxis. Patient with resulting functional deficits with mobility, safety, cognition.  Will set goals for Mod I with PT/OT/SLP.   See Team Conference Notes for weekly updates to the plan of care

## 2017-02-27 NOTE — H&P (Signed)
Physical Medicine and Rehabilitation Admission H&P        Chief Complaint  Patient presents with  . 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 Per chart review, patient, and "[name]", patient lives with spouse. One level home 4 steps to entry. Independent retired driving and active prior to admission. Presented 02/25/2017 with intermittent visual disturbances and decrease in balance that have persisted since Christmas 2018. Patient did have recent cataract surgery in August and November 2018. Noted blood pressure with systolic in the 833A. CT of the head reviewed, unremarkable for acute intracranial process. Patient did not receive TPA. MRI the brain showed patchy small-volume acute ischemic nonhemorrhagic subcentimeter infarct left basal ganglia left parietal-occipital region and right cerebellar hemisphere. Small remote left thalamic lacunar infarction. Neurology consulted and workup currently ongoing. CT angiogram head and neck showed atherosclerosis most heavily involving the posterior circulation.approximately 50% left supraclinoid ICA stenosis Echocardiogram with ejection fraction of 25% grade 2 diastolic dysfunction.TEE with results pending and loop recorder placement 02/27/2017 Currently maintained on aspirin for CVA prophylaxis. Subcutaneous Lovenox for DVT prophylaxis. Tolerating a regular consistency diet.Physical and occupational therapy evaluations completed and ongoing with recommendations of physical medicine rehabilitation consult. Patient was admitted for a comprehensive rehabilitation program       Review of Systems  Constitutional: Negative for chills and fever.  HENT: Positive for tinnitus.   Eyes: Positive for blurred vision.  Respiratory: Negative for cough and shortness of  breath.   Cardiovascular: Positive for leg swelling. Negative for chest pain and palpitations.  Gastrointestinal: Positive for constipation. Negative for nausea and vomiting.  Genitourinary: Negative for dysuria, flank pain and hematuria.  Musculoskeletal: Positive for myalgias.  Skin: Negative for rash.  Neurological: Positive for focal weakness and headaches. Negative for seizures.  All other systems reviewed and are negative.       Past Medical History:  Diagnosis Date  . Calcium oxalate renal stones    . Chewing tobacco use    . Gallstones      hx/o, s/p choleycystectomy  . H/O echocardiogram 10/2008    no wall thickness, EF 55%, mild mitral regurge  . Hyperlipidemia    . Hypertension      hx/o, noncompliance prior with medication  . Influenza vaccine refused 09/2013  . Rheumatic fever      age 13  . Stroke ([place]) 10/02/2008  . Tinnitus           Past Surgical History:  Procedure Laterality Date  . blood clot   1997    brain  . BRAIN SURGERY   1997    craniotomy, evacuation of hematoma  . CHOLECYSTECTOMY   09/06/2011    Procedure: LAPAROSCOPIC CHOLECYSTECTOMY WITH INTRAOPERATIVE CHOLANGIOGRAM;  Surgeon: Earnstine Regal, MD;  Location: [facility];  Service: General;  Laterality: N/A;  . COLONOSCOPY   01/04/14    normal. Dr. Owens Loffler  . Dental implant      . JOINT REPLACEMENT   2004    LEFT BICEP REPAIR, RTC repair  . ROTATOR CUFF REPAIR        Right  . SIGMOIDOSCOPY             Family History  Problem Relation Age of Onset  . Heart disease Mother 49  MI  . Migraines Mother    . Cirrhosis Brother    . Alcohol abuse Father          lived to age 71  . Diabetes Brother    . Colon cancer Neg Hx    . Cancer Neg Hx    . Stroke Neg Hx    . Hypertension Neg Hx    . Hyperlipidemia Neg Hx      Social History:  reports that  has never smoked. His smokeless tobacco use includes chew. He reports that he does not drink alcohol or use drugs. Allergies: No Known  Allergies       Medications Prior to Admission  Medication Sig Dispense Refill  . amLODipine (NORVASC) 5 MG tablet Take 5 mg by mouth daily.      Marland Kitchen aspirin 81 MG chewable tablet Chew 81 mg by mouth daily.      Marland Kitchen lisinopril (PRINIVIL,ZESTRIL) 20 MG tablet Take 20 mg by mouth daily.      . Multiple Vitamins-Minerals (PRESERVISION AREDS 2) CAPS Take 2 capsules by mouth daily.          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, contracting  Lives With: Other (Comment), Alone(lives alone but stays with his girlfriend often)   Functional History: Prior Function Level of Independence: Independent Comments: ADLs, IADLs, driving, retired, enjoys fishing regularly   Functional Status:  Mobility: Bed Mobility Overal bed mobility: Sullivan bed mobility comments: Increased time Transfers Overall transfer level: Needs assistance Equipment used: None Transfers: Sit to/from Stand Sit to Stand: Min assist General transfer comment: Min A to stabilize  Ambulation/Gait Ambulation/Gait assistance: Mod assist Ambulation Distance (Feet): 18 Feet Assistive device: 1 person hand held assist Gait Pattern/deviations: Step-through pattern, Staggering right, Staggering left, Ataxic General Gait Details: modA for stability with HHA, c/o of visual disturbance contributing to instability, attempted to close L eye to improve stability, minor improvement. however still requires modA Gait velocity: slowed Gait  velocity interpretation: Below normal speed for age/gender   ADL: ADL Overall ADL's : Needs assistance/impaired Eating/Feeding: NPO Grooming: Oral care, Wash/dry face, Min guard, Minimal assistance, Standing Grooming Details (indicate cue type and reason): Min Guard-Min A for standing balance. With static standing, pt able to maintain balance, but as soon as he adjusts his postition, he has LOB and requires physical A to recover. Pt demonstrating decreased depth perception when reaching for targets. Upper Body Bathing: Set up, Sitting Lower Body Bathing: Maximal assistance, Sit to/from stand Upper Body Dressing : Set up, Sitting Lower Body Dressing: Maximal assistance, Sit to/from stand Lower Body Dressing Details (indicate cue type and reason): Max A for standing balance. Pt donning bedroom slippers at bed level and demosntrating WFL ROM Toilet Transfer: Minimal assistance, Ambulation, Maximal assistance Toilet Transfer Details (indicate cue type and reason): Max A for LOB and to correct. Min A overall Functional mobility during ADLs: Minimal assistance, Maximal assistance General ADL Comments: Pt demosntrating decreased fucnitonal performance and is very upset about change. Pt requiring Min A for stability in standing and Max A to recover from LOB. Pt presenting with  diplopia.   Cognition: Cognition Overall Cognitive Status: Within Functional Limits for tasks assessed Arousal/Alertness: Awake/alert Orientation Level: Oriented X4 Attention: Sustained(decreased sustained attention due to internal issues/concerns/frustration with current event) Sustained Attention: Appears intact Memory: Impaired Memory Impairment: Retrieval deficit(pt recalled 3/5 words independently, 1/5 with cue, did not identify 1 with multiple choice) Awareness: Appears intact Problem Solving: Appears intact(problem solving to figure out today's date) Behaviors: Restless, Other (comment)(pt became tearful at end of  session) Cognition Arousal/Alertness: Awake/alert Behavior During Therapy: (Upset about performance) Overall Cognitive Status: Within Functional Limits for tasks assessed General Comments: Pt following commands and answering questions without difficulty. With episodes of crying and overwhelmed by situation   Physical Exam: Blood pressure 136/70, pulse (!) 52, temperature 99 F (37.2 C), temperature source Oral, resp. rate 16, height _0  (1.676 m), weight 69 kg (152 lb 1.9 oz), SpO2 99 %. Physical Exam  Vitals reviewed. HENT:  Head: Normocephalic.  Eyes: EOM are normal. Right eye exhibits no discharge. Left eye exhibits no discharge.  Neck: Normal range of motion. Neck supple. No thyromegaly present.  Cardiovascular: Normal rate, regular rhythm and normal heart sounds.  Respiratory: Effort normal and breath sounds normal. No respiratory distress.  GI: Soft. Bowel sounds are normal. He exhibits no distension.  Skin. Warm and dry Musculoskeletal: He exhibits no edema or tenderness.  No edema or tenderness in extremities  Neurological: He is alert and oriented to person, place, and time.  Followed full commands Motor: RUE/RLE: 5/5 proximal to distal LUE/LLE: 4+/5 proximal to distal No Ataxia b/l UE Sensation intact to light touch   No Dysarthria or aphasia No field cut or neglect     Lab Results Last 48 Hours        Results for orders placed or performed during the hospital encounter of 02/25/17 (from the past 48 hour(s))  Protime-INR     Status: None    Collection Time: 02/25/17  4:07 PM  Result Value Ref Range    Prothrombin Time 12.9 11.4 - 15.2 seconds    INR 0.98    APTT     Status: None    Collection Time: 02/25/17  4:07 PM  Result Value Ref Range    aPTT 32 24 - 36 seconds  CBC     Status: None    Collection Time: 02/25/17  4:07 PM  Result Value Ref Range    WBC 5.6 4.0 - 10.5 K/uL    RBC 5.25 4.22 - 5.81 MIL/uL    Hemoglobin 15.2 13.0 - 17.0 g/dL    HCT 44.8  39.0 - 52.0 %    MCV 85.3 78.0 - 100.0 fL    MCH 29.0 26.0 - 34.0 pg    MCHC 33.9 30.0 - 36.0 g/dL    RDW 14.0 11.5 - 15.5 %    Platelets 214 150 - 400 K/uL  Differential     Status: None    Collection Time: 02/25/17  4:07 PM  Result Value Ref Range    Neutrophils Relative % 66 %    Neutro Abs 3.7 1.7 - 7.7 K/uL    Lymphocytes Relative 27 %    Lymphs Abs 1.5 0.7 - 4.0 K/uL    Monocytes Relative 6 %    Monocytes Absolute 0.4 0.1 - 1.0 K/uL    Eosinophils Relative 1 %    Eosinophils Absolute 0.1 0.0 - 0.7 K/uL    Basophils Relative 0 %    Basophils Absolute 0.0 0.0 - 0.1 K/uL  Comprehensive metabolic panel     Status: Abnormal    Collection Time: 02/25/17  4:07 PM  Result Value Ref Range    Sodium 137 135 - 145 mmol/L    Potassium 4.0 3.5 - 5.1 mmol/L      Comment: SLIGHT HEMOLYSIS    Chloride 101 101 - 111 mmol/L    CO2 25 22 - 32 mmol/L    Glucose, Bld 96 65 - 99 mg/dL    BUN 14 6 - 20 mg/dL    Creatinine, Ser 0.91 0.61 - 1.24 mg/dL    Calcium 9.4 8.9 - 10.3 mg/dL    Total Protein 7.2 6.5 - 8.1 g/dL    Albumin 4.0 3.5 - 5.0 g/dL    AST 19 15 - 41 U/L    ALT 16 (L) 17 - 63 U/L    Alkaline Phosphatase 72 38 - 126 U/L    Total Bilirubin 0.6 0.3 - 1.2 mg/dL    GFR calc non Af Amer >60 >60 mL/min    GFR calc Af Amer >60 >60 mL/min      Comment: (NOTE) The eGFR has been calculated using the CKD EPI equation. This calculation has not been validated in all clinical situations. eGFR's persistently <60 mL/min signify possible Chronic Kidney Disease.      Anion gap 11 5 - 15  I-stat troponin, ED     Status: None    Collection Time: 02/25/17  4:28 PM  Result Value Ref Range    Troponin i, poc 0.01 0.00 - 0.08 ng/mL    Comment 3               Comment: Due to the release kinetics of cTnI, a negative result within the first hours of the onset of symptoms does not rule out myocardial infarction with certainty. If myocardial infarction is still suspected, repeat the test at  appropriate intervals.    Hemoglobin A1c     Status: Abnormal    Collection Time: 02/26/17  3:28 AM  Result Value Ref Range    Hgb A1c MFr Bld 6.5 (H) 4.8 - 5.6 %      Comment: (NOTE) Pre diabetes:          5.7%-6.4% Diabetes:              >6.4% Glycemic control for   <7.0% adults with diabetes      Mean Plasma Glucose 139.85 mg/dL  Lipid panel     Status: Abnormal    Collection Time: 02/26/17  3:28 AM  Result Value Ref Range    Cholesterol 211 (H) 0 - 200 mg/dL    Triglycerides 153 (H) <150 mg/dL    HDL 41 >40 mg/dL    Total CHOL/HDL Ratio 5.1 RATIO    VLDL 31 0 - 40 mg/dL    LDL Cholesterol 139 (H) 0 - 99 mg/dL      Comment:        Total Cholesterol/HDL:CHD Risk Coronary Heart Disease Risk Table                     Men   Women  1/2 Average Risk   3.4   3.3  Average Risk       5.0   4.4  2 X Average Risk   9.6   7.1  3 X Average Risk  23.4   11.0        Use the calculated Patient Ratio above and the CHD Risk Table  to determine the patient's CHD Risk.        ATP III CLASSIFICATION (LDL):  <100     mg/dL   Optimal  100-129  mg/dL   Near or Above                    Optimal  130-159  mg/dL   Borderline  160-189  mg/dL   High  >190     mg/dL   Very High    CBC     Status: None    Collection Time: 02/26/17  3:28 AM  Result Value Ref Range    WBC 5.2 4.0 - 10.5 K/uL    RBC 4.88 4.22 - 5.81 MIL/uL    Hemoglobin 14.3 13.0 - 17.0 g/dL    HCT 41.3 39.0 - 52.0 %    MCV 84.6 78.0 - 100.0 fL    MCH 29.3 26.0 - 34.0 pg    MCHC 34.6 30.0 - 36.0 g/dL    RDW 13.7 11.5 - 15.5 %    Platelets 183 150 - 400 K/uL  Basic metabolic panel     Status: Abnormal    Collection Time: 02/26/17  3:28 AM  Result Value Ref Range    Sodium 136 135 - 145 mmol/L    Potassium 3.6 3.5 - 5.1 mmol/L    Chloride 104 101 - 111 mmol/L    CO2 24 22 - 32 mmol/L    Glucose, Bld 157 (H) 65 - 99 mg/dL    BUN 12 6 - 20 mg/dL    Creatinine, Ser 0.97 0.61 - 1.24 mg/dL    Calcium 8.7 (L) 8.9 - 10.3 mg/dL     GFR calc non Af Amer >60 >60 mL/min    GFR calc Af Amer >60 >60 mL/min      Comment: (NOTE) The eGFR has been calculated using the CKD EPI equation. This calculation has not been validated in all clinical situations. eGFR's persistently <60 mL/min signify possible Chronic Kidney Disease.      Anion gap 8 5 - 15  CK     Status: None    Collection Time: 02/26/17  3:28 AM  Result Value Ref Range    Total CK 56 49 - 397 U/L  Glucose, capillary     Status: Abnormal    Collection Time: 02/26/17  4:35 PM  Result Value Ref Range    Glucose-Capillary 104 (H) 65 - 99 mg/dL    Comment 1 Notify RN      Comment 2 Document in Chart    MRSA PCR Screening     Status: None    Collection Time: 02/26/17  9:25 PM  Result Value Ref Range    MRSA by PCR NEGATIVE NEGATIVE      Comment:        The GeneXpert MRSA Assay (FDA approved for NASAL specimens only), is one component of a comprehensive MRSA colonization surveillance program. It is not intended to diagnose MRSA infection nor to guide or monitor treatment for MRSA infections.    Glucose, capillary     Status: Abnormal    Collection Time: 02/26/17  9:44 PM  Result Value Ref Range    Glucose-Capillary 146 (H) 65 - 99 mg/dL    Comment 1 Notify RN      Comment 2 Document in Chart    Glucose, capillary     Status: Abnormal    Collection Time: 02/27/17  6:41 AM  Result Value Ref Range  Glucose-Capillary 118 (H) 65 - 99 mg/dL    Comment 1 Notify RN      Comment 2 Document in Chart         Imaging Results (Last 48 hours)  Ct Angio Head W Or Wo Contrast   Result Date: 02/26/2017 CLINICAL DATA:  History of stroke. EXAM: CT ANGIOGRAPHY HEAD AND NECK TECHNIQUE: Multidetector CT imaging of the head and neck was performed using the standard protocol during bolus administration of intravenous contrast. Multiplanar CT image reconstructions and MIPs were obtained to evaluate the vascular anatomy. Carotid stenosis measurements (when applicable)  are obtained utilizing NASCET criteria, using the distal internal carotid diameter as the denominator. CONTRAST:  50 cc Isovue 370 intravenous COMPARISON:  Brain MRI from yesterday FINDINGS: CTA NECK FINDINGS Aortic arch: Atheromatous wall thickening. Two vessel branching pattern. Right carotid system: Atheromatous wall thickening with small calcified plaque at the common carotid bifurcation. No flow limiting stenosis or ulceration. Negative for dissection or beading. Left carotid system: Mild atheromatous wall thickening. No stenosis or ulceration. Negative for beading. Vertebral arteries: Proximal subclavian atherosclerosis without flow limiting stenosis. Right dominant vertebral artery. Both vertebral arteries are smooth and widely patent to the dura Skeleton: Remote left parieto-occipital craniotomy, reportedly for hematoma evacuation. Other neck: No significant incidental finding. Upper chest: Negative Review of the MIP images confirms the above findings CTA HEAD FINDINGS Anterior circulation: Calcified and noncalcified plaque along the carotid siphons. Approximately 50% atheromatous narrowing at the left supraclinoid segment. Hypoplastic right A1 segment with robust anterior communicating artery. Fetal type right PCA. No branch occlusion or flow limiting stenosis. Posterior circulation: Atherosclerotic plaque on both V4 segments. Stenosis or occlusion of the left V4 segment beyond the patent pica, which is the not Ake at its origin. There is also a high-grade narrowing of the right V4 segment with small outpouching likely reflecting atheromatous plaque irregularity. Basilar artery is small in the setting of fetal type right PCA, with mild atheromatous irregularity and narrowing. Mild to moderate narrowing at the basilar left PCA junction. Best seen on axial MIPS, the left PCA is less dense than the right. Atheromatous irregularity of left more than right PCA branches. Venous sinuses: Patent Anatomic variants:  As above Delayed phase: No abnormal intracranial enhancement. Review of the MIP images confirms the above findings IMPRESSION: 1. Atherosclerosis most heavily involving the posterior circulation. 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 These stenoses lead to under opacification of left PCA vessels when compared to the right (which is fetal type). 2. Approximately 50% left supraclinoid ICA stenosis. 3. Atherosclerosis in the neck without flow limiting stenosis or ulceration. Electronically Signed   By: Monte Fantasia M.D.   On: 02/26/2017 13:55    Ct Head Wo Contrast   Result Date: 02/25/2017 CLINICAL DATA:  Blurred vision.  Unsteady gait. EXAM: CT HEAD WITHOUT CONTRAST TECHNIQUE: Contiguous axial images were obtained from the base of the skull through the vertex without intravenous contrast. COMPARISON:  None. FINDINGS: Brain: No acute intracranial abnormality. Specifically, no hemorrhage, hydrocephalus, mass lesion, acute infarction, or significant intracranial injury. Vascular: No hyperdense vessel or unexpected calcification. Skull: Prior craniotomy in the left posterior parietal region. No acute calvarial abnormality. Sinuses/Orbits: Visualized paranasal sinuses and mastoids clear. Orbital soft tissues unremarkable. Other: None IMPRESSION: No acute intracranial abnormality. Electronically Signed   By: Rolm Baptise M.D.   On: 02/25/2017 17:20    Ct Angio Neck W Or Wo Contrast   Result Date: 02/26/2017  CLINICAL DATA:  History of stroke. EXAM: CT ANGIOGRAPHY HEAD AND NECK TECHNIQUE: Multidetector CT imaging of the head and neck was performed using the standard protocol during bolus administration of intravenous contrast. Multiplanar CT image reconstructions and MIPs were obtained to evaluate the vascular anatomy. Carotid stenosis measurements (when applicable) are obtained utilizing NASCET criteria, using the distal internal carotid diameter as the denominator. CONTRAST:   50 cc Isovue 370 intravenous COMPARISON:  Brain MRI from yesterday FINDINGS: CTA NECK FINDINGS Aortic arch: Atheromatous wall thickening. Two vessel branching pattern. Right carotid system: Atheromatous wall thickening with small calcified plaque at the common carotid bifurcation. No flow limiting stenosis or ulceration. Negative for dissection or beading. Left carotid system: Mild atheromatous wall thickening. No stenosis or ulceration. Negative for beading. Vertebral arteries: Proximal subclavian atherosclerosis without flow limiting stenosis. Right dominant vertebral artery. Both vertebral arteries are smooth and widely patent to the dura Skeleton: Remote left parieto-occipital craniotomy, reportedly for hematoma evacuation. Other neck: No significant incidental finding. Upper chest: Negative Review of the MIP images confirms the above findings CTA HEAD FINDINGS Anterior circulation: Calcified and noncalcified plaque along the carotid siphons. Approximately 50% atheromatous narrowing at the left supraclinoid segment. Hypoplastic right A1 segment with robust anterior communicating artery. Fetal type right PCA. No branch occlusion or flow limiting stenosis. Posterior circulation: Atherosclerotic plaque on both V4 segments. Stenosis or occlusion of the left V4 segment beyond the patent pica, which is the not Ake at its origin. There is also a high-grade narrowing of the right V4 segment with small outpouching likely reflecting atheromatous plaque irregularity. Basilar artery is small in the setting of fetal type right PCA, with mild atheromatous irregularity and narrowing. Mild to moderate narrowing at the basilar left PCA junction. Best seen on axial MIPS, the left PCA is less dense than the right. Atheromatous irregularity of left more than right PCA branches. Venous sinuses: Patent Anatomic variants: As above Delayed phase: No abnormal intracranial enhancement. Review of the MIP images confirms the above findings  IMPRESSION: 1. Atherosclerosis most heavily involving the posterior circulation. 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 These stenoses lead to under opacification of left PCA vessels when compared to the right (which is fetal type). 2. Approximately 50% left supraclinoid ICA stenosis. 3. Atherosclerosis in the neck without flow limiting stenosis or ulceration. Electronically Signed   By: Monte Fantasia M.D.   On: 02/26/2017 13:55    [name]   Result Date: 02/25/2017 CLINICAL DATA:  Initial evaluation for intermittent blurry vision, double vision, unsteady gait. Evaluate for stroke. EXAM: MRI HEAD WITHOUT CONTRAST TECHNIQUE: Multiplanar, multiecho pulse sequences of the brain and surrounding structures were obtained without intravenous contrast. COMPARISON:  Prior CT from earlier the same day. FINDINGS: Brain: Age-appropriate cerebral atrophy. Minimal scattered T2/FLAIR hyperintensity within the periventricular and deep white matter both cerebral hemispheres, most like related chronic small vessel ischemic disease, felt to be within normal limits for age. Small remote lacunar infarct present within the left thalamus. 7 mm acute ischemic lacunar type infarct present within the inferior left basal ganglia (series 3, image 24). Mild scattered patchy subcentimeter cortical infarcts present within the left occipital lobe, left PCA distribution (series 3, image 31, 26). Few additional subcentimeter ischemic infarcts noted within the right cerebellar hemisphere (series 3, image 19, 15). No associated hemorrhage or mass effect. No other evidence for acute or subacute ischemia. Gray-white matter differentiation otherwise maintained. No mass lesion, midline  shift or mass effect. No hydrocephalus. No extra-axial fluid collection. Major dural sinuses grossly patent. Pituitary gland suprasellar region normal. Midline structures intact and normal. Vascular:  Major intracranial vascular flow voids are maintained. Skull and upper cervical spine: Craniocervical junction normal. Upper cervical spine normal. Bone marrow signal intensity within normal limits. Postoperative changes from prior left parietal craniotomy noted. No acute scalp soft tissue abnormality. Sinuses/Orbits: Globes orbital soft tissues within normal limits. Patient status post lens extraction bilaterally. Paranasal sinuses are clear. Trace opacity noted within the bilateral mastoid air cells, of doubtful significance. Inner ear structures normal. Other: None IMPRESSION: 1. Patchy small volume acute ischemic nonhemorrhagic subcentimeter infarcts involving the left basal ganglia, left parieto-occipital region, and right cerebellar hemisphere as above. 2. Small remote left thalamic lacunar infarct. 3. Sequelae of prior left parietal craniotomy. Electronically Signed   By: Jeannine Boga M.D.   On: 02/25/2017 20:59             Medical Problem List and Plan: 1.  Dizziness with gait disorder as well as visual disturbance secondary to 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 Status post loop recorder 2.  DVT Prophylaxis/Anticoagulation: Subcutaneous Lovenox. Monitor for any bleeding episodes 3. Pain Management: Tylenol as needed 4. Mood:  Provide emotional support 5. Neuropsych: This patient is capable of making decisions on his own behalf. 6. Skin/Wound Care:  Routine skin checks 7. Fluids/Electrolytes/Nutrition:  Routine I&O's with follow-up chemistries 8.Hypertension. 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 Hemoglobin A1c 6.5.SSI. Check blood sugars before meals and at bedtime. Diabetic teaching 12.Hyperlipidemia. Lipitor   Post Admission Physician Evaluation: 1. Functional deficits  secondary  to Left hemiparesis and gait disorder  Due to bilateral CVA. 2. Patient is admitted to receive collaborative, interdisciplinary care between the physiatrist, rehab nursing staff, and therapy team. 3. Patient's level of medical complexity and substantial therapy needs in context of that medical necessity cannot be provided at a lesser intensity of care such as a SNF. 4. Patient has experienced substantial functional loss from his/her baseline which was documented above under the "Functional History" and "Functional Status" headings.  Judging by the patient's diagnosis, physical exam, and functional history, the patient has potential for functional progress which will result in measurable gains while on inpatient rehab.  These gains will be of substantial and practical use upon discharge  in facilitating mobility and self-care at the household level. 5. Physiatrist will provide 24 hour management of medical needs as well as oversight of the therapy plan/treatment and provide guidance as appropriate regarding the interaction of the two. 6. The Preadmission Screening has been reviewed and patient status is unchanged unless otherwise stated above. 7. 24 hour rehab nursing will assist with bladder management, bowel management, safety, skin/wound care, disease management, medication administration, pain management and patient education  and help integrate therapy concepts, techniques,education, etc. 8. PT will assess and treat for/with: pre gait, gait training, endurance , safety, equipment, neuromuscular re education.   Goals are: Sup. 9. OT will assess and treat for/with: ADLs, Cognitive perceptual skills, Neuromuscular re education, safety, endurance, equipment.   Goals are: Mod I. Therapy may proceed with showering this patient. 10. SLP will assess and treat for/with: NA.  Goals are: NA. 11. Case Management and Social Worker will assess and treat for psychological issues and discharge  planning. 12. Team  conference will be held weekly to assess progress toward goals and to determine barriers to discharge. 13. Patient will receive at least 3 hours of therapy per day at least 5 days per week. 14. ELOS: 7-10d       15. Prognosis:  good         Charlett Blake M.D. [facility] FAAPM&R (Sports Med, Neuromuscular Med) Diplomate Am Board of Electrodiagnostic Med  Cathlyn Parsons, Hershal Coria 02/27/2017

## 2017-02-27 NOTE — Progress Notes (Signed)
  Echocardiogram Echocardiogram Transesophageal has been performed.  Bobbye Charleston 02/27/2017, 3:50 PM

## 2017-02-27 NOTE — PMR Pre-admission (Signed)
PMR Admission Coordinator Pre-Admission Assessment  Patient: Justin [name] is an 67 y.o., male MRN: [id number] DOB: [birth year] Height: 5\' 6"  (167.6 cm) Weight: 69 kg (152 lb 1.9 oz)              Insurance Information HMO: yes    PPO:      PCP:      IPA:      80/20:      OTHER: medicare advantage plan PRIMARY: [employer]      Policy#: [id number]      Subscriber: pt CM Name: Wyona Almas      Phone#: [phone] ext [phone]     Fax#: [phone] Pre-Cert#: [id number] approved for 7 days with f/u Cherice at ext [phone]      Employer: retired Benefits:  Phone #: [phone]     Name: 02/27/2017 Eff. Date: 01/28/2017     Deduct: none      Out of Pocket Max: $3400      Life Max: none CIR: $295 co pay per day days 1 to 6 days      SNF: no co pay days 1 to 20; $172 co pay per day days 21-100 Outpatient: $40 co pay per visit     Co-Pay: visits per medical neccesity Home Health: 100%      Co-Pay: visits per medical neccesity DME: 80%     Co-Pay: 20% Providers: in network  SECONDARY: none       Medicaid Application Date:       Case Manager:  Disability Application Date:       Case Worker:   Emergency Contact Information Contact Information    Name Relation Home Work [place] Son [phone]     Bernie Covey   [phone]   Lanny, Lipkin   [phone]     Current Medical History  Patient Admitting Diagnosis: CVA  History of Present Illness: HPI: Justin Bart Denisonis a 67 y.o.right handedmalewith history of hypertension, diabetes mellitus, traumatic ICH with craniotomy and 89 as well as CVA without residualdeficit [id number] maintained on aspirin 81 mg daily.  Presented 02/25/2017 with intermittent visual disturbances and decrease in balance that have persisted since Christmas 2018. Patient did have recent cataract surgery in August and November 2018. Noted blood pressure with systolic in the 250N. CT of the headreviewed, unremarkable for acute intracranial  process.Patient did not receive TPA. MRI the brain showed patchy small-volume acute ischemic nonhemorrhagic sub centimeter infarct left basal ganglia left parietal-occipital region and right cerebellar hemisphere. Small remote left thalamic lacunar infarction. Neurology consulted and workup currently ongoing.CT angiogram head and neck showed atherosclerosis most heavily involving the posterior circulation.approximately 50% left supraclinoid ICA stenosis Echocardiogram with ejection fraction of 39% grade 2 diastolic dysfunction.TEE and loop recorder placement 01/31/2019Currently maintained on aspirin for CVA prophylaxis. Subcutaneous Lovenox for DVT prophylaxis. Tolerating a regular consistency diet  .Total: 2 NIHSS    Past Medical History  Past Medical History:  Diagnosis Date  . Calcium oxalate renal stones   . Chewing tobacco use   . Gallstones    hx/o, s/p choleycystectomy  . H/O echocardiogram 10/2008   no wall thickness, EF 55%, mild mitral regurge  . Hyperlipidemia   . Hypertension    hx/o, noncompliance prior with medication  . Influenza vaccine refused 09/2013  . Rheumatic fever    age 64  . Stroke ([place]) 10/02/2008  . Tinnitus     Family History  family history includes Alcohol abuse in his father; Cirrhosis in  his brother; Diabetes in his brother; Heart disease (age of onset: 67) in his mother; Migraines in his mother.  Prior Rehab/Hospitalizations:  Has the patient had major surgery during 100 days prior to admission? No  Current Medications   Current Facility-Administered Medications:  .  acetaminophen (TYLENOL) tablet 650 mg, 650 mg, Oral, Q4H PRN **OR** acetaminophen (TYLENOL) solution 650 mg, 650 mg, Per Tube, Q4H PRN **OR** acetaminophen (TYLENOL) suppository 650 mg, 650 mg, Rectal, Q4H PRN, Smith, Rondell A, MD .  aspirin EC tablet 325 mg, 325 mg, Oral, Daily, Rosalin Hawking, MD .  atorvastatin (LIPITOR) tablet 40 mg, 40 mg, Oral, q1800, Fuller Plan A, MD, 40 mg at  02/26/17 1754 .  carvedilol (COREG) tablet 3.125 mg, 3.125 mg, Oral, BID WC, Croitoru, Mihai, MD, 3.125 mg at 02/27/17 0800 .  enoxaparin (LOVENOX) injection 40 mg, 40 mg, Subcutaneous, Q24H, Smith, Rondell A, MD, 40 mg at 02/26/17 1028 .  insulin aspart (novoLOG) injection 0-9 Units, 0-9 Units, Subcutaneous, TID WC, Choi, Jennifer, DO .  lisinopril (PRINIVIL,ZESTRIL) tablet 20 mg, 20 mg, Oral, Daily, Smith, Rondell A, MD, 20 mg at 02/26/17 1020 .  ondansetron (ZOFRAN) injection 4 mg, 4 mg, Intravenous, Q6H PRN, Tamala Julian, Rondell A, MD .  senna-docusate (Senokot-S) tablet 1 tablet, 1 tablet, Oral, QHS PRN, Norval Morton, MD  Patients Current Diet: Fall precautions Diet NPO time specified Diet - low sodium heart healthy  Precautions / Restrictions Precautions Precautions: Fall, Other (comment)(Diplopia) Restrictions Weight Bearing Restrictions: No   Has the patient had 2 or more falls or a fall with injury in the past year?No  Prior Activity Level Community (5-7x/wk): independent, driving; retired Actor / Paramedic Devices/Equipment: None Home Equipment: Shower seat  Prior Device Use: Indicate devices/aids used by the patient prior to current illness, exacerbation or injury? None of the above  Prior Functional Level Prior Function Level of Independence: Independent Comments: ADLs, IADLs, driving, retired, enjoys fishing regularly  Self Care: Did the patient need help bathing, dressing, using the toilet or eating?  Independent  Indoor Mobility: Did the patient need assistance with walking from room to room (with or without device)? Independent  Stairs: Did the patient need assistance with internal or external stairs (with or without device)? Independent  Functional Cognition: Did the patient need help planning regular tasks such as shopping or remembering to take medications? Independent  Current Functional Level Cognition   Arousal/Alertness: Awake/alert Overall Cognitive Status: Impaired/Different from baseline Orientation Level: Oriented X4 Safety/Judgement: Decreased awareness of safety General Comments: Pt continues to be very emotionally labile, became tearful with walking and abruptly leaned over walker  Attention: Sustained(decreased sustained attention due to internal issues/concerns/frustration with current event) Sustained Attention: Appears intact Memory: Impaired Memory Impairment: Retrieval deficit(pt recalled 3/5 words independently, 1/5 with cue, did not identify 1 with multiple choice) Awareness: Appears intact Problem Solving: Appears intact(problem solving to figure out today's date) Behaviors: Restless, Other (comment)(pt became tearful at end of session)    Extremity Assessment (includes Sensation/Coordination)  Upper Extremity Assessment: LUE deficits/detail LUE Deficits / Details: Slight decreased strength compared to RUE. Pt requiring numbness in fingertips. Decreased coordoniation as seen during grooming and finger-to-nose test LUE Sensation: (Reports numbness) LUE Coordination: decreased fine motor, decreased gross motor  Lower Extremity Assessment: LLE deficits/detail LLE Deficits / Details: ROM WFL, hip strength grossly 4/5, knee and ankle strength grossly 4+/5 LLE Sensation: (intact) LLE Coordination: decreased fine motor, decreased gross motor    ADLs  Overall ADL's : Needs assistance/impaired Eating/Feeding: NPO Grooming: Oral care, Wash/dry face, Min guard, Minimal assistance, Standing Grooming Details (indicate cue type and reason): Min Guard-Min A for standing balance. With static standing, pt able to maintain balance, but as soon as he adjusts his postition, he has LOB and requires physical A to recover. Pt demonstrating decreased depth perception when reaching for targets. Upper Body Bathing: Set up, Sitting Lower Body Bathing: Maximal assistance, Sit to/from stand Upper  Body Dressing : Set up, Sitting Lower Body Dressing: Maximal assistance, Sit to/from stand Lower Body Dressing Details (indicate cue type and reason): Max A for standing balance. Pt donning bedroom slippers at bed level and demosntrating WFL ROM Toilet Transfer: Minimal assistance, Ambulation, Maximal assistance Toilet Transfer Details (indicate cue type and reason): Max A for LOB and to correct. Min A overall Functional mobility during ADLs: Minimal assistance, Maximal assistance General ADL Comments: Pt demosntrating decreased fucnitonal performance and is very upset about change. Pt requiring Min A for stability in standing and Max A to recover from LOB. Pt presenting with diplopia.    Mobility  Overal bed mobility: Modified Independent General bed mobility comments: Increased time    Transfers  Overall transfer level: Needs assistance Equipment used: None Transfers: Sit to/from Stand Sit to Stand: Min assist General transfer comment: Min A to stabilize     Ambulation / Gait / Stairs / Wheelchair Mobility  Ambulation/Gait Ambulation/Gait assistance: Mod assist Ambulation Distance (Feet): 200 Feet Assistive device: Rolling walker (2 wheeled) Gait Pattern/deviations: Step-through pattern, Staggering right, Staggering left, Ataxic General Gait Details: modA for stabilizing in RW, vc for wider BoS, proximity to walker, and not leaning over walker when he became emotional about his deficits Gait velocity: slowed Gait velocity interpretation: Below normal speed for age/gender    Posture / Balance Balance Overall balance assessment: Needs assistance Sitting-balance support: No upper extremity supported, Feet supported Sitting balance-Leahy Scale: Fair Standing balance support: No upper extremity supported, During functional activity Standing balance-Leahy Scale: Poor Standing balance comment: Reliant on physical A to correct LOB    Special needs/care consideration BiPAP/CPAP   N/a CPM  N/a Continuous Drip IV  N/a Dialysis n/a Life Vest n/a Oxygen  N/a Special Bed  N/a Trach Size  N/a Wound Vac n/a Skin intact Bowel mgmt: no LBM documented Bladder mgmt: continent Diabetic mgmt  N/a   Previous Home Environment Living Arrangements: (girlfriend , [place])  Lives With: Significant other Available Help at Discharge: Available 24 hours/day(girlfriend, Mortimer Fries) Type of Home: House Home Layout: One level Home Access: Stairs to enter Entrance Stairs-Rails: Can reach both Entrance Stairs-Number of Steps: 4 Bathroom Shower/Tub: Multimedia programmer: Standard Bathroom Accessibility: Yes How Accessible: Accessible via walker Home Care Services: No Additional Comments: they switch between [place] and [employer] home in the summer  Discharge Living Setting Plans for Discharge Living Setting: Patient's home, Lives with (comment)(girlfriend, Bobbie) Type of Home at Discharge: House Discharge Home Layout: One level Discharge Home Access: Stairs to enter Entrance Stairs-Rails: Right, Left, Can reach both Entrance Stairs-Number of Steps: 4 Discharge Bathroom Shower/Tub: Walk-in shower Discharge Bathroom Toilet: Standard Discharge Bathroom Accessibility: Yes How Accessible: Accessible via walker Does the patient have any problems obtaining your medications?: No  Social/Family/Support Systems Patient Roles: Parent, Associate Professor Information: Jolayne Haines, girlfriend and son, Roselyn Reef Anticipated Caregiver: girlfriend, Mortimer Fries Anticipated Ambulance person Information: see above Ability/Limitations of Caregiver: no limitations Caregiver Availability: 24/7 Discharge Plan Discussed with Primary Caregiver: Yes Is Caregiver In Agreement with  Plan?: Yes Does Caregiver/Family have Issues with Lodging/Transportation while Pt is in Rehab?: No  Goals/Additional Needs Patient/Family Goal for Rehab: supervision with PT, OT, and SLP Expected length of stay: ELOS 9 to  13 days Pt/Family Agrees to Admission and willing to participate: Yes Program Orientation Provided & Reviewed with Pt/Caregiver Including Roles  & Responsibilities: Yes  Decrease burden of Care through IP rehab admission: n/a  Possible need for SNF placement upon discharge:not anticipated  Patient Condition: This patient's condition remains as documented in the consult dated 02/26/2017, in which the Rehabilitation Physician determined and documented that the patient's condition is appropriate for intensive rehabilitative care in an inpatient rehabilitation facility. Will admit to inpatient rehab today.  Preadmission Screen Completed By:  Cleatrice Burke, 02/27/2017 1:56 PM ______________________________________________________________________   Discussed status with Dr. Letta Pate on 02/27/2017 at  1405 and received telephone approval for admission today.  Admission Coordinator:  Cleatrice Burke, time 1031 Date 02/27/2017

## 2017-02-27 NOTE — Discharge Summary (Signed)
Physician Discharge Summary  MUHAMAD SERANO WSF:[id number] DOB: 07/09/1950 DOA: 02/25/2017  PCP: Carlena Hurl, PA-C  Admit date: 02/25/2017 Discharge date: 02/27/2017  Admitted From: Home Disposition:  CIR   Recommendations for Follow-up:  1. Follow up with PCP in 1 week 2. Follow up with [facility] in 6 weeks 3. Please call cardiology, plan for coronary angio once he is ready for discharge from inpatient rehab    Discharge Condition: Stable CODE STATUS: Full  Diet recommendation: Heart healthy   Brief/Interim Summary: Justin [name] is a 67 y.o.malewith medical history significant ofHTN, HLD, DM type II diet controlled,traumaticICH s/pcraniotomy in 1997,and CVAwithout residual deficit in 2010; whopresents with complaints of intermittent visual disturbances starting the week before Christmas of 2018.Patient reports having cataract surgery in August and November 2018. He reports having blurry vision and/or double vision that lasts a few seconds and then self resolved.Associated symptoms included intermittent episodes of feeling off balance with possible weakness in his lower extremities. He was seen by neurology at [facility] yesterday and recommended to have outpatient MRI and carotid Dopplers.He has had at least 2-3 episodes of visual changes. He went tohis ophthalmologist Dr.Groat,and noted to have a new right lower quadrant visual field deficit for which he was sent to the emergency department for further evaluation.   MRI reveals patchy acute ischemicsubcentimeter infarcts involving the left basal ganglia, left parieto-occipital region and right cerebellar hemisphere. A small remote left thalamic lacunar infarctand sequelae of prior left parietal craniotomywere also noted. Based on the multiple vascular territories involved, cardioembolic phenomenon is suspected. He was evaluated by stroke team and underwent TEE and loop recorder on 1/31.  Echo revealed new diagnosis with EF 35% and cardiology was consulted. They recommend plan for coronary angio.   Discharge Diagnoses:  Principal Problem:   CVA (cerebral vascular accident) ([place]) Active Problems:   Tobacco chew use   Essential hypertension   Diabetes type 2, controlled ([place])   Acute ischemic stroke ([place])   Dilated cardiomyopathy ([place])   Acute on chronic combined systolic and diastolic heart failure ([facility])   Benign essential HTN   Prediabetes   History of traumatic brain injury   History of CVA (cerebrovascular accident)   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[place])   Acute ischemic CVA -MRI reveals patchy acute ischemicsubcentimeter infarcts involving the left basal ganglia, left parieto-occipital region and right cerebellar hemisphere. A small remote left thalamic lacunar infarctand sequelae of prior left parietal craniotomywere also noted. Based on the multiple vascular territories involved, cardioembolic phenomenon is suspected.  -CTA head and neck showed atherosclerosis most heavily involving the posterior circulation. 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  -Stroke team following -TEE and loop recorder 1/31  -Aspirin 325mg , lipitor  -Follow up with [facility] in 6 weeks  New dx systolic CHF -Without volume overload, SOB, CP -EF 35% on echocardiogram -Cardiology consulted, plan for coronary angio once he is ready for discharge from inpatient rehab    Essential hypertension -Continue coreg and lisinopril  Diabetes mellitus type 2, controlled -Ha1c 6.5   Hyperlipidemia -Lipitor started   Chew tobaccouse -Counsel on the need of cessation of tobacco use   Discharge Instructions  Discharge Instructions    Ambulatory referral to Neurology   Complete by:  As directed    An appointment is requested in approximately: 6 weeks Follow up with stroke clinic Cecille Rubin preferred, if not available, then consider  Caesar Chestnut, Leta Baptist or  Jaynee Eagles whoever is available) at [facility] in about 6-8 weeks. Thanks.   Diet - low sodium heart healthy   Complete by:  As directed    Increase activity slowly   Complete by:  As directed      Allergies as of 02/27/2017   No Known Allergies     Medication List    STOP taking these medications   amLODipine 5 MG tablet Commonly known as:  NORVASC   aspirin 81 MG chewable tablet Replaced by:  aspirin 325 MG EC tablet     TAKE these medications   aspirin 325 MG EC tablet Take 1 tablet (325 mg total) by mouth daily. Start taking on:  02/28/2017 Replaces:  aspirin 81 MG chewable tablet   atorvastatin 40 MG tablet Commonly known as:  LIPITOR Take 1 tablet (40 mg total) by mouth daily at 6 PM.   carvedilol 3.125 MG tablet Commonly known as:  COREG Take 1 tablet (3.125 mg total) by mouth 2 (two) times daily with a meal.   lisinopril 20 MG tablet Commonly known as:  PRINIVIL,ZESTRIL Take 20 mg by mouth daily.   PRESERVISION AREDS 2 Caps Take 2 capsules by mouth daily.      Follow-up Information    Dennie Bible, NP. Schedule an appointment as soon as possible for a visit in 6 week(s).   Specialty:  Family Medicine Contact information: [address] [place] [address] [phone]        Tysinger, Camelia Eng, PA-C. Schedule an appointment as soon as possible for a visit in 1 week(s).   Specialty:  Family Medicine Contact information: [place] [address] [phone]        Sanda Klein, MD .   Specialty:  Cardiology Contact information: [address] [place] [address] [phone]          No Known Allergies  Consultations:  Neurology  Cardiology    Procedures/Studies: Ct Angio Head W Or Wo Contrast  Result Date: 02/26/2017 CLINICAL DATA:  History of stroke. EXAM: CT ANGIOGRAPHY HEAD AND NECK TECHNIQUE: Multidetector CT imaging of the head and neck was performed  using the standard protocol during bolus administration of intravenous contrast. Multiplanar CT image reconstructions and MIPs were obtained to evaluate the vascular anatomy. Carotid stenosis measurements (when applicable) are obtained utilizing NASCET criteria, using the distal internal carotid diameter as the denominator. CONTRAST:  50 cc Isovue 370 intravenous COMPARISON:  Brain MRI from yesterday FINDINGS: CTA NECK FINDINGS Aortic arch: Atheromatous wall thickening. Two vessel branching pattern. Right carotid system: Atheromatous wall thickening with small calcified plaque at the common carotid bifurcation. No flow limiting stenosis or ulceration. Negative for dissection or beading. Left carotid system: Mild atheromatous wall thickening. No stenosis or ulceration. Negative for beading. Vertebral arteries: Proximal subclavian atherosclerosis without flow limiting stenosis. Right dominant vertebral artery. Both vertebral arteries are smooth and widely patent to the dura Skeleton: Remote left parieto-occipital craniotomy, reportedly for hematoma evacuation. Other neck: No significant incidental finding. Upper chest: Negative Review of the MIP images confirms the above findings CTA HEAD FINDINGS Anterior circulation: Calcified and noncalcified plaque along the carotid siphons. Approximately 50% atheromatous narrowing at the left supraclinoid segment. Hypoplastic right A1 segment with robust anterior communicating artery. Fetal type right PCA. No branch occlusion or flow limiting stenosis. Posterior circulation: Atherosclerotic plaque on both V4 segments. Stenosis or occlusion of the left V4 segment beyond the patent pica, which is the not Ake at its  origin. There is also a high-grade narrowing of the right V4 segment with small outpouching likely reflecting atheromatous plaque irregularity. Basilar artery is small in the setting of fetal type right PCA, with mild atheromatous irregularity and narrowing. Mild to  moderate narrowing at the basilar left PCA junction. Best seen on axial MIPS, the left PCA is less dense than the right. Atheromatous irregularity of left more than right PCA branches. Venous sinuses: Patent Anatomic variants: As above Delayed phase: No abnormal intracranial enhancement. Review of the MIP images confirms the above findings IMPRESSION: 1. Atherosclerosis most heavily involving the posterior circulation. 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 These stenoses lead to under opacification of left PCA vessels when compared to the right (which is fetal type). 2. Approximately 50% left supraclinoid ICA stenosis. 3. Atherosclerosis in the neck without flow limiting stenosis or ulceration. Electronically Signed   By: Monte Fantasia M.D.   On: 02/26/2017 13:55   Ct Head Wo Contrast  Result Date: 02/25/2017 CLINICAL DATA:  Blurred vision.  Unsteady gait. EXAM: CT HEAD WITHOUT CONTRAST TECHNIQUE: Contiguous axial images were obtained from the base of the skull through the vertex without intravenous contrast. COMPARISON:  None. FINDINGS: Brain: No acute intracranial abnormality. Specifically, no hemorrhage, hydrocephalus, mass lesion, acute infarction, or significant intracranial injury. Vascular: No hyperdense vessel or unexpected calcification. Skull: Prior craniotomy in the left posterior parietal region. No acute calvarial abnormality. Sinuses/Orbits: Visualized paranasal sinuses and mastoids clear. Orbital soft tissues unremarkable. Other: None IMPRESSION: No acute intracranial abnormality. Electronically Signed   By: Rolm Baptise M.D.   On: 02/25/2017 17:20   Ct Angio Neck W Or Wo Contrast  Result Date: 02/26/2017 CLINICAL DATA:  History of stroke. EXAM: CT ANGIOGRAPHY HEAD AND NECK TECHNIQUE: Multidetector CT imaging of the head and neck was performed using the standard protocol during bolus administration of intravenous contrast. Multiplanar CT image  reconstructions and MIPs were obtained to evaluate the vascular anatomy. Carotid stenosis measurements (when applicable) are obtained utilizing NASCET criteria, using the distal internal carotid diameter as the denominator. CONTRAST:  50 cc Isovue 370 intravenous COMPARISON:  Brain MRI from yesterday FINDINGS: CTA NECK FINDINGS Aortic arch: Atheromatous wall thickening. Two vessel branching pattern. Right carotid system: Atheromatous wall thickening with small calcified plaque at the common carotid bifurcation. No flow limiting stenosis or ulceration. Negative for dissection or beading. Left carotid system: Mild atheromatous wall thickening. No stenosis or ulceration. Negative for beading. Vertebral arteries: Proximal subclavian atherosclerosis without flow limiting stenosis. Right dominant vertebral artery. Both vertebral arteries are smooth and widely patent to the dura Skeleton: Remote left parieto-occipital craniotomy, reportedly for hematoma evacuation. Other neck: No significant incidental finding. Upper chest: Negative Review of the MIP images confirms the above findings CTA HEAD FINDINGS Anterior circulation: Calcified and noncalcified plaque along the carotid siphons. Approximately 50% atheromatous narrowing at the left supraclinoid segment. Hypoplastic right A1 segment with robust anterior communicating artery. Fetal type right PCA. No branch occlusion or flow limiting stenosis. Posterior circulation: Atherosclerotic plaque on both V4 segments. Stenosis or occlusion of the left V4 segment beyond the patent pica, which is the not Ake at its origin. There is also a high-grade narrowing of the right V4 segment with small outpouching likely reflecting atheromatous plaque irregularity. Basilar artery is small in the setting of fetal type right PCA, with mild atheromatous irregularity and narrowing. Mild to moderate narrowing at the basilar left PCA junction. Best seen on axial MIPS, the  left PCA is less dense  than the right. Atheromatous irregularity of left more than right PCA branches. Venous sinuses: Patent Anatomic variants: As above Delayed phase: No abnormal intracranial enhancement. Review of the MIP images confirms the above findings IMPRESSION: 1. Atherosclerosis most heavily involving the posterior circulation. 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 These stenoses lead to under opacification of left PCA vessels when compared to the right (which is fetal type). 2. Approximately 50% left supraclinoid ICA stenosis. 3. Atherosclerosis in the neck without flow limiting stenosis or ulceration. Electronically Signed   By: Monte Fantasia M.D.   On: 02/26/2017 13:55   [name]  Result Date: 02/25/2017 CLINICAL DATA:  Initial evaluation for intermittent blurry vision, double vision, unsteady gait. Evaluate for stroke. EXAM: MRI HEAD WITHOUT CONTRAST TECHNIQUE: Multiplanar, multiecho pulse sequences of the brain and surrounding structures were obtained without intravenous contrast. COMPARISON:  Prior CT from earlier the same day. FINDINGS: Brain: Age-appropriate cerebral atrophy. Minimal scattered T2/FLAIR hyperintensity within the periventricular and deep white matter both cerebral hemispheres, most like related chronic small vessel ischemic disease, felt to be within normal limits for age. Small remote lacunar infarct present within the left thalamus. 7 mm acute ischemic lacunar type infarct present within the inferior left basal ganglia (series 3, image 24). Mild scattered patchy subcentimeter cortical infarcts present within the left occipital lobe, left PCA distribution (series 3, image 31, 26). Few additional subcentimeter ischemic infarcts noted within the right cerebellar hemisphere (series 3, image 19, 15). No associated hemorrhage or mass effect. No other evidence for acute or subacute ischemia. Gray-white matter differentiation otherwise  maintained. No mass lesion, midline shift or mass effect. No hydrocephalus. No extra-axial fluid collection. Major dural sinuses grossly patent. Pituitary gland suprasellar region normal. Midline structures intact and normal. Vascular: Major intracranial vascular flow voids are maintained. Skull and upper cervical spine: Craniocervical junction normal. Upper cervical spine normal. Bone marrow signal intensity within normal limits. Postoperative changes from prior left parietal craniotomy noted. No acute scalp soft tissue abnormality. Sinuses/Orbits: Globes orbital soft tissues within normal limits. Patient status post lens extraction bilaterally. Paranasal sinuses are clear. Trace opacity noted within the bilateral mastoid air cells, of doubtful significance. Inner ear structures normal. Other: None IMPRESSION: 1. Patchy small volume acute ischemic nonhemorrhagic subcentimeter infarcts involving the left basal ganglia, left parieto-occipital region, and right cerebellar hemisphere as above. 2. Small remote left thalamic lacunar infarct. 3. Sequelae of prior left parietal craniotomy. Electronically Signed   By: Jeannine Boga M.D.   On: 02/25/2017 20:59    Echo Study Conclusions  - Left ventricle: The cavity size was mildly dilated. Systolic   function was moderately reduced. The estimated ejection fraction   was in the range of 35% to 40%. There is akinesis of the   entireinferolateral, inferior, and inferoseptal myocardium.   Features are consistent with a pseudonormal left ventricular   filling pattern, with concomitant abnormal relaxation and   increased filling pressure (grade 2 diastolic dysfunction).   Doppler parameters are consistent with high ventricular filling   pressure. - Aortic valve: Trileaflet; mildly thickened, mildly calcified   leaflets. - Mitral valve: There was mild regurgitation. - Left atrium: The atrium was mildly dilated.   TEE IMPRESSION:  1. No LAA  thrombus 2. Negative for PFO 3. LVEF 35-40% with inferior and inferoseptal severe hypokinesis     Discharge Exam: Vitals:   02/27/17 1548 02/27/17 1650  BP: 129/69 129/69  Pulse: (!) 57 64  Resp: 12 18  Temp: 98 F (36.7 C)   SpO2: 97% 98%   Vitals:   02/27/17 1532 02/27/17 1535 02/27/17 1548 02/27/17 1650  BP: 140/62 (!) 143/71 129/69 129/69  Pulse: (!) 55 60 (!) 57 64  Resp: 13 18 12 18   Temp:   98 F (36.7 C)   TempSrc:   Oral   SpO2: 100% 100% 97% 98%  Weight:      Height:        General: Pt is alert, awake, not in acute distress Cardiovascular: RRR, S1/S2 +, no rubs, no gallops Respiratory: CTA bilaterally, no wheezing, no rhonchi Abdominal: Soft, NT, ND, bowel sounds + Extremities: no edema, no cyanosis    The results of significant diagnostics from this hospitalization (including imaging, microbiology, ancillary and laboratory) are listed below for reference.     Microbiology: Recent Results (from the past 240 hour(s))  Culture, blood (routine x 2)     Status: None (Preliminary result)   Collection Time: 02/26/17  3:30 AM  Result Value Ref Range Status   Specimen Description BLOOD LEFT ARM  Final   Special Requests IN PEDIATRIC BOTTLE Blood Culture adequate volume  Final   Culture NO GROWTH 1 DAY  Final   Report Status PENDING  Incomplete  Culture, blood (routine x 2)     Status: None (Preliminary result)   Collection Time: 02/26/17  3:35 AM  Result Value Ref Range Status   Specimen Description BLOOD LEFT ANTECUBITAL  Final   Special Requests IN PEDIATRIC BOTTLE Blood Culture adequate volume  Final   Culture NO GROWTH 1 DAY  Final   Report Status PENDING  Incomplete  MRSA PCR Screening     Status: None   Collection Time: 02/26/17  9:25 PM  Result Value Ref Range Status   MRSA by PCR NEGATIVE NEGATIVE Final    Comment:        The GeneXpert MRSA Assay (FDA approved for NASAL specimens only), is one component of a comprehensive MRSA  colonization surveillance program. It is not intended to diagnose MRSA infection nor to guide or monitor treatment for MRSA infections.      Labs: BNP (last 3 results) No results for input(s): BNP in the last 8760 hours. Basic Metabolic Panel: Recent Labs  Lab 02/25/17 1607 02/26/17 0328  NA 137 136  K 4.0 3.6  CL 101 104  CO2 25 24  GLUCOSE 96 157*  BUN 14 12  CREATININE 0.91 0.97  CALCIUM 9.4 8.7*   Liver Function Tests: Recent Labs  Lab 02/25/17 1607  AST 19  ALT 16*  ALKPHOS 72  BILITOT 0.6  PROT 7.2  ALBUMIN 4.0   No results for input(s): LIPASE, AMYLASE in the last 168 hours. No results for input(s): AMMONIA in the last 168 hours. CBC: Recent Labs  Lab 02/25/17 1607 02/26/17 0328  WBC 5.6 5.2  NEUTROABS 3.7  --   HGB 15.2 14.3  HCT 44.8 41.3  MCV 85.3 84.6  PLT 214 183   Cardiac Enzymes: Recent Labs  Lab 02/26/17 0328  CKTOTAL 56   BNP: Invalid input(s): POCBNP CBG: Recent Labs  Lab 02/26/17 1635 02/26/17 2144 02/27/17 0641 02/27/17 1211  GLUCAP 104* 146* 118* 110*   D-Dimer No results for input(s): DDIMER in the last 72 hours. Hgb A1c Recent Labs    02/26/17 0328  HGBA1C 6.5*   Lipid Profile Recent Labs    02/26/17 0328  CHOL 211*  HDL 41  LDLCALC 139*  TRIG 153*  CHOLHDL 5.1   Thyroid function studies No results for input(s): TSH, T4TOTAL, T3FREE, THYROIDAB in the last 72 hours.  Invalid input(s): FREET3 Anemia work up No results for input(s): VITAMINB12, FOLATE, FERRITIN, TIBC, IRON, RETICCTPCT in the last 72 hours. Urinalysis    Component Value Date/Time   COLORURINE YELLOW 09/01/2014 1021   APPEARANCEUR TURBID (A) 09/01/2014 1021   LABSPEC 1.020 09/01/2014 1021   PHURINE 8.0 09/01/2014 1021   GLUCOSEU NEGATIVE 09/01/2014 1021   HGBUR LARGE (A) 09/01/2014 1021   BILIRUBINUR n 02/24/2015 0956   KETONESUR 15 (A) 09/01/2014 1021   PROTEINUR n 02/24/2015 0956   PROTEINUR NEGATIVE 09/01/2014 1021    UROBILINOGEN negative 02/24/2015 0956   UROBILINOGEN 1.0 09/01/2014 1021   NITRITE n 02/24/2015 0956   NITRITE NEGATIVE 09/01/2014 1021   LEUKOCYTESUR Negative 02/24/2015 0956   Sepsis Labs Invalid input(s): PROCALCITONIN,  WBC,  LACTICIDVEN Microbiology Recent Results (from the past 240 hour(s))  Culture, blood (routine x 2)     Status: None (Preliminary result)   Collection Time: 02/26/17  3:30 AM  Result Value Ref Range Status   Specimen Description BLOOD LEFT ARM  Final   Special Requests IN PEDIATRIC BOTTLE Blood Culture adequate volume  Final   Culture NO GROWTH 1 DAY  Final   Report Status PENDING  Incomplete  Culture, blood (routine x 2)     Status: None (Preliminary result)   Collection Time: 02/26/17  3:35 AM  Result Value Ref Range Status   Specimen Description BLOOD LEFT ANTECUBITAL  Final   Special Requests IN PEDIATRIC BOTTLE Blood Culture adequate volume  Final   Culture NO GROWTH 1 DAY  Final   Report Status PENDING  Incomplete  MRSA PCR Screening     Status: None   Collection Time: 02/26/17  9:25 PM  Result Value Ref Range Status   MRSA by PCR NEGATIVE NEGATIVE Final    Comment:        The GeneXpert MRSA Assay (FDA approved for NASAL specimens only), is one component of a comprehensive MRSA colonization surveillance program. It is not intended to diagnose MRSA infection nor to guide or monitor treatment for MRSA infections.      Time coordinating discharge: 40 minutes  SIGNED:  Dessa Phi, DO [facility] Pager [phone]  If 7PM-7AM, please contact night-coverage [email or web address] Password [email or web address] 02/27/2017, 5:01 PM

## 2017-02-27 NOTE — Progress Notes (Signed)
Report from [address] room 14 from [place].To go to room [place], IP Rehab

## 2017-02-27 NOTE — Op Note (Signed)
LOOP RECORDER IMPLANT   Procedure report  Procedure performed:  Loop recorder implantation   Reason for procedure:  1. Cryptogenic stroke Procedure performed by:  Sanda Klein, MD  Complications:  None  Estimated blood loss:  <5 mL  Medications administered during procedure:  Lidocaine 1% with 1/10,000 epinephrine 10 mL locally Device details:  Medtronic Reveal Linq model number [id number], serial number [id number] Procedure details:  After the risks and benefits of the procedure were discussed the patient provided informed consent. The patient was prepped and draped in usual sterile fashion. Local anesthesia was administered to an area 2 cm to the left of the sternum in the 4th intercostal space. A cutaneous incision was made using the incision tool. The introducer was then used to create a subcutaneous tunnel and carefully deploy the device. Local pressure was held to ensure hemostasis.  The incision was closed with SteriStrips and a sterile dressing was applied.  R waves 0.83 mV.  Sanda Klein, MD, [place] [phone] office [phone] pager 02/27/2017 5:01 PM
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
## 2017-02-27 NOTE — Progress Notes (Signed)
OT Cancellation Note  Patient Details Name: Justin [name] MRN: [id number] DOB: 1950-06-08   Cancelled Treatment:    Reason Eval/Treat Not Completed: Patient at procedure or test/ unavailable.   Binnie Kand M.S., OTR/L Pager: [phone]  02/27/2017, 3:14 PM

## 2017-02-27 NOTE — CV Procedure (Signed)
TRANSESOPHAGEAL ECHOCARDIOGRAM (TEE) NOTE  INDICATIONS: stroke  PROCEDURE:   Informed consent was obtained prior to the procedure. The risks, benefits and alternatives for the procedure were discussed and the patient comprehended these risks.  Risks include, but are not limited to, cough, sore throat, vomiting, nausea, somnolence, esophageal and stomach trauma or perforation, bleeding, low blood pressure, aspiration, pneumonia, infection, trauma to the teeth and death.    After a procedural time-out, the patient was given 2 mg versed and 25 mcg fentanyl for moderate sedation.  The patient's heart rate, blood pressure, and oxygen saturation are monitored continuously during the procedure.The oropharynx was anesthetized 10 cc of topical 1% viscous lidocaine.  The transesophageal probe was inserted in the esophagus and stomach without difficulty and multiple views were obtained.  The patient was kept under observation until the patient left the procedure room.  The period of conscious sedation is 12 minutes, of which I was present face-to-face 100% of this time. The patient left the procedure room in stable condition.   Agitated microbubble saline contrast was not administered.  COMPLICATIONS:    There were no immediate complications.  Findings:  1. LEFT VENTRICLE: The left ventricular wall thickness is mildly increased.  The left ventricular cavity is dilated in size. Wall motion demonstrates severe inferior and inferoseptal hypokinesis.  LVEF is 35-40%.  2. RIGHT VENTRICLE:  The right ventricle is normal in structure and function without any thrombus or masses.    3. LEFT ATRIUM:  The left atrium is mildly dilated in size without any thrombus or masses.  There is not spontaneous echo contrast ("smoke") in the left atrium consistent with a low flow state.  4. LEFT ATRIAL APPENDAGE:  The left atrial appendage is free of any thrombus or masses. The appendage has single lobes. Pulse doppler  indicates moderate flow in the appendage.  5. ATRIAL SEPTUM:  The atrial septum appears intact and is free of thrombus and/or masses.  There is no evidence for interatrial shunting by color doppler and saline microbubble.  6. RIGHT ATRIUM:  The right atrium is normal in size and function without any thrombus or masses.  7. MITRAL VALVE:  The mitral valve is normal in structure and function with Mild regurgitation.  There were no vegetations or stenosis.  8. AORTIC VALVE:  The aortic valve is trileaflet, normal in structure and function with no regurgitation.  There were no vegetations or stenosis  9. TRICUSPID VALVE:  The tricuspid valve is normal in structure and function with Mild regurgitation.  There were no vegetations or stenosis  10.  PULMONIC VALVE:  The pulmonic valve is normal in structure and function with no regurgitation.  There were no vegetations or stenosis.   11. AORTIC ARCH, ASCENDING AND DESCENDING AORTA:  There was no Ron Parker et. Al, 1992) atherosclerosis of the ascending aorta, aortic arch, or proximal descending aorta.  12. PULMONARY VEINS: Anomalous pulmonary venous return was not noted.  13. PERICARDIUM: The pericardium appeared normal and non-thickened.  There is no pericardial effusion.  IMPRESSION:   1. No LAA thrombus 2. Negative for PFO 3. LVEF 35-40% with inferior and inferoseptal severe hypokinesis  RECOMMENDATIONS:    1.  No obvious cardiac cause of stroke - consider mural thrombus with evidence for possible ischemic cardiomyopathy given wall motion abnormality. Reasonable to proceed with loop recorder implant as well.  Time Spent Directly with the Patient:  45 minutes   Pixie Casino, MD, [facility], [place]  [facility]  Medical Director of the [facility] Lipid Disorders &  Cardiovascular Risk Reduction Clinic Diplomate of the American Board of Clinical Lipidology Attending Cardiologist  Direct Dial: [phone]  Fax: [phone]    Website:  [email or web address] 02/27/2017, 3:36 PM
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
## 2017-02-27 NOTE — H&P (Signed)
   INTERVAL PROCEDURE H&P  History and Physical Interval Note:  02/27/2017 3:17 PM  Justin [name] has presented today for their planned procedure. The various methods of treatment have been discussed with the patient and family. After consideration of risks, benefits and other options for treatment, the patient has consented to the procedure.  The patients' outpatient history has been reviewed, patient examined, and no change in status from most recent office note within the past 30 days. I have reviewed the patients' chart and labs and will proceed as planned. Questions were answered to the patient's satisfaction.   Pixie Casino, MD, [facility] [place] Director of the [facility] Lipid [facility] &  Cardiovascular Risk Reduction Clinic Diplomate of the American Board of Clinical Lipidology Attending Cardiologist  Direct Dial: [phone]  Fax: [phone]  Website:  [email or web address] 02/27/2017, 3:17 PM

## 2017-02-27 NOTE — Care Management Note (Signed)
Case Management Note  Patient Details  Name: Justin [name] MRN: [id number] Date of Birth: 1950-12-21  Subjective/Objective:                    Action/Plan: Pt discharging to CIR after TEE and possible loop today. No further needs per CM.  Expected Discharge Date:  02/27/17               Expected Discharge Plan:  [place]  In-House Referral:     Discharge planning Services  CM Consult  Post Acute Care Choice:    Choice offered to:     DME Arranged:    DME Agency:     HH Arranged:    HH Agency:     Status of Service:  Completed, signed off  If discussed at [employer], dates discussed:    Additional Comments:  Pollie Friar, RN 02/27/2017, 1:55 PM

## 2017-02-28 ENCOUNTER — Inpatient Hospital Stay (HOSPITAL_COMMUNITY): Payer: Medicare HMO | Admitting: Occupational Therapy

## 2017-02-28 ENCOUNTER — Inpatient Hospital Stay (HOSPITAL_COMMUNITY): Payer: Medicare HMO | Admitting: Physical Therapy

## 2017-02-28 ENCOUNTER — Encounter (HOSPITAL_COMMUNITY): Payer: Self-pay

## 2017-02-28 ENCOUNTER — Other Ambulatory Visit: Payer: Self-pay

## 2017-02-28 DIAGNOSIS — I639 Cerebral infarction, unspecified: Secondary | ICD-10-CM

## 2017-02-28 DIAGNOSIS — E119 Type 2 diabetes mellitus without complications: Secondary | ICD-10-CM

## 2017-02-28 LAB — GLUCOSE, CAPILLARY
GLUCOSE-CAPILLARY: 161 mg/dL — AB (ref 65–99)
GLUCOSE-CAPILLARY: 122 mg/dL — AB (ref 65–99)
GLUCOSE-CAPILLARY: 78 mg/dL (ref 65–99)
GLUCOSE-CAPILLARY: 132 mg/dL — AB (ref 65–99)
Glucose-Capillary: 124 mg/dL — ABNORMAL HIGH (ref 65–99)

## 2017-02-28 LAB — COMPREHENSIVE METABOLIC PANEL
ALT: 12 U/L — ABNORMAL LOW (ref 17–63)
AST: 18 U/L (ref 15–41)
Albumin: 3.6 g/dL (ref 3.5–5.0)
Alkaline Phosphatase: 61 U/L (ref 38–126)
Anion gap: 11 (ref 5–15)
BUN: 15 mg/dL (ref 6–20)
CHLORIDE: 107 mmol/L (ref 101–111)
CO2: 21 mmol/L — AB (ref 22–32)
Calcium: 8.9 mg/dL (ref 8.9–10.3)
Creatinine, Ser: 0.89 mg/dL (ref 0.61–1.24)
Glucose, Bld: 124 mg/dL — ABNORMAL HIGH (ref 65–99)
POTASSIUM: 4.2 mmol/L (ref 3.5–5.1)
SODIUM: 139 mmol/L (ref 135–145)
Total Bilirubin: 0.5 mg/dL (ref 0.3–1.2)
Total Protein: 6.1 g/dL — ABNORMAL LOW (ref 6.5–8.1)

## 2017-02-28 LAB — CBC WITH DIFFERENTIAL/PLATELET
BASOS ABS: 0 10*3/uL (ref 0.0–0.1)
Basophils Relative: 0 %
EOS ABS: 0.1 10*3/uL (ref 0.0–0.7)
EOS PCT: 2 %
HCT: 42.3 % (ref 39.0–52.0)
Hemoglobin: 14.2 g/dL (ref 13.0–17.0)
Lymphocytes Relative: 19 %
Lymphs Abs: 1.2 10*3/uL (ref 0.7–4.0)
MCH: 28.5 pg (ref 26.0–34.0)
MCHC: 33.6 g/dL (ref 30.0–36.0)
MCV: 84.9 fL (ref 78.0–100.0)
MONO ABS: 0.6 10*3/uL (ref 0.1–1.0)
Monocytes Relative: 9 %
Neutro Abs: 4.5 10*3/uL (ref 1.7–7.7)
Neutrophils Relative %: 70 %
PLATELETS: 172 10*3/uL (ref 150–400)
RBC: 4.98 MIL/uL (ref 4.22–5.81)
RDW: 13.8 % (ref 11.5–15.5)
WBC: 6.5 10*3/uL (ref 4.0–10.5)

## 2017-02-28 NOTE — Progress Notes (Signed)
Loop recorder surgical dressing a little bloody, but no active bleeding and no hematoma. Dressing can be removed tomorrow, Steristrips should be left in place until they come of spontaneously. Seems to be enjoying inpatient rehab. Will make an outpatient appt to discuss coronary angiography a few weeks after his CVA. Please call Cardiology if there are any issues over the weekend.  Sanda Klein, MD, [facility] [phone] office [phone] pager'

## 2017-02-28 NOTE — Progress Notes (Signed)
[place] PHYSICAL MEDICINE & REHABILITATION     PROGRESS NOTE  Subjective/Complaints:  Patient seen lying in bed this morning. He states he slept well overnight. He is ready for therapies.  ROS: Denies CP, SOB, nausea, vomiting, diarrhea.  Objective: Vital Signs: Blood pressure (P) 135/70, pulse (!) (P) 59, temperature (P) 98.2 F (36.8 C), temperature source (P) Oral, resp. rate (P) 18, height 5\' 6"  (1.676 m), weight 74.3 kg (163 lb 12.8 oz), SpO2 (P) 95 %. Ct Angio Head W Or Wo Contrast  Result Date: 02/26/2017 CLINICAL DATA:  History of stroke. EXAM: CT ANGIOGRAPHY HEAD AND NECK TECHNIQUE: Multidetector CT imaging of the head and neck was performed using the standard protocol during bolus administration of intravenous contrast. Multiplanar CT image reconstructions and MIPs were obtained to evaluate the vascular anatomy. Carotid stenosis measurements (when applicable) are obtained utilizing NASCET criteria, using the distal internal carotid diameter as the denominator. CONTRAST:  50 cc Isovue 370 intravenous COMPARISON:  Brain MRI from yesterday FINDINGS: CTA NECK FINDINGS Aortic arch: Atheromatous wall thickening. Two vessel branching pattern. Right carotid system: Atheromatous wall thickening with small calcified plaque at the common carotid bifurcation. No flow limiting stenosis or ulceration. Negative for dissection or beading. Left carotid system: Mild atheromatous wall thickening. No stenosis or ulceration. Negative for beading. Vertebral arteries: Proximal subclavian atherosclerosis without flow limiting stenosis. Right dominant vertebral artery. Both vertebral arteries are smooth and widely patent to the dura Skeleton: Remote left parieto-occipital craniotomy, reportedly for hematoma evacuation. Other neck: No significant incidental finding. Upper chest: Negative Review of the MIP images confirms the above findings CTA HEAD FINDINGS Anterior circulation: Calcified and noncalcified plaque  along the carotid siphons. Approximately 50% atheromatous narrowing at the left supraclinoid segment. Hypoplastic right A1 segment with robust anterior communicating artery. Fetal type right PCA. No branch occlusion or flow limiting stenosis. Posterior circulation: Atherosclerotic plaque on both V4 segments. Stenosis or occlusion of the left V4 segment beyond the patent pica, which is the not Ake at its origin. There is also a high-grade narrowing of the right V4 segment with small outpouching likely reflecting atheromatous plaque irregularity. Basilar artery is small in the setting of fetal type right PCA, with mild atheromatous irregularity and narrowing. Mild to moderate narrowing at the basilar left PCA junction. Best seen on axial MIPS, the left PCA is less dense than the right. Atheromatous irregularity of left more than right PCA branches. Venous sinuses: Patent Anatomic variants: As above Delayed phase: No abnormal intracranial enhancement. Review of the MIP images confirms the above findings IMPRESSION: 1. Atherosclerosis most heavily involving the posterior circulation. 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 These stenoses lead to under opacification of left PCA vessels when compared to the right (which is fetal type). 2. Approximately 50% left supraclinoid ICA stenosis. 3. Atherosclerosis in the neck without flow limiting stenosis or ulceration. Electronically Signed   By: Monte Fantasia M.D.   On: 02/26/2017 13:55   Ct Angio Neck W Or Wo Contrast  Result Date: 02/26/2017 CLINICAL DATA:  History of stroke. EXAM: CT ANGIOGRAPHY HEAD AND NECK TECHNIQUE: Multidetector CT imaging of the head and neck was performed using the standard protocol during bolus administration of intravenous contrast. Multiplanar CT image reconstructions and MIPs were obtained to evaluate the vascular anatomy. Carotid stenosis measurements (when applicable) are obtained utilizing NASCET  criteria, using the distal internal carotid diameter as the denominator. CONTRAST:  50 cc Isovue 370 intravenous COMPARISON:  Brain MRI from yesterday FINDINGS: CTA NECK FINDINGS Aortic arch: Atheromatous wall thickening. Two vessel branching pattern. Right carotid system: Atheromatous wall thickening with small calcified plaque at the common carotid bifurcation. No flow limiting stenosis or ulceration. Negative for dissection or beading. Left carotid system: Mild atheromatous wall thickening. No stenosis or ulceration. Negative for beading. Vertebral arteries: Proximal subclavian atherosclerosis without flow limiting stenosis. Right dominant vertebral artery. Both vertebral arteries are smooth and widely patent to the dura Skeleton: Remote left parieto-occipital craniotomy, reportedly for hematoma evacuation. Other neck: No significant incidental finding. Upper chest: Negative Review of the MIP images confirms the above findings CTA HEAD FINDINGS Anterior circulation: Calcified and noncalcified plaque along the carotid siphons. Approximately 50% atheromatous narrowing at the left supraclinoid segment. Hypoplastic right A1 segment with robust anterior communicating artery. Fetal type right PCA. No branch occlusion or flow limiting stenosis. Posterior circulation: Atherosclerotic plaque on both V4 segments. Stenosis or occlusion of the left V4 segment beyond the patent pica, which is the not Ake at its origin. There is also a high-grade narrowing of the right V4 segment with small outpouching likely reflecting atheromatous plaque irregularity. Basilar artery is small in the setting of fetal type right PCA, with mild atheromatous irregularity and narrowing. Mild to moderate narrowing at the basilar left PCA junction. Best seen on axial MIPS, the left PCA is less dense than the right. Atheromatous irregularity of left more than right PCA branches. Venous sinuses: Patent Anatomic variants: As above Delayed phase: No  abnormal intracranial enhancement. Review of the MIP images confirms the above findings IMPRESSION: 1. Atherosclerosis most heavily involving the posterior circulation. 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 These stenoses lead to under opacification of left PCA vessels when compared to the right (which is fetal type). 2. Approximately 50% left supraclinoid ICA stenosis. 3. Atherosclerosis in the neck without flow limiting stenosis or ulceration. Electronically Signed   By: Monte Fantasia M.D.   On: 02/26/2017 13:55   Recent Labs    02/26/17 0328 02/28/17 0709  WBC 5.2 6.5  HGB 14.3 14.2  HCT 41.3 42.3  PLT 183 172   Recent Labs    02/26/17 0328 02/28/17 0709  NA 136 139  K 3.6 4.2  CL 104 107  GLUCOSE 157* 124*  BUN 12 15  CREATININE 0.97 0.89  CALCIUM 8.7* 8.9   CBG (last 3)  Recent Labs    02/27/17 1211 02/27/17 2055 02/28/17 0651  GLUCAP 110* 161* 122*    Wt Readings from Last 3 Encounters:  02/27/17 74.3 kg (163 lb 12.8 oz)  02/26/17 69 kg (152 lb 1.9 oz)  04/01/16 70.7 kg (155 lb 12.8 oz)    Physical Exam:  BP (P) 135/70 (BP Location: Right Arm)   Pulse (!) (P) 59   Temp (P) 98.2 F (36.8 C) (Oral)   Resp (P) 18   Ht 5\' 6"  (1.676 m)   Wt 74.3 kg (163 lb 12.8 oz)   SpO2 (P) 95%   BMI 26.44 kg/m  Gen.: Well-developed, well-nourished. NAD. Head:Normocephalic. Atraumatic Eyes:EOMare normal. No discharge.  Cardiovascular:Normal rate,regular rhythmand no JVD.  Respiratory:Effort normaland breath sounds normal. Norespiratory distress.  TD:DUKGU sounds are normal. He exhibitsno distension. Musculoskeletal: He exhibits noedemaor tenderness. Neurological: He isalertand oriented. Followed full commands Motor: RUE/RLE: 5/5 proximal to distal LUE/LLE: 4+/5 proximal to distal Sensation intact to light touch Skin. Warm and dry Psych: Atypical affect  Assessment/Plan: 1. Functional deficits secondary to  left basal ganglia, left parietal occipital region, and right cerebellar hemisphere infarcts which require 3+ hours per day of interdisciplinary therapy in a comprehensive inpatient rehab setting. Physiatrist is providing close team supervision and 24 hour management of active medical problems listed below. Physiatrist and rehab team continue to assess barriers to discharge/monitor patient progress toward functional and medical goals.  Function:  Bathing Bathing position      Bathing parts      Bathing assist        Upper Body Dressing/Undressing Upper body dressing                    Upper body assist        Lower Body Dressing/Undressing Lower body dressing                                  Lower body assist        Toileting Toileting          Toileting assist     Transfers Chair/bed transfer             Locomotion Ambulation           Wheelchair          Cognition Comprehension    Expression    Social Interaction    Problem Solving    Memory      Medical Problem List and Plan: 1.Dizziness with gait disorder as well as visual disturbancesecondary to 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 Status post loop recorder.   Begin CIR 2. DVT Prophylaxis/Anticoagulation: Subcutaneous Lovenox. Monitor for any bleeding episodes 3. Pain Management:Tylenol as needed 4. Mood:Provide emotional support 5. Neuropsych: This patientis?fully capable of making decisions on hisown behalf. 6. Skin/Wound Care:Routine skin checks 7. Fluids/Electrolytes/Nutrition:Routine I&O's    BMP within acceptable range on 2/1 8.Hypertension. 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 Hemoglobin A1c 6.5.SSI. Check blood  sugars before meals and at bedtime. Diabetic teaching   Monitor with increased mobility 12.Hyperlipidemia. Lipitor  LOS (Days) 1 A FACE TO FACE EVALUATION WAS PERFORMED  Buddy Loeffelholz Lorie Phenix 02/28/2017 10:11 AM

## 2017-02-28 NOTE — Consult Note (Signed)
            [facility] CM Primary Care Navigator  02/28/2017  Justin [name] 1950/10/03 [id number]   Went to see patient at the bedside to identify possible discharge needs but [facility] ([facility] 4M 11) per staff report.  Patient noted with order for EMMI Stroke calls to follow-up recovery already in place.  Patient has discharge instruction to follow-up with primary care provider in 1 week.   For additional questions please contact:  Edwena Felty A. Bilal Manzer, BSN, RN-BC [facility] PRIMARY CARE Navigator Cell: [phone]

## 2017-02-28 NOTE — Evaluation (Signed)
Physical Therapy Assessment and Plan  Patient Details  Name: Justin [name] MRN: [id number] Date of Birth: 1951-01-23  PT Diagnosis: Abnormality of gait, Ataxia, Ataxic gait, Coordination disorder, Difficulty walking, Hemiplegia non-dominant and Muscle weakness Rehab Potential: Excellent ELOS: 7-10 days    Today's Date: 02/28/2017 PT Individual Time: 0900-1000 PT Individual Time Calculation (min): 60 min    Problem List:  Patient Active Problem List   Diagnosis Date Noted  . Diabetes mellitus type 2 in nonobese (HCC)   . Left basal ganglia embolic stroke ([place]) 97/67/3419  . Gait disturbance, post-stroke   . 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 Diabetes type 2, controlled ([place]) 02/26/2017  . Acute ischemic stroke ([place]) 02/26/2017  . Dilated cardiomyopathy ([place])   . Acute on chronic combined systolic and diastolic heart failure ([place])   . Benign essential HTN   . Prediabetes   . History of traumatic brain injury   . History of CVA (cerebrovascular accident)   . 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[place])   . CVA (cerebral vascular accident) ([place]) 02/25/2017  . Medicare annual wellness visit, subsequent 04/01/2016  . Impaired fasting blood sugar 04/01/2016  . Vaccine refused by patient 04/01/2016  . Cramping of feet 04/01/2016  . History of kidney stones 02/24/2015  . Noncompliance 02/24/2015  . Hyperlipidemia 02/24/2015  . Essential hypertension 02/24/2015  . Routine general medical examination at a health care facility 02/24/2015  . Corn of foot 02/24/2015  . Paresthesia of left foot 02/24/2015  . Advanced directives, counseling/discussion 02/24/2015  . Vaccine counseling 02/24/2015  . Tobacco chew use 05/06/2011    Past Medical History:  Past Medical History:  Diagnosis Date  . Calcium oxalate renal stones   . Chewing tobacco use   . Gallstones    hx/o, s/p choleycystectomy  . H/O echocardiogram 10/2008   no wall  thickness, EF 55%, mild mitral regurge  . Hyperlipidemia   . Hypertension    hx/o, noncompliance prior with medication  . Influenza vaccine refused 09/2013  . Rheumatic fever    age 5  . Stroke ([place]) 10/02/2008  . Tinnitus    Past Surgical History:  Past Surgical History:  Procedure Laterality Date  . blood clot  1997   brain  . BRAIN SURGERY  1997   craniotomy, evacuation of hematoma  . CHOLECYSTECTOMY  09/06/2011   Procedure: LAPAROSCOPIC CHOLECYSTECTOMY WITH INTRAOPERATIVE CHOLANGIOGRAM;  Surgeon: Earnstine Regal, MD;  Location: [facility];  Service: General;  Laterality: N/A;  . COLONOSCOPY  01/04/14   normal. Dr. Owens Loffler  . Dental implant    . JOINT REPLACEMENT  2004   LEFT BICEP REPAIR, RTC repair  . LOOP RECORDER INSERTION N/A 02/27/2017   Procedure: LOOP RECORDER INSERTION;  Surgeon: Sanda Klein, MD;  Location: [facility];  Service: Cardiovascular;  Laterality: N/A;  . LOOP RECORDER INSERTION N/A 02/27/2017   Procedure: LOOP RECORDER INSERTION;  Surgeon: Pixie Casino, MD;  Location: [facility];  Service: Cardiovascular;  Laterality: N/A;  . ROTATOR CUFF REPAIR     Right  . SIGMOIDOSCOPY    . TEE WITHOUT CARDIOVERSION N/A 02/27/2017   Procedure: TRANSESOPHAGEAL ECHOCARDIOGRAM (TEE);  Surgeon: Pixie Casino, MD;  Location[facility];  Service: Cardiovascular;  Laterality: N/A;    Assessment & Plan Clinical Impression: Patient is a 67 y.o.right handedmalewith history of hypertension, diabetes mellitus, traumatic ICH with craniotomy and 89 as well as CVA without residualdeficit in2085mintained on aspirin  81 mg daily. Per chart review,patient, and "[name]", patientlives with spouse. One level home 4 steps to entry. Independent retired driving and active prior to admission. Presented 02/25/2017 with intermittent visual disturbances and decrease in balance that have persisted since Christmas 2018. Patient did have recent cataract surgery in August and  November 2018. Noted blood pressure with systolic in the 144Y. CT of the headreviewed, unremarkable for acute intracranial process.Patient did not receive TPA. MRI the brain showed patchy small-volume acute ischemic nonhemorrhagic subcentimeter infarct left basal ganglia left parietal-occipital region and right cerebellar hemisphere. Small remote left thalamic lacunar infarction. Neurology consulted and workup currently ongoing.CT angiogram head andneck showed atherosclerosis most heavily involving the posterior circulation.approximately 50% left supraclinoid ICA stenosisEchocardiogram with ejection fraction of 18% grade 2 diastolic dysfunction.TEEwith results pendingand loop recorder placement 01/31/2019Currently maintained on aspirin for CVA prophylaxis. Subcutaneous Lovenox for DVT prophylaxis. Tolerating a regular consistency diet.Physical and occupational therapy evaluations completed and ongoing with recommendations of physical medicine rehabilitation consult. Patient transferred to CIR on 02/27/2017 .   Patient currently requires min with mobility secondary to muscle weakness, decreased cardiorespiratoy endurance, impaired timing and sequencing, unbalanced muscle activation, ataxia and decreased coordination, decreased visual perceptual skills and decreased standing balance, hemiplegia and decreased balance strategies.  Prior to hospitalization, patient was independent  with mobility and lived with Significant other in a House home.  Home access is 4Stairs to enter.  Patient will benefit from skilled PT intervention to maximize safe functional mobility, minimize fall risk and decrease caregiver burden for planned discharge home with 24 hour supervision.  Anticipate patient will benefit from follow up OP at discharge.  PT - End of Session Activity Tolerance: Tolerates 10 - 20 min activity with multiple rests Endurance Deficit: Yes Endurance Deficit Description: decreased PT Assessment Rehab  Potential (ACUTE/IP ONLY): Excellent PT Barriers to Discharge: Inaccessible home environment;Behavior PT Barriers to Discharge Comments: mildly restless and impulsive PT Patient demonstrates impairments in the following area(s): Balance;Behavior;Endurance;Motor;Perception;Safety PT Transfers Functional Problem(s): Bed Mobility;Car;Bed to Chair;Furniture;Floor PT Locomotion Functional Problem(s): Stairs;Ambulation PT Plan PT Intensity: Minimum of 1-2 x/day ,45 to 90 minutes PT Frequency: 5 out of 7 days PT Duration Estimated Length of Stay: 7-10 days  PT Treatment/Interventions: Ambulation/gait training;Disease management/prevention;Pain management;Stair training;Wheelchair propulsion/positioning;Therapeutic Activities;Visual/perceptual remediation/compensation;DME/adaptive equipment instruction;Patient/family education;Balance/vestibular training;Therapeutic Exercise;Psychosocial support;Functional electrical stimulation;Cognitive remediation/compensation;Community reintegration;Functional mobility training;Skin care/wound management;UE/LE Strength taining/ROM;Splinting/orthotics;UE/LE Coordination activities;Discharge planning;Neuromuscular re-education PT Transfers Anticipated Outcome(s): Mod I  PT Locomotion Anticipated Outcome(s): Supervision gait  PT Recommendation Follow Up Recommendations: Outpatient PT(pt girlfriend, Mortimer Fries, can drive him ) Patient destination: Home Equipment Recommended: To be determined  Skilled Therapeutic Intervention  Pt supine and agreeable to therapy, no c/o pain. Pt instructed patient in PT Evaluation and initiated treatment intervention; see below for results. Additionally performed [employer] and educated girlfriend Mortimer Fries) on ambulating w/ pt to/from bathroom w/ min assist. She returned demonstration correctly and safely. Pt educated patient in [place], rehab potential, rehab goals, and discharge recommendations. Cautioned pt and wife on safety w/ sitting  EOB as pt prefers this. Educated both on results of Berg and balance impairments although pt's static sitting balance is at a supervision level. Discussed purposes of bed alarms and safety for pt's while in hospital. Girlfriend Mortimer Fries is in agreement to notify nursing via call light when she is leaving for nursing to set bed alarm. Both verbalize understanding and in agreement that pt requires supervision when sitting at the side of the bed. Ended session sitting EOB and in care of  girlfriend, all needs met.   PT Evaluation Precautions/Restrictions Precautions Precautions: Fall Restrictions Weight Bearing Restrictions: No Pain Pain Assessment Pain Assessment: No/denies pain Home Living/Prior Functioning Home Living Available Help at Discharge: Available 24 hours/day Type of Home: House Home Access: Stairs to enter [employer] of Steps: 4 Entrance Stairs-Rails: None Home Layout: One level Bathroom Shower/Tub: Multimedia programmer: Standard Bathroom Accessibility: Yes Additional Comments: they switch between 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 Leisure: Hobbies-yes (Comment) Comments: fishing  Vision/Perception  Vision - Assessment Eye Alignment: Within Functional Limits Ocular Range of Motion: Within Functional Limits Alignment/Gaze Preference: Within Defined Limits Tracking/Visual Pursuits: Able to track stimulus in all quads without difficulty Saccades: Overshoots Perception Perception: Impaired(impaired depth perception at baseline) Praxis Praxis: Intact  Cognition Overall Cognitive Status: Within Functional Limits for tasks  assessed Arousal/Alertness: Awake/alert Orientation Level: Oriented X4 Attention: Focused Focused Attention: Appears intact Sustained Attention: Appears intact Memory: Appears intact Awareness: Appears intact Problem Solving: Appears intact Behaviors: Restless;Impulsive Sensation Sensation Light Touch: Appears Intact Proprioception: Appears Intact Coordination Gross Motor Movements are Fluid and Coordinated: No Fine Motor Movements are Fluid and Coordinated: No Finger Nose Finger Test: Impaired LUE Heel Shin Test: Impaired LLE Motor  Motor Motor: Ataxia Motor - Skilled Clinical Observations: ataxic w/ all functional movements  Mobility Bed Mobility Bed Mobility: Rolling Right;Rolling Left;Supine to Sit Rolling Right: 5: Supervision Rolling Left: 5: Supervision Supine to Sit: 5: Supervision;HOB flat Transfers Transfers: Yes Sit to Stand: 4: Min guard Stand to Sit: 4: Min guard Stand Pivot Transfers: 4: Min Psychologist, occupational Details: Verbal cues for precautions/safety;Verbal cues for safe use of DME/AE Locomotion  Ambulation Ambulation: Yes Ambulation Distance (Feet): 150 Feet Assistive device: Rolling walker Gait Gait velocity: slowed Stairs / Additional Locomotion Stairs: Yes Stairs Assistance: 4: Min assist Stairs Assistance Details: Verbal cues for technique;Verbal cues for precautions/safety Stair Management Technique: Two rails Number of Stairs: 4 Height of Stairs: 6 Wheelchair Mobility Wheelchair Mobility: No  Trunk/Postural Assessment  Cervical Assessment Cervical Assessment: Within Functional Limits Thoracic Assessment Thoracic Assessment: Within Functional Limits Lumbar Assessment Lumbar Assessment: Exceptions to WFL(posterior pelvic tilt) Postural Control Postural Control: Within Functional Limits  Balance Balance Balance Assessed: Yes Standardized Balance Assessment Standardized Balance Assessment: Berg Balance Test Berg Balance  Test Sit to Stand: Able to stand without using hands and stabilize independently Standing Unsupported: Able to stand 2 minutes with supervision Sitting with Back Unsupported but Feet Supported on Floor or Stool: Able to sit safely and securely 2 minutes Stand to Sit: Sits safely with minimal use of hands Transfers: Able to transfer safely, definite need of hands Standing Unsupported with Eyes Closed: Able to stand 10 seconds safely Standing Ubsupported with Feet Together: Able to place feet together independently but unable to hold for 30 seconds From Standing, Reach Forward with Outstretched Arm: Can reach forward >5 cm safely (2") From Standing Position, Pick up Object from Floor: Able to pick up shoe, needs supervision From Standing Position, Turn to Look Behind Over each Shoulder: Needs supervision when turning Turn 360 Degrees: Needs close supervision or verbal cueing Standing Unsupported, Alternately Place Feet on Step/Stool: Able to complete >2 steps/needs minimal assist Standing Unsupported, One Foot in Front: Able to take small  step independently and hold 30 seconds Standing on One Leg: Tries to lift leg/unable to hold 3 seconds but remains standing independently Total Score: 35 Static Sitting Balance Static Sitting - Balance Support: No upper extremity supported;Feet supported Static Sitting - Level of Assistance: 5: Stand by assistance Dynamic Sitting Balance Dynamic Sitting - Balance Support: No upper extremity supported;Feet supported;During functional activity Dynamic Sitting - Level of Assistance: 4: Min assist Static Standing Balance Static Standing - Balance Support: During functional activity;No upper extremity supported Static Standing - Level of Assistance: 5: Stand by assistance Dynamic Standing Balance Dynamic Standing - Balance Support: No upper extremity supported;During functional activity Dynamic Standing - Level of Assistance: 4: Min assist Extremity Assessment   RLE Assessment RLE Assessment: Within Functional Limits LLE Assessment LLE Assessment: Exceptions to WFL(3/5 hip ms, 4/5 knee ms, 1/5 ankle ms)   See Function Navigator for Current Functional Status.   Refer to Care Plan for Long Term Goals  Recommendations for other services: None   Discharge Criteria: Patient will be discharged from PT if patient refuses treatment 3 consecutive times without medical reason, if treatment goals not met, if there is a change in medical status, if patient makes no progress towards goals or if patient is discharged from hospital.  The above assessment, treatment plan, treatment alternatives and goals were discussed and mutually agreed upon: by patient and by family  Krysteena Stalker K Arnette 02/28/2017, 10:28 AM

## 2017-02-28 NOTE — Evaluation (Signed)
Occupational Therapy Assessment and Plan  Patient Details  Name: Justin [name] MRN: [id number] Date of Birth: 1950/07/16  OT Diagnosis: hemiplegia affecting non-dominant side and muscle weakness (generalized) Rehab Potential: Rehab Potential (ACUTE ONLY): Excellent ELOS: 7-9 days   Today's Date: 02/28/2017 OT Individual Time: 1103-1200 OT Individual Time Calculation (min): 57 min     Problem List:  Patient Active Problem List   Diagnosis Date Noted  . Diabetes mellitus type 2 in nonobese (HCC)   . Left basal ganglia embolic stroke ([place]) 49/70/2637  . Gait disturbance, post-stroke   . 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 Diabetes type 2, controlled ([place]) 02/26/2017  . Acute ischemic stroke ([place]) 02/26/2017  . Dilated cardiomyopathy ([place])   . Acute on chronic combined systolic and diastolic heart failure ([place])   . Benign essential HTN   . Prediabetes   . History of traumatic brain injury   . History of CVA (cerebrovascular accident)   . 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[place])   . CVA (cerebral vascular accident) ([place]) 02/25/2017  . Medicare annual wellness visit, subsequent 04/01/2016  . Impaired fasting blood sugar 04/01/2016  . Vaccine refused by patient 04/01/2016  . Cramping of feet 04/01/2016  . History of kidney stones 02/24/2015  . Noncompliance 02/24/2015  . Hyperlipidemia 02/24/2015  . Essential hypertension 02/24/2015  . Routine general medical examination at a health care facility 02/24/2015  . Corn of foot 02/24/2015  . Paresthesia of left foot 02/24/2015  . Advanced directives, counseling/discussion 02/24/2015  . Vaccine counseling 02/24/2015  . Tobacco chew use 05/06/2011    Past Medical History:  Past Medical History:  Diagnosis Date  . Calcium oxalate renal stones   . Chewing tobacco use   . Gallstones    hx/o, s/p choleycystectomy  . H/O echocardiogram 10/2008   no wall thickness, EF 55%, mild  mitral regurge  . Hyperlipidemia   . Hypertension    hx/o, noncompliance prior with medication  . Influenza vaccine refused 09/2013  . Rheumatic fever    age 67  . Stroke ([place]) 10/02/2008  . Tinnitus    Past Surgical History:  Past Surgical History:  Procedure Laterality Date  . blood clot  1997   brain  . BRAIN SURGERY  1997   craniotomy, evacuation of hematoma  . CHOLECYSTECTOMY  09/06/2011   Procedure: LAPAROSCOPIC CHOLECYSTECTOMY WITH INTRAOPERATIVE CHOLANGIOGRAM;  Surgeon: Earnstine Regal, MD;  Location: [facility];  Service: General;  Laterality: N/A;  . COLONOSCOPY  01/04/14   normal. Dr. Owens Loffler  . Dental implant    . JOINT REPLACEMENT  2004   LEFT BICEP REPAIR, RTC repair  . LOOP RECORDER INSERTION N/A 02/27/2017   Procedure: LOOP RECORDER INSERTION;  Surgeon: Sanda Klein, MD;  Location: [facility];  Service: Cardiovascular;  Laterality: N/A;  . LOOP RECORDER INSERTION N/A 02/27/2017   Procedure: LOOP RECORDER INSERTION;  Surgeon: Pixie Casino, MD;  Location: [facility];  Service: Cardiovascular;  Laterality: N/A;  . ROTATOR CUFF REPAIR     Right  . SIGMOIDOSCOPY    . TEE WITHOUT CARDIOVERSION N/A 02/27/2017   Procedure: TRANSESOPHAGEAL ECHOCARDIOGRAM (TEE);  Surgeon: Pixie Casino, MD;  Location: [facility];  Service: Cardiovascular;  Laterality: N/A;    Assessment & Plan Clinical Impression: Patient is a 67 y.o. year old male with recent admission to the hospital on 02/25/2017 with intermittent visual disturbances and decrease in balance that have persisted since Christmas  2018. Patient did have recent cataract surgery in August and November 2018. Noted blood pressure with systolic in the 321Y. CT of the headreviewed, unremarkable for acute intracranial process.Patient did not receive TPA. MRI the brain showed patchy small-volume acute ischemic nonhemorrhagic subcentimeter infarct left basal ganglia left parietal-occipital region and right cerebellar  hemisphere. Small remote left thalamic lacunar infarction.  .  Patient transferred to CIR on 02/27/2017 .  Patient currently requires min with basic self-care skills secondary to muscle weakness, impaired timing and sequencing, unbalanced muscle activation and decreased coordination and decreased sitting balance, decreased standing balance, hemiplegia and decreased balance strategies.  Prior to hospitalization, patient could complete ADLs with independent .  Patient will benefit from skilled intervention to decrease level of assist with basic self-care skills, increase independence with basic self-care skills and increase level of independence with iADL prior to discharge home with care partner.  Anticipate patient will require intermittent supervision and follow up outpatient.  OT - End of Session Activity Tolerance: Tolerates 10 - 20 min activity with multiple rests Endurance Deficit: Yes Endurance Deficit Description: Pt reporting being really tired after completion of shower. OT Assessment Rehab Potential (ACUTE ONLY): Excellent OT Patient demonstrates impairments in the following area(s): Balance;Endurance;Motor;Vision OT Basic ADL's Functional Problem(s): Grooming;Dressing;Toileting OT Advanced ADL's Functional Problem(s): Simple Meal Preparation OT Transfers Functional Problem(s): Tub/Shower;Toilet OT Additional Impairment(s): Fuctional Use of Upper Extremity OT Plan OT Intensity: Minimum of 1-2 x/day, 45 to 90 minutes OT Frequency: 5 out of 7 days OT Duration/Estimated Length of Stay: 7-9 days OT Treatment/Interventions: Teacher, English as a foreign language;Discharge planning;Pain management;Patient/family education;Neuromuscular re-education;Self Care/advanced ADL retraining;Therapeutic Activities;UE/LE Coordination activities;Therapeutic Exercise;Visual/perceptual remediation/compensation;UE/LE Strength taining/ROM;Splinting/orthotics;Functional mobility  training;Community reintegration OT Self Feeding Anticipated Outcome(s): modified independent OT Basic Self-Care Anticipated Outcome(s): modified independent OT Toileting Anticipated Outcome(s): modified independent OT Bathroom Transfers Anticipated Outcome(s): modified independent OT Recommendation Patient destination: Home Follow Up Recommendations: Outpatient OT Equipment Recommended: Tub/shower seat   Skilled Therapeutic Intervention Pt completed selfcare retraining sit to stand this session.  He transferred to the shower with min assist and no assistive device.  Removed clothing sit to stand with min assist as well prior to showering.  He then completed most of shower in standing with min assist and use of shower bench for stability on the LEs and grab bar as well.  Therapist cued pt to sit down for washing LEs as well.  Dressing sit to stand with min assist as well as well as toileting in standing with min assist.  He was able to stand and donn button up shirt with min guard assist as well.    Pt reporting intermittent diplopia during session, but only present in the right visual field.  When present it resolved within a minute.  Will continue to monitor as occular ROM and tracking appears to be [facility] for gross testing.  Finished session with pt in bed and family in room. Call button and phone in reach.    OT Evaluation Precautions/Restrictions  Precautions Precautions: Fall Restrictions Weight Bearing Restrictions: No  Pain Pain Assessment Pain Assessment: No/denies pain Pain Score: 0-No pain Home Living/Prior Functioning Home Living Family/patient expects to be discharged to:: Private residence Living Arrangements: Spouse/significant other Available Help at Discharge: Available 24 hours/day Type of Home: House Home Access: Stairs to enter [employer] of Steps: 4 Entrance Stairs-Rails: None Home Layout: One level Bathroom Shower/Tub: Tourist information centre manager: Standard Bathroom Accessibility: Yes Additional Comments: they switch between [place] (1 level, 4 steps) and [place] home in the summer (1 level)  Lives With: Significant other IADL History Homemaking Responsibilities: Yes Meal Prep Responsibility: Secondary Current License: Yes Mode of Transportation: Other (comment)(truck) Occupation: Retired Leisure and Hobbies: Loves to bass fish, competes in tournaments Prior Function Level of Independence: Independent with basic ADLs  Able to [place]?: Yes Driving: Yes 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 Leisure: Hobbies-yes (Comment) Comments: fishing  ADL  See Function Section of chart for details  Vision Baseline Vision/History: Wears glasses Wears Glasses: Reading only Patient Visual Report: Diplopia(intermittent) Vision Assessment?: No apparent visual deficits Eye Alignment: Impaired (comment)(right eye sits sligtly superior to the left) Ocular Range of Motion: Within Functional Limits Alignment/Gaze Preference: Within Defined Limits Tracking/Visual Pursuits: Able to track stimulus in all quads without difficulty Saccades: Within functional limits Convergence: Within functional limits Visual Fields: Other (comment)(Questionable slight left visual field deficit but will monitor further in functiona treatment.) Diplopia Assessment: Disappears with one eye closed;Present in near gaze Depth Perception: (pt reports his depth perception was impaired at baseline when looking down) Additional Comments: Pt initially with no dipolopia but then appeared in the right visual field.  Disappeared again after closing eyes and resting them for a brief period.   Perception  Perception: Impaired Praxis Praxis: Intact Cognition Overall Cognitive Status: Within Functional Limits for tasks assessed Arousal/Alertness: Awake/alert Orientation Level: Person;Place;Situation Person: Oriented Place:  Oriented Situation: Oriented Year: 2018 Month: January Day of Week: Correct Memory: Appears intact Attention: Focused Focused Attention: Appears intact Sustained Attention: Appears intact Awareness: Appears intact Problem Solving: Appears intact Behaviors: Impulsive Comments: Needs cueing to sit down for washing LEs and feet as he was going to attempt to complete this in standing.   Sensation Sensation Light Touch: Appears Intact Stereognosis: Appears Intact Hot/Cold: Appears Intact Proprioception: Appears Intact Coordination Gross Motor Movements are Fluid and Coordinated: Yes Fine Motor Movements are Fluid and Coordinated: No Coordination and Movement Description: Slight FM coordination impairment in the left hand noted when attempting to remove tape from the right arm.  Finger Nose Finger Test: Slower coordination with the left hand compared to the right.  Heel Shin Test: Impaired LLE Motor  Motor Motor: Hemiplegia Motor - Skilled Clinical Observations: ataxic w/ all functional movements Mobility  Bed Mobility Bed Mobility: Rolling Right;Rolling Left;Supine to Sit Rolling Right: 5: Supervision Rolling Left: 5: Supervision Supine to Sit: 5: Supervision;HOB flat Transfers Transfers: Sit to Stand Sit to Stand: 4: Min assist;With upper extremity assist;From bed Stand to Sit: 4: Min assist;With upper extremity assist;To bed  Trunk/Postural Assessment  Cervical Assessment Cervical Assessment: Within Functional Limits Thoracic Assessment Thoracic Assessment: Within Functional Limits Lumbar Assessment Lumbar Assessment: Within Functional Limits Postural Control Postural Control: Within Functional Limits  Balance Balance Balance Assessed: Yes Standardized Balance Assessment Standardized Balance Assessment: Berg Balance Test Berg Balance Test Sit to Stand: Able to stand without using hands and stabilize independently Standing Unsupported: Able to stand 2 minutes with  supervision Sitting with Back Unsupported but Feet Supported on Floor or Stool: Able to sit safely and securely 2 minutes Stand to Sit: Sits safely with minimal use of hands Transfers: Able to transfer safely, definite need of hands Standing Unsupported with Eyes Closed: Able to stand 10 seconds safely Standing Ubsupported with Feet Together: Able to place feet together independently but unable to hold for 30 seconds From Standing, Reach Forward with Outstretched Arm: Can reach forward >5 cm safely (2") From Standing Position, Pick up Object from Floor: Able to pick up  shoe, needs supervision From Standing Position, Turn to Look Behind Over each Shoulder: Needs supervision when turning Turn 360 Degrees: Needs close supervision or verbal cueing Standing Unsupported, Alternately Place Feet on Step/Stool: Able to complete >2 steps/needs minimal assist Standing Unsupported, One Foot in Front: Able to take small step independently and hold 30 seconds Standing on One Leg: Tries to lift leg/unable to hold 3 seconds but remains standing independently Total Score: 35 Static Sitting Balance Static Sitting - Balance Support: No upper extremity supported;Feet supported Static Sitting - Level of Assistance: 5: Stand by assistance Dynamic Sitting Balance Dynamic Sitting - Balance Support: No upper extremity supported;Feet supported;During functional activity Dynamic Sitting - Level of Assistance: 4: Min assist Static Standing Balance Static Standing - Balance Support: During functional activity;No upper extremity supported Static Standing - Level of Assistance: 5: Stand by assistance Dynamic Standing Balance Dynamic Standing - Balance Support: During functional activity Dynamic Standing - Level of Assistance: 4: Min assist Extremity/Trunk Assessment RUE Assessment RUE Assessment: Within Functional Limits LUE Assessment LUE Assessment: Exceptions to [facility] WFLs with strength 4/5 throughout.  Slight  decreased FM coordination as well as finger to nose testing but uses functionally at a diminished level. )   See Function Navigator for Current Functional Status.   Refer to Care Plan for Long Term Goals  Recommendations for other services: None    Discharge Criteria: Patient will be discharged from OT if patient refuses treatment 3 consecutive times without medical reason, if treatment goals not met, if there is a change in medical status, if patient makes no progress towards goals or if patient is discharged from hospital.  The above assessment, treatment plan, treatment alternatives and goals were discussed and mutually agreed upon: by patient  Malak Duchesneau OTR/L 02/28/2017, 12:43 PM

## 2017-02-28 NOTE — Progress Notes (Signed)
NEUROHOSPITALISTS STROKE TEAM - DAILY PROGRESS NOTE   ADMISSION HISTORY:  Justin [name] is an 67 y.o. male who presented to the ED on Tuesday after being sent from his ophthalmologist's office for evaluation and management of a possible new occipitoparietal stroke. The patient presented to his ophthalmologist for evaluation of intermittent vertical binocular diplopia, which had been occurring since December. At the ophthamologist's office, visual field testing revealed a mild right homonymous inferior quadrantanopsia, which was felt by the ophthalmologist to most likely be due to a new left parietal or occipital lobe stroke.   SUBJECTIVE (INTERVAL HISTORY) Family is at the bedside. Patient is found laying in bed in NAD. Overall he feels his condition is stable. Voices no new complaints. No new events reported overnight.   OBJECTIVE Lab Results: CBC:  Recent Labs  Lab 02/25/17 1607 02/26/17 0328  WBC 5.6 5.2  HGB 15.2 14.3  HCT 44.8 41.3  MCV 85.3 84.6  PLT 214 183   BMP: Recent Labs  Lab 02/25/17 1607 02/26/17 0328  NA 137 136  K 4.0 3.6  CL 101 104  CO2 25 24  GLUCOSE 96 157*  BUN 14 12  CREATININE 0.91 0.97  CALCIUM 9.4 8.7*   Liver Function Tests:  Recent Labs  Lab 02/25/17 1607  AST 19  ALT 16*  ALKPHOS 72  BILITOT 0.6  PROT 7.2  ALBUMIN 4.0   Cardiac Enzymes:  Recent Labs  Lab 02/26/17 0328  CKTOTAL 56   Coagulation Studies:  Recent Labs    02/25/17 1607  APTT 32  INR 0.98   PHYSICAL EXAM Temp:  [97.8 F (36.6 C)-99 F (37.2 C)] 98 F (36.7 C) (01/31 1548) Pulse Rate:  [52-67] 64 (01/31 1650) Resp:  [11-18] 18 (01/31 1650) BP: (116-179)/(54-104) 129/69 (01/31 1650) SpO2:  [97 %-100 %] 98 % (01/31 1650) General - Well nourished, well developed, in no apparent distress HEENT-  Normocephalic,    Cardiovascular - Regular rate and rhythm  Respiratory - Lungs clear bilaterally. No  wheezing. Abdomen - soft and non-tender, BS normal Extremities- no edema or cyanosis Neurologic Examination: Mental Status: Alert, oriented, thought content appropriate.  Speech fluent without evidence of aphasia.  Able to follow all commands without difficulty. Cranial Nerves: II:  Visual fields showed right lower quadrantanopia. PERRL.   III,IV, VI: EOMI without nystagmus. No ptosis.  V,VII: No facial droop. Facial temp sensation normal bilaterally.  VIII: Hearing intact to conversation.  IX,X: Palate rises symmetrically XI: Symmetric XII: midline tongue extension  Motor: Right : Upper extremity   5/5    Left:     Upper extremity   5/5  Lower extremity   5/5     Lower extremity   5/5 Normal tone throughout; no atrophy noted No pronator drift. Sensory: Temp and light touch intact x 4. No extinction.  Deep Tendon Reflexes:  RUE 1+ brachioradialis and biceps LUE 2+ brachioradialis and biceps Patellae 2+ bilaterally Achilles 1+ bilaterally Toes downgoing Cerebellar: No ataxia with FNF bilaterally Gait: Deferred  IMAGING: I have personally reviewed the radiological images below and agree with the radiology interpretations.  Ct Head Wo Contrast Result Date: 02/25/2017 IMPRESSION: No acute intracranial abnormality. Electronically Signed   By: Rolm Baptise M.D.   On: 02/25/2017 17:20   Ct Angio Head/Neck W Or Wo Contrast Result Date: 02/26/2017 IMPRESSION: 1. Atherosclerosis most heavily involving the posterior circulation. 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 These stenoses lead to  under opacification of left PCA vessels when compared to the right (which is fetal type). 2. Approximately 50% left supraclinoid ICA stenosis. 3. Atherosclerosis in the neck without flow limiting stenosis or ulceration. Electronically Signed   By: Monte Fantasia M.D.   On: 02/26/2017 13:55   [name]  Result Date: 02/25/2017 IMPRESSION:  1. Patchy small volume acute ischemic nonhemorrhagic subcentimeter infarcts involving the left basal ganglia, left parieto-occipital region, and right cerebellar hemisphere as above. 2. Small remote left thalamic lacunar infarct. 3. Sequelae of prior left parietal craniotomy. Electronically Signed   By: Jeannine Boga M.D.   On: 02/25/2017 20:59   Echocardiogram:                                               Study Conclusions - Left ventricle: The cavity size was mildly dilated. Systolic   function was moderately reduced. The estimated ejection fraction   was in the range of 35% to 40%. There is akinesis of the   entireinferolateral, inferior, and inferoseptal myocardium.   Features are consistent with a pseudonormal left ventricular   filling pattern, with concomitant abnormal relaxation and   increased filling pressure (grade 2 diastolic dysfunction).   Doppler parameters are consistent with high ventricular filling   pressure. - Aortic valve: Trileaflet; mildly thickened, mildly calcified   leaflets. - Mitral valve: There was mild regurgitation. - Left atrium: The atrium was mildly dilated.  TEE /Loop Recorder:                                        02/27/2017     IMPRESSION: Justin [name] is a 67 y.o. male with PMH of HTN, HLD and Hx of CVA who presents with complaints of intermittent double vision. MRI reveals:  Patchy small volume acute ischemic nonhemorrhagic subcentimeter infarcts involving the left basal ganglia, left parieto-occipital region, and right cerebellar hemisphere  Suspected Etiology: Likely cardioembolic source Resultant Symptoms: intermittent double vision Stroke Risk Factors: diabetes mellitus, hyperlipidemia and hypertension Other Stroke Risk Factors: Advanced age, Chews tobacco, Hx stroke,  history of rheumatic fever and  History of traumatic ICH s/p craniotomy  Outstanding Stroke Work-up Studies:     TEE /Loop Recorder:                                              PENDING 02/27/2017  PLAN  02/28/2017: Continue Aspirin/ Statin Frequent neuro checks Telemetry monitoring PT/OT/SLP Consult PM & Rehab Consult Case Management /MSW TEE and Loop Recorder Placement - 02/27/2017 at 3PM Ongoing aggressive stroke risk factor management Patient counseled to be compliant with his antithrombotic medications Patient counseled on Lifestyle modifications including, Diet, Exercise, and Stress Follow up with [facility] in 6 weeks  HX OF STROKES:  Small remote left thalamic lacunar infarct, CVA in 2010 with right sided weakness that subsequently resolved with physical therapy    Traumatic ICH s/p craniotomy in 1997,  INTRACRANIAL Atherosclerosis &Stenosis: On ASA for now  R/O AFIB: TEE and Loop Recorder Placement at [facility] 02/27/2017 Cardiology Consultation for EF 35-40%  HYPERTENSION: Stable Permissive hypertension (OK if <  220/120) for 24-48 hours post stroke and then gradually normalized within 5-7 days. Long term BP goal normotensive. May slowly restart home B/P medications after 48 hours Home Meds: Norvasc, Lisinopril  HYPERLIPIDEMIA:    Component Value Date/Time   CHOL 211 (H) 02/26/2017 0328   TRIG 153 (H) 02/26/2017 0328   HDL 41 02/26/2017 0328   CHOLHDL 5.1 02/26/2017 0328   VLDL 31 02/26/2017 0328   LDLCALC 139 (H) 02/26/2017 0328  Home Meds:  NONE LDL  goal < 70 Started on Lipitor to 40 mg daily Continue statin at discharge  DIABETES: Lab Results  Component Value Date   HGBA1C 6.5 (H) 02/26/2017  HgbA1c goal < 7.0 Continue CBG monitoring and SSI to maintain glucose 140-180 mg/dl DM education   TOBACCO ABUSE Currently chews tobacco Smoking cessation counseling provided Nicotine patch provided  Other Active Problems: Principal Problem:   CVA (cerebral vascular accident) ([place]) Active Problems:   Tobacco chew use   Essential hypertension   Diabetes type 2, controlled ([place])   Acute ischemic  stroke ([place])   Dilated cardiomyopathy ([place])   Acute on chronic combined systolic and diastolic heart failure (HCC)   Benign essential HTN   Prediabetes   History of traumatic brain injury   History of CVA (cerebrovascular accident)   Brainstem stroke [facility])    Hospital day # 1 VTE prophylaxis: Lovenox  Diet : Diet - low sodium heart healthy   FAMILY UPDATES:  family at bedside  TEAM UPDATES: No att. providers found   Prior Home Stroke Medications:  aspirin 81 mg daily  Discharge Stroke Meds:  Please discharge patient on aspirin 81 mg daily , for now  Disposition: 62-Rehab Facility Therapy Recs:               PENDING Follow Up:  Follow-up Information    Dennie Bible, NP. Schedule an appointment as soon as possible for a visit in 6 week(s).   Specialty:  Family Medicine Contact information: [address] [place] [address] [phone]        Tysinger, Camelia Eng, PA-C. Schedule an appointment as soon as possible for a visit in 1 week(s).   Specialty:  Family Medicine Contact information: [place] [address] [phone]        Sanda Klein, MD Follow up.   Specialty:  Cardiology Contact information: [address] [place] [address] [phone]          Tysinger, Leward Quan -PCP Follow up in 1-2 weeks      Assessment & plan discussed with with attending physician and they are in agreement.    Renie Ora Stroke Neurology Team 02/28/2017 12:03 AM   ATTENDING NOTE: I reviewed above note and agree with the assessment and plan. I have made any additions or clarifications directly to the above note. Pt was seen and examined.   67 year old male with history of HTN, stroke in 2010, traumatic ICH status post craniotomy in 1997, HLD admitted for double vision, difficulty walking, right lower quadrantanopia and intermittent weakness.  CT no acute abnormality.  However, MRI showed left  BG, left MCA/PCA and right cerebellum patchy punctate infarcts.  Examination showed right lower quadrantanopia.  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  EF 35-40%.  LDL 139 and A1c 6.5.  Patient denies any cardiac issues in the past.  Denies palpitation. TEE unremarkable, no PFO.  Loop recorder placed. Cardiology consulted for cardiomyopathy and will arrange for cardiac cath before discharge from CIR.  Patient stated that he has been on aspirin 81 several years ago, he re-started aspirin 81 for the last several days.  He refused Plavix at this time.  Now on aspirin 325 daily and continue lipitor. Pending CIR admission.  Neurology will sign off. Please call with questions. Pt will follow up with Cecille Rubin, NP, at [facility] in about 6 weeks. Thanks for the consult.   Rosalin Hawking, MD PhD Stroke Neurology 02/28/2017 12:03 AM     To contact Stroke Continuity provider, please refer to [email or web address] After hours, contact General Neurology

## 2017-02-28 NOTE — Progress Notes (Signed)
[facility] Individual Statement of Services  Patient Name:  Justin [name]  Date:  02/28/2017  Welcome to the [facility].  Our goal is to provide you with an individualized program based on your diagnosis and situation, designed to meet your specific needs.  With this comprehensive rehabilitation program, you will be expected to participate in at least 3 hours of rehabilitation therapies Monday-Friday, with modified therapy programming on the weekends.  Your rehabilitation program will include the following services:  Physical Therapy (PT), Occupational Therapy (OT), Speech Therapy (ST), 24 hour per day rehabilitation nursing, Neuropsychology, Case Management (Social Worker), Rehabilitation Medicine, Nutrition Services and Pharmacy Services  Weekly team conferences will be held on Wednesdays to discuss your progress.  Your Social Worker will talk with you frequently to get your input and to update you on team discussions.  Team conferences with you and your family in attendance may also be held.  Expected length of stay:  7 to 10 days  Overall anticipated outcome:  Modified independent with supervision needed for standing and walking and minimal assistance for stairs  Depending on your progress and recovery, your program may change. Your Social Worker will coordinate services and will keep you informed of any changes. Your Social Worker's name and contact numbers are listed  below.  The following services may also be recommended but are not provided by the [facility] will be made to provide these services after discharge if needed.  Arrangements include referral to agencies that provide these services.  Your insurance has been verified to be:  [employer] Your primary doctor is:  Dr. Heinz Knuckles  Pertinent  information will be shared with your doctor and your insurance company.  Social Worker:  Alfonse Alpers, LCSW  [phone] or [phone]  Information discussed with and copy given to patient by: Trey Sailors, 02/28/2017, 11:11 AM

## 2017-02-28 NOTE — Evaluation (Signed)
Speech Language Pathology Assessment and Plan  Patient Details  Name: Justin [name] MRN: [id number] Date of Birth: Jan 03, 1951  SLP Diagnosis: Cognitive Impairments  Rehab Potential: Good ELOS: 7-10 days     Today's Date: 02/28/2017 SLP Individual Time: 4709-6283 SLP Individual Time Calculation (min): 25 min   Problem List:  Patient Active Problem List   Diagnosis Date Noted  . Diabetes mellitus type 2 in nonobese (HCC)   . Left basal ganglia embolic stroke ([place]) 66/29/4765  . Gait disturbance, post-stroke   . 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 Diabetes type 2, controlled ([place]) 02/26/2017  . Acute ischemic stroke ([place]) 02/26/2017  . Dilated cardiomyopathy ([place])   . Acute on chronic combined systolic and diastolic heart failure ([place])   . Benign essential HTN   . Prediabetes   . History of traumatic brain injury   . History of CVA (cerebrovascular accident)   . 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[place])   . CVA (cerebral vascular accident) ([place]) 02/25/2017  . Medicare annual wellness visit, subsequent 04/01/2016  . Impaired fasting blood sugar 04/01/2016  . Vaccine refused by patient 04/01/2016  . Cramping of feet 04/01/2016  . History of kidney stones 02/24/2015  . Noncompliance 02/24/2015  . Hyperlipidemia 02/24/2015  . Essential hypertension 02/24/2015  . Routine general medical examination at a health care facility 02/24/2015  . Corn of foot 02/24/2015  . Paresthesia of left foot 02/24/2015  . Advanced directives, counseling/discussion 02/24/2015  . Vaccine counseling 02/24/2015  . Tobacco chew use 05/06/2011   Past Medical History:  Past Medical History:  Diagnosis Date  . Calcium oxalate renal stones   . Chewing tobacco use   . Gallstones    hx/o, s/p choleycystectomy  . H/O echocardiogram 10/2008   no wall thickness, EF 55%, mild mitral regurge  . Hyperlipidemia   . Hypertension    hx/o, noncompliance  prior with medication  . Influenza vaccine refused 09/2013  . Rheumatic fever    age 67  . Stroke ([place]) 10/02/2008  . Tinnitus    Past Surgical History:  Past Surgical History:  Procedure Laterality Date  . blood clot  1997   brain  . BRAIN SURGERY  1997   craniotomy, evacuation of hematoma  . CHOLECYSTECTOMY  09/06/2011   Procedure: LAPAROSCOPIC CHOLECYSTECTOMY WITH INTRAOPERATIVE CHOLANGIOGRAM;  Surgeon: Earnstine Regal, MD;  Location: [facility];  Service: General;  Laterality: N/A;  . COLONOSCOPY  01/04/14   normal. Dr. Owens Loffler  . Dental implant    . JOINT REPLACEMENT  2004   LEFT BICEP REPAIR, RTC repair  . LOOP RECORDER INSERTION N/A 02/27/2017   Procedure: LOOP RECORDER INSERTION;  Surgeon: Sanda Klein, MD;  Location: [facility];  Service: Cardiovascular;  Laterality: N/A;  . LOOP RECORDER INSERTION N/A 02/27/2017   Procedure: LOOP RECORDER INSERTION;  Surgeon: Pixie Casino, MD;  Location: [facility];  Service: Cardiovascular;  Laterality: N/A;  . ROTATOR CUFF REPAIR     Right  . SIGMOIDOSCOPY    . TEE WITHOUT CARDIOVERSION N/A 02/27/2017   Procedure: TRANSESOPHAGEAL ECHOCARDIOGRAM (TEE);  Surgeon: Pixie Casino, MD;  Location: [facility];  Service: Cardiovascular;  Laterality: N/A;    Assessment / Plan / Recommendation Clinical Impression   Justin Orosz Denisonis a 67 y.o.right handedmalewith history of hypertension, diabetes mellitus, traumatic ICH with craniotomy and 67 as well as CVA without residualdeficit in2080mintained on aspirin 81 mg daily. Per chart review,patient, and "  Justin [name]", patientlives with spouse. One level home 4 steps to entry. Independent retired driving and active prior to admission. Presented 02/25/2017 with intermittent visual disturbances and decrease in balance that have persisted since Christmas 2018. Patient did have recent cataract surgery in August and November 2018. Noted blood pressure with systolic in the 161W. CT of the  headreviewed, unremarkable for acute intracranial process.Patient did not receive TPA. MRI the brain showed patchy small-volume acute ischemic nonhemorrhagic subcentimeter infarct left basal ganglia left parietal-occipital region and right cerebellar hemisphere. Small remote left thalamic lacunar infarction. Neurology consulted and workup currently ongoing.CT angiogram head andneck showed atherosclerosis most heavily involving the posterior circulation.approximately 50% left supraclinoid ICA stenosisEchocardiogram with ejection fraction of 96% grade 2 diastolic dysfunction.TEEwith results pendingand loop recorder placement 01/31/2019Currently maintained on aspirin for CVA prophylaxis. Subcutaneous Lovenox for DVT prophylaxis. Tolerating a regular consistency diet.Physical and occupational therapy evaluations completed and ongoing with recommendations of physical medicine rehabilitation consult. Patient was admitted for a comprehensive rehabilitation program.  SLP evaluation was completed on 02/28/2017 with the following results:  Pt presents with mild higher level cognitive deficits, specifically related to recall of new information.  Pt needed min assist verbal cues to improve accuracy on 4 word delayed recall subtest from 2/4 to 3/4.  Pt is slightly impulsive but both he and his significant other report this to be baseline.  Pt reports that is memory is not yet back to baseline and was surprised with his performance on assessment. As a result, pt would benefit from skilled ST while inpatient in order to maximize functional independence and reduce burden of care prior to discharge.  ST follow up recommendations to be determined pending progress made while inpatient.     Skilled Therapeutic Interventions          Cognitive-linguistic evaluation completed with results and recommendations reviewed with patient and family.     SLP Assessment  Patient will need skilled [place] [facility]ology Services  during CIR admission    Recommendations  Recommendations for Other Services: Neuropsych consult Patient destination: Home Follow up Recommendations: Other (comment)(TBD) Equipment Recommended: None recommended by SLP    SLP Frequency 3 to 5 out of 7 days   SLP Duration  SLP Intensity  SLP Treatment/Interventions 7-10 days   Minumum of 1-2 x/day, 30 to 90 minutes  Cognitive remediation/compensation;Cueing hierarchy;Patient/family education;Internal/external aids;Environmental controls    Pain Pain Assessment Pain Assessment: No/denies pain Pain Score: 0-No pain  Prior Functioning Cognitive/Linguistic Baseline: Within functional limits Type of Home: House  Lives With: Significant other Available Help at Discharge: Available 24 hours/day Vocation: Retired  Function:  Eating Eating                 Cognition Comprehension Comprehension assist level: Follows basic conversation/direction with no assist  Expression   Expression assist level: Expresses basic needs/ideas: With no assist  Social Interaction Social Interaction assist level: Interacts appropriately with others with medication or extra time (anti-anxiety, antidepressant).  Problem Solving Problem solving assist level: Solves basic problems with no assist  Memory Memory assist level: Recognizes or recalls 90% of the time/requires cueing < 10% of the time   Short Term Goals: Week 1: SLP Short Term Goal 1 (Week 1): STG=LTG due to ELOS   Refer to Care Plan for Long Term Goals  Recommendations for other services: Neuropsych  Discharge Criteria: Patient will be discharged from SLP if patient refuses treatment 3 consecutive times without medical reason, if treatment goals not met, if there  is a change in medical status, if patient makes no progress towards goals or if patient is discharged from hospital.  The above assessment, treatment plan, treatment alternatives and goals were discussed and mutually agreed  upon: by patient  Emilio Math 02/28/2017, 1:37 PM

## 2017-02-28 NOTE — Progress Notes (Signed)
Social Work Assessment and Plan  Patient Details  Name: Justin [name] MRN: [id number] Date of Birth: 03/21/1950  Today's Date: 02/28/2017  Problem List:  Patient Active Problem List   Diagnosis Date Noted  . Diabetes mellitus type 2 in nonobese (HCC)   . Left basal ganglia embolic stroke ([place]) 85/88/5027  . Gait disturbance, post-stroke   . 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 Diabetes type 2, controlled ([place]) 02/26/2017  . Acute ischemic stroke ([place]) 02/26/2017  . Dilated cardiomyopathy ([place])   . Acute on chronic combined systolic and diastolic heart failure ([place])   . Benign essential HTN   . Prediabetes   . History of traumatic brain injury   . History of CVA (cerebrovascular accident)   . 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[place])   . CVA (cerebral vascular accident) ([place]) 02/25/2017  . Medicare annual wellness visit, subsequent 04/01/2016  . Impaired fasting blood sugar 04/01/2016  . Vaccine refused by patient 04/01/2016  . Cramping of feet 04/01/2016  . History of kidney stones 02/24/2015  . Noncompliance 02/24/2015  . Hyperlipidemia 02/24/2015  . Essential hypertension 02/24/2015  . Routine general medical examination at a health care facility 02/24/2015  . Corn of foot 02/24/2015  . Paresthesia of left foot 02/24/2015  . Advanced directives, counseling/discussion 02/24/2015  . Vaccine counseling 02/24/2015  . Tobacco chew use 05/06/2011   Past Medical History:  Past Medical History:  Diagnosis Date  . Calcium oxalate renal stones   . Chewing tobacco use   . Gallstones    hx/o, s/p choleycystectomy  . H/O echocardiogram 10/2008   no wall thickness, EF 55%, mild mitral regurge  . Hyperlipidemia   . Hypertension    hx/o, noncompliance prior with medication  . Influenza vaccine refused 09/2013  . Rheumatic fever    age 1  . Stroke ([place]) 10/02/2008  . Tinnitus    Past Surgical History:  Past Surgical History:   Procedure Laterality Date  . blood clot  1997   brain  . BRAIN SURGERY  1997   craniotomy, evacuation of hematoma  . CHOLECYSTECTOMY  09/06/2011   Procedure: LAPAROSCOPIC CHOLECYSTECTOMY WITH INTRAOPERATIVE CHOLANGIOGRAM;  Surgeon: Earnstine Regal, MD;  Location: [facility];  Service: General;  Laterality: N/A;  . COLONOSCOPY  01/04/14   normal. Dr. Owens Loffler  . Dental implant    . JOINT REPLACEMENT  2004   LEFT BICEP REPAIR, RTC repair  . LOOP RECORDER INSERTION N/A 02/27/2017   Procedure: LOOP RECORDER INSERTION;  Surgeon: Sanda Klein, MD;  Location: [facility];  Service: Cardiovascular;  Laterality: N/A;  . LOOP RECORDER INSERTION N/A 02/27/2017   Procedure: LOOP RECORDER INSERTION;  Surgeon: Pixie Casino, MD;  Location: [facility];  Service: Cardiovascular;  Laterality: N/A;  . ROTATOR CUFF REPAIR     Right  . SIGMOIDOSCOPY    . TEE WITHOUT CARDIOVERSION N/A 02/27/2017   Procedure: TRANSESOPHAGEAL ECHOCARDIOGRAM (TEE);  Surgeon: Pixie Casino, MD;  Location: [facility];  Service: Cardiovascular;  Laterality: N/A;   Social History:  reports that  has never smoked. His smokeless tobacco use includes chew. He reports that he does not drink alcohol or use drugs.  Family / Support Systems Marital Status: Divorced How Long?: 40 years Patient Roles: Partner, Parent, Other (Comment)(brother; grandfather) Spouse/Significant Other: Shelton Silvas - significant other - [phone] Children: Emiel Kielty - son - [phone];  Zayven Powe - son - [phone] Other  Supports: brother from [place] came to visit. Anticipated Caregiver: Bobbie - S.O. Ability/Limitations of Caregiver: no limitations Caregiver Availability: 24/7 Family Dynamics: supportive relationship with [place] and other family members  Social History Preferred language: English Religion: Baptist Read: Yes Write: Yes Employment Status: Retired Date Retired/Disabled/Unemployed: 2011 Age  Retired: 40 Public relations account executive Issues: none reported Guardian/Conservator: N/A - MD has determined that pt is capable of making his own decisions.   Abuse/Neglect Abuse/Neglect Assessment Can Be Completed: Yes Physical Abuse: Denies Verbal Abuse: Denies Sexual Abuse: Denies Exploitation of patient/patient's resources: Denies Self-Neglect: Denies  Emotional Status Pt's affect, behavior and adjustment status: Pt is motivated to recover and although he gets frustrated and emotional at times, he wants to push through and get back to being independent. Recent Psychosocial Issues: Pt has been emotional following the stroke.   CSW explained that is not uncommon following a stroke.  Psychiatric History: none reported Substance Abuse History: none reported - but pt's brother and S.O. were present, so CSW will re-assess later  Patient / Family Perceptions, Expectations & Goals Pt/Family understanding of illness & functional limitations: Pt/S.O. have a good understanding of pt's condition and his limitations. Premorbid pt/family roles/activities: Pt enjoys fishing, working in the yard/mowing, being outside, spending time with S.O., watching their TV shows.  S.O. has a house at [facility] and they enjoy being there in the warmer months and in [place] in the colder months. Anticipated changes in roles/activities/participation: Pt would like to resume these as soon as he is able. Pt/family expectations/goals: Pt wants to get back to working in the yard, fishing, being outside.  Community Resources [employer] Scripts: None Premorbid Home Care/DME Agencies: None Transportation available at discharge: [place].O. Resource referrals recommended: Neuropsychology, Support group (specify)([place] [employer] [facility])  Discharge Planning Living Arrangements: Spouse/significant other Support Systems: Spouse/significant other, Children, Other relatives, Friends/neighbors Type of  Residence: Private residence Insurance Resources: Multimedia programmer (specify)([employer]) Museum/gallery curator Resources: Fish farm manager, Other (Comment)(retirement) Financial Screen Referred: No Money Management: Patient Does the patient have any problems obtaining your medications?: No Home Management: Pt and his S.O. share the household chores.  They both enjoy doing yard work. Social Work Anticipated Follow Up Needs: HH/OP Expected length of stay: 1 to 2 weeks  Clinical Impression CSW met with pt, pt's significant other Jolayne Haines), and pt's brother to introduce self and role of CSW, as well as to complete assessment.  Pt was talkative with CSW and fairly open about how he is coping with the stroke, stating his frustration with his mind wanting to improve and his body not cooperating.  He is "hoping my body will catch up to my mind."  Pt has good family support and feels ready to rehabilitate and is motivated to do so.  Pt aware that he will be here 2 weeks or less and CSW explained team conference process and that [place] will update him/S.O. next week after that meeting. Pt with no ongoing concerns/needs/questions at this time.  CSW will continue to follow and assist as needed.  Jakory Matsuo, Silvestre Mesi 02/28/2017, 12:38 PM

## 2017-02-28 NOTE — Progress Notes (Signed)
Occupational Therapy Session Note  Patient Details  Name: Justin [name] MRN: [id number] Date of Birth: 11/27/1950  Today's Date: 02/28/2017 OT Individual Time: 1350-1505 OT Individual Time Calculation (min): 75 min    Short Term Goals: Week 1:  OT Short Term Goal 1 (Week 1): STGs equal to LTGs set a modified independent level.   Skilled Therapeutic Interventions/Progress Updates:  Teacher, English as a foreign language;Discharge planning;Pain management;Patient/family education;Neuromuscular re-education;Self Care/advanced ADL retraining;Therapeutic Activities;UE/LE Coordination activities;Therapeutic Exercise;Visual/perceptual remediation/compensation;UE/LE Strength taining/ROM;Splinting/orthotics;Functional mobility training;Community reintegration   1:1 Focus on functional ambulation with and without RW- min guard with the RW and without RW hands on min A. In the gym focus on squats to retrieve an object on the floor on his left and then throw object with his left focus on gross and fine motor coordination. Transition to alternating toe tapping on 4 inch step with min to mod A with delayed balance reactions with focus on normal active movement. Transition to tall kneeling on the mat with reciprocal hip advancement while maintaining upright position. Then transition to quadruped with alternating lifting limbs focusing on core stability and balance and postural control.  Returned to the room ambulating without RW with min A.   Therapy Documentation Precautions:  Precautions Precautions: Fall Restrictions Weight Bearing Restrictions: No General:   Vital Signs: Therapy Vitals Temp: 98.2 F (36.8 C) Temp Source: Oral Pulse Rate: (!) 56 Resp: 17 BP: (!) 118/45 Patient Position (if appropriate): Lying Oxygen Therapy SpO2: 98 % O2 Device: Not Delivered Pain: Pain Assessment Pain Assessment: No/denies pain    Vision Baseline Vision/History: Wears  glasses Wears Glasses: Reading only Patient Visual Report: Diplopia(intermittent) Vision Assessment?: No apparent visual deficits Eye Alignment: Impaired (comment)(right eye sits sligtly superior to the left) Ocular Range of Motion: Within Functional Limits Tracking/Visual Pursuits: Able to track stimulus in all quads without difficulty Saccades: Within functional limits Convergence: Within functional limits Visual Fields: Other (comment)(Questionable slight left visual field deficit but will monitor further in functiona treatment.) Diplopia Assessment: Disappears with one eye closed;Present in near gaze Additional Comments: Pt initially with no dipolopia but then appeared in the right visual field.  Disappeared again after closing eyes and resting them for a brief period.   Perception  Perception: Impaired Praxis Praxis: Intact  See Function Navigator for Current Functional Status.   Therapy/Group: Individual Therapy  Willeen Cass [facility] 02/28/2017, 3:59 PM

## 2017-03-01 ENCOUNTER — Inpatient Hospital Stay (HOSPITAL_COMMUNITY): Payer: Medicare HMO | Admitting: Speech Pathology

## 2017-03-01 ENCOUNTER — Inpatient Hospital Stay (HOSPITAL_COMMUNITY): Payer: Medicare HMO | Admitting: Physical Therapy

## 2017-03-01 ENCOUNTER — Inpatient Hospital Stay (HOSPITAL_COMMUNITY): Payer: Medicare HMO | Admitting: Occupational Therapy

## 2017-03-01 LAB — GLUCOSE, CAPILLARY
GLUCOSE-CAPILLARY: 108 mg/dL — AB (ref 65–99)
Glucose-Capillary: 81 mg/dL (ref 65–99)
Glucose-Capillary: 88 mg/dL (ref 65–99)
Glucose-Capillary: 135 mg/dL — ABNORMAL HIGH (ref 65–99)

## 2017-03-01 MED ORDER — TRAZODONE HCL 50 MG PO TABS
50.0000 mg | ORAL_TABLET | Freq: Every evening | ORAL | Status: DC | PRN
  Filled 2017-03-01 (×2): qty 1

## 2017-03-01 NOTE — Progress Notes (Signed)
[place] PHYSICAL MEDICINE & REHABILITATION     PROGRESS NOTE  Subjective/Complaints:  Patient states bed is uncomfortable otherwise no complaints.  ROS: Denies CP, SOB, nausea, vomiting, diarrhea.  Objective: Vital Signs: Blood pressure 125/70, pulse 61, temperature (!) 97.4 F (36.3 C), temperature source Oral, resp. rate 16, height 5\' 6"  (1.676 m), weight 74.3 kg (163 lb 12.8 oz), SpO2 99 %. No results found. Recent Labs    02/28/17 0709  WBC 6.5  HGB 14.2  HCT 42.3  PLT 172   Recent Labs    02/28/17 0709  NA 139  K 4.2  CL 107  GLUCOSE 124*  BUN 15  CREATININE 0.89  CALCIUM 8.9   CBG (last 3)  Recent Labs    02/28/17 1622 02/28/17 2146 03/01/17 0650  GLUCAP 132* 124* 108*    Wt Readings from Last 3 Encounters:  02/27/17 74.3 kg (163 lb 12.8 oz)  02/26/17 69 kg (152 lb 1.9 oz)  04/01/16 70.7 kg (155 lb 12.8 oz)    Physical Exam:  BP 125/70   Pulse 61   Temp (!) 97.4 F (36.3 C) (Oral)   Resp 16   Ht 5\' 6"  (1.676 m)   Wt 74.3 kg (163 lb 12.8 oz)   SpO2 99%   BMI 26.44 kg/m  Gen.: Well-developed, well-nourished. NAD. Head:Normocephalic. Atraumatic Eyes:EOMare normal. No discharge.  Cardiovascular:Normal rate,regular rhythmand no JVD.  Respiratory:Effort normaland breath sounds normal. Norespiratory distress.  YQ:MVHQI sounds are normal. He exhibitsno distension. Musculoskeletal: He exhibits noedemaor tenderness. Neurological: He isalertand oriented. Followed full commands Motor: RUE/RLE: 5/5 proximal to distal LUE/LLE: 4+/5 proximal to distal Sensation intact to light touch Skin. Warm and dry Psych: Atypical affect  Assessment/Plan: 1. Functional deficits secondary to left basal ganglia, left parietal occipital region, and right cerebellar hemisphere infarcts which require 3+ hours per day of interdisciplinary therapy in a comprehensive inpatient rehab setting. Physiatrist is providing close team supervision and 24 hour  management of active medical problems listed below. Physiatrist and rehab team continue to assess barriers to discharge/monitor patient progress toward functional and medical goals.  Function:  Bathing Bathing position   Position: Standing at sink  Bathing parts Body parts bathed by patient: Right arm, Left arm, Chest, Abdomen, Front perineal area, Buttocks, Right upper leg, Left upper leg, Right lower leg, Left lower leg Body parts bathed by helper: Back  Bathing assist Assist Level: Touching or steadying assistance(Pt > 75%)      Upper Body Dressing/Undressing Upper body dressing   What is the patient wearing?: Button up shirt         Button up shirt - Perfomed by patient: Thread/unthread right sleeve, Thread/unthread left sleeve, Pull shirt around back, Button/unbutton shirt      Upper body assist Assist Level: Touching or steadying assistance(Pt > 75%)(standing)      Lower Body Dressing/Undressing Lower body dressing   What is the patient wearing?: Underwear, Pants, Shoes Underwear - Performed by patient: Thread/unthread right underwear leg, Thread/unthread left underwear leg, Pull underwear up/down   Pants- Performed by patient: Thread/unthread right pants leg, Thread/unthread left pants leg, Pull pants up/down           Shoes - Performed by patient: Don/doff right shoe, Don/doff left shoe            Lower body assist Assist for lower body dressing: Touching or steadying assistance (Pt > 75%)      Toileting Toileting   Toileting steps completed by patient: Adjust clothing  prior to toileting, Performs perineal hygiene, Adjust clothing after toileting      Toileting assist Assist level: Touching or steadying assistance (Pt.75%)   Transfers Chair/bed transfer   Chair/bed transfer method: Stand pivot, Ambulatory Chair/bed transfer assist level: Touching or steadying assistance (Pt > 75%) Chair/bed transfer assistive device: Armrests, Environmental health practitioner     Max distance: 150' Assist level: Touching or steadying assistance (Pt > 75%)   Wheelchair          Cognition Comprehension Comprehension assist level: Follows basic conversation/direction with no assist  Expression Expression assist level: Expresses basic needs/ideas: With no assist  Social Interaction Social Interaction assist level: Interacts appropriately with others with medication or extra time (anti-anxiety, antidepressant).  Problem Solving Problem solving assist level: Solves basic problems with no assist  Memory Memory assist level: Recognizes or recalls 90% of the time/requires cueing < 10% of the time    Medical Problem List and Plan: 1.Dizziness with gait disorder as well as visual disturbancesecondary to 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 Status post loop recorder.   Continue CIR PT OT speech 2. DVT Prophylaxis/Anticoagulation: Subcutaneous Lovenox. Monitor for any bleeding episodes 3. Pain Management:Tylenol as needed 4. Mood:Provide emotional support 5. Neuropsych: This patientis?fully capable of making decisions on hisown behalf. 6. Skin/Wound Care:Routine skin checks 7. Fluids/Electrolytes/Nutrition:Routine I&O's    BMP within acceptable range on 2/1 8.Hypertension. 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 Hemoglobin A1c 6.5.SSI. Check blood sugars before meals and at bedtime. Diabetic teaching    CBG (last 3)  Recent Labs    02/28/17 1622 02/28/17 2146 03/01/17 0650  GLUCAP 132* 124* 108*   12.Hyperlipidemia. Lipitor  LOS (Days) 2 A FACE TO FACE  EVALUATION WAS PERFORMED  Charlett Blake 03/01/2017 8:29 AM

## 2017-03-01 NOTE — Progress Notes (Signed)
Inpatient Diabetes Program Recommendations  AACE/ADA: New Consensus Statement on Inpatient Glycemic Control (2015)  Target Ranges:  Prepandial:   less than 140 mg/dL      Peak postprandial:   less than 180 mg/dL (1-2 hours)      Critically ill patients:  140 - 180 mg/dL   Lab Results  Component Value Date   GLUCAP 108 (H) 03/01/2017   HGBA1C 6.5 (H) 02/26/2017    Review of Glycemic Control:  Results for [name], [name] (MRN [id number]) as of 03/01/2017 11:07  Ref. Range 02/28/2017 06:51 02/28/2017 12:10 02/28/2017 16:22 02/28/2017 21:46 03/01/2017 06:50  Glucose-Capillary Latest Ref Range: 65 - 99 mg/dL 122 (H) 78 132 (H) 124 (H) 108 (H)    Diabetes history: New onset DM Outpatient Diabetes medications: None Current orders for Inpatient glycemic control: Novolog sensitive tid with meals  Inpatient Diabetes Program Recommendations:    Referral received.  Will follow-up on 03/03/17 regarding elevated A1C.   Thanks,  Adah Perl, RN, BC-ADM Inpatient Diabetes Coordinator Pager [phone] (8a-5p)

## 2017-03-01 NOTE — Progress Notes (Signed)
Physical Therapy Session Note  Patient Details  Name: Justin [name] MRN: [id number] Date of Birth: 1951/01/03  Today's Date: 03/01/2017 PT Individual Time: 0900-0955 PT Individual Time Calculation (min): 55 min   Short Term Goals: Week 1:  PT Short Term Goal 1 (Week 1): =LTGs due to ELOS  Skilled Therapeutic Interventions/Progress Updates:   Pt supine and agreeable to therapy, no c/o pain. Requesting to toilet. Wife ambulated w/ pt to/from toilet w/ min guard. Pt stood at sink for 2-3 min w/ close supervision from PT while performing hygiene. Ambulated to therapy gym using RW w/ supervision. 1 episode of feeling lightheaded, resolved w/ 1-2 min of standing rest. Worked on dynamic standing balance while in gym. Performed standing matching card game w/ emphasis on maintaining balance while squatting to reach down and reaching up, close supervision for safety. Performed object manipulation w/ tasks reaching forward while on compliant surface, min assist for balance. 1-2 LOB requiring sitting back down on mat. Performed multiple sit<>stands on compliant surface w/ min guard for safety w/ verbal cues to stabilize w/o UE support in standing. Tossed beach ball w/ 2nd helper for 2-3 min at a time both on compliant surface, w/ min assist for balance, and on firm surface w/ close supervision. Verbal cues for stepping and hip balance strategies. Manual facilitation of hip strategies on compliant surface. Performed gait training w/o AD w/ min assist overall for balance. Pt unable to hear and carryout verbal cues for gait pattern, it made gait much more unsafe as he was attempting to overcompensate. Emphasized getting used to ambulating w/o UE support and will incorporate cues for gait pattern as pt is more comfortable w/o AD. Returned to room and ended session sitting EOB in care of girlfriend, Justin [name], and all needs met. Reeducated both on needing supervision if pt was to sit EOB w/o bed alarm on. Both in agreement  to call nurses station to reset bed alarm when Bobby leaves.   Therapy Documentation Precautions:  Precautions Precautions: Fall Restrictions Weight Bearing Restrictions: No Vital Signs: Therapy Vitals Pulse Rate: 61 BP: 125/70  See Function Navigator for Current Functional Status.   Therapy/Group: Individual Therapy  Mylan Lengyel K Arnette 03/01/2017, 11:07 AM

## 2017-03-01 NOTE — Progress Notes (Signed)
Speech Language Pathology Daily Session Note  Patient Details  Name: Justin [name] MRN: [id number] Date of Birth: 1950-11-30  Today's Date: 03/01/2017 SLP Individual Time: 1530-1600 SLP Individual Time Calculation (min): 30 min  Short Term Goals: Week 1: SLP Short Term Goal 1 (Week 1): STG=LTG due to ELOS   Skilled Therapeutic Interventions: Skilled treatment session focused on cognition goals. SLP facilitated session by providing supervision questions to recall current situation and well as physical deficits. Pt required supervision questions to answer safety questions about ambulating around room/hallway with wife. Pt was left upright in bed with wife present. Continue per current plan of care.      Function:  Eating Eating                 Cognition Comprehension Comprehension assist level: Follows basic conversation/direction with no assist  Expression   Expression assist level: Expresses basic needs/ideas: With no assist  Social Interaction Social Interaction assist level: Interacts appropriately with others with medication or extra time (anti-anxiety, antidepressant).  Problem Solving Problem solving assist level: Solves basic problems with no assist  Memory Memory assist level: Recognizes or recalls 90% of the time/requires cueing < 10% of the time    Pain    Therapy/Group: Individual Therapy  [name] 03/01/2017, 4:00 PM

## 2017-03-01 NOTE — Progress Notes (Signed)
Occupational Therapy Session Note  Patient Details  Name: Justin [name] MRN: [id number] Date of Birth: 14-Feb-1950  Today's Date: 03/01/2017 OT Individual Time: 1100-1200 and 1430-1515 OT Individual Time Calculation (min): 60 min and 45 min   Short Term Goals: Week 1:  OT Short Term Goal 1 (Week 1): STGs equal to LTGs set a modified independent level.   Skilled Therapeutic Interventions/Progress Updates:    Session 1: Pt with no c/o pain this session and agreeable to OT intervention. Pt ambulating with RW and supervision 150'+ to [employer]. Pt taking seated rest break. Pt engaged in dynavision task with use of B UE's to hit targets within 2 minute time frame without use of RW. Pt able to hit 95 targets with a reaction time of 1.26 seconds overall with supervision. Pt taking seated rest break and then performing task again while standing on foam wedge to increase balance challenge. Pt demonstrating good ankle strategy and requiring 1 steady assist throughout 2 minutes. Pt hitting the exact same amount of targets in time frame. Pt with slowest time noted to be in the L superior quadrant of board both times. Pt seated at table for palmar translation task with use of L hand only requiring increased time to complete task but he was successful. Pt ambulating back to room without use of RW with close supervision - steady assistance. Pt seated in chair with lunch tray arriving and wife present in room. All needs within reach.   Session 2: Upon entering the room, pt and significant other requesting pt to shower. Pt obtained all clothing items and ambulates into bathroom with RW and close supervision with spouse. Pt very modest and requesting therapist allow wife to provide supervision and steady assistance. Therapist providing distant supervision for safety. Pt completing all tasks with overall min A level and requiring min verbal cues for safety awareness. Pt returning to sit on EOB for dressing tasks. Call bell  and all needed items within reach upon exiting the room.   Therapy Documentation Precautions:  Precautions Precautions: Fall Restrictions Weight Bearing Restrictions: No General:   Vital Signs: Therapy Vitals Pulse Rate: 61 BP: 125/70  See Function Navigator for Current Functional Status.   Therapy/Group: Individual Therapy  Gypsy Decant 03/01/2017, 12:20 PM

## 2017-03-02 ENCOUNTER — Inpatient Hospital Stay (HOSPITAL_COMMUNITY): Payer: Medicare HMO

## 2017-03-02 DIAGNOSIS — E782 Mixed hyperlipidemia: Secondary | ICD-10-CM

## 2017-03-02 LAB — GLUCOSE, CAPILLARY
GLUCOSE-CAPILLARY: 141 mg/dL — AB (ref 65–99)
GLUCOSE-CAPILLARY: 103 mg/dL — AB (ref 65–99)
Glucose-Capillary: 114 mg/dL — ABNORMAL HIGH (ref 65–99)
Glucose-Capillary: 82 mg/dL (ref 65–99)

## 2017-03-02 MED ORDER — CLOPIDOGREL BISULFATE 75 MG PO TABS
75.0000 mg | ORAL_TABLET | Freq: Every day | ORAL | Status: DC
  Administered 2017-03-02 – 2017-03-06 (×5): 75 mg via ORAL
  Filled 2017-03-02 (×5): qty 1

## 2017-03-02 MED ORDER — LIVING WELL WITH DIABETES BOOK
Freq: Once | Status: AC
  Administered 2017-03-02: 10:00:00
  Filled 2017-03-02: qty 1
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
## 2017-03-03 ENCOUNTER — Inpatient Hospital Stay (HOSPITAL_COMMUNITY): Payer: Medicare HMO

## 2017-03-03 ENCOUNTER — Encounter (HOSPITAL_COMMUNITY): Payer: Medicare HMO | Admitting: Psychology

## 2017-03-03 ENCOUNTER — Inpatient Hospital Stay (HOSPITAL_COMMUNITY): Payer: Medicare HMO | Admitting: Speech Pathology

## 2017-03-03 ENCOUNTER — Inpatient Hospital Stay (HOSPITAL_COMMUNITY): Payer: Medicare HMO | Admitting: Occupational Therapy

## 2017-03-03 LAB — CULTURE, BLOOD (ROUTINE X 2)
Culture: NO GROWTH
Culture: NO GROWTH
SPECIAL REQUESTS: ADEQUATE
Special Requests: ADEQUATE

## 2017-03-03 LAB — GLUCOSE, CAPILLARY
GLUCOSE-CAPILLARY: 72 mg/dL (ref 65–99)
GLUCOSE-CAPILLARY: 83 mg/dL (ref 65–99)
GLUCOSE-CAPILLARY: 167 mg/dL — AB (ref 65–99)
Glucose-Capillary: 113 mg/dL — ABNORMAL HIGH (ref 65–99)

## 2017-03-03 NOTE — Progress Notes (Signed)
Physical Therapy Session Note  Patient Details  Name: Justin [name] MRN: [id number] Date of Birth: December 17, 1950  Today's Date: 03/03/2017 PT Individual Time: 0902-0959 PT Individual Time Calculation (min): 57 min   Short Term Goals: Week 1:  PT Short Term Goal 1 (Week 1): =LTGs due to ELOS  Skilled Therapeutic Interventions/Progress Updates:    Pt supine in bed upon PT arrival, agreeable to therapy tx and reports back ache 2/10. Pt transferred from supine>sitting EOB with supervision and ambulated from room>gym x 150 ft using RW and supervision. Pt ambulated 2 x 100 ft without AD, contact guard. Pt worked on dynamic standing balance without UE support to perform toe taps on cones, backwards walking, side stepping, tandem walking, ball toss while side stepping, biodex limits of stability. Pt ascended/descended 4 steps without handrails, step to pattern and min assist. Pt ambulated gym<>dayroom without AD and CGA, and from gym>room without AD, CGA. Pt left seated EOB at end of session in care of girlfriend, needs in reach.   Therapy Documentation Precautions:  Precautions Precautions: Fall Restrictions Weight Bearing Restrictions: (P) No   See Function Navigator for Current Functional Status.   Therapy/Group: Individual Therapy  Netta Corrigan, PT, DPT 03/03/2017, 9:16 AM

## 2017-03-03 NOTE — Progress Notes (Signed)
Inpatient Diabetes Program Recommendations  AACE/ADA: New Consensus Statement on Inpatient Glycemic Control (2015)  Target Ranges:  Prepandial:   less than 140 mg/dL      Peak postprandial:   less than 180 mg/dL (1-2 hours)      Critically ill patients:  140 - 180 mg/dL   Lab Results  Component Value Date   GLUCAP 72 03/03/2017   HGBA1C 6.5 (H) 02/26/2017   Conversation directed toward wife the main caregiver and the one who is responsible for meals at home. Discussed A1C results (6.5% this admission) and explained what an A1C is. Discussed basic pathophysiology of DM Type 2, basic home care, importance of checking CBGs and maintaining good CBG control to prevent long-term and short-term complications. Reviewed glucose and A1C goals and explained that patient will need to continue to  Reviewed signs and symptoms of hyperglycemia and hypoglycemia along with treatment for both. Discussed impact of nutrition, exercise, stress, sickness, and medications on diabetes control. Patient will eat whole desserts at one time. Reviewed Living Well with diabetes booklet and encouraged patient to read through entire book. Informed wife that they could choose to monitor glucose levels with an over the counter meter.  RNs to provide ongoing basic DM education at bedside with this patient and engage patient to actively check blood glucose and administer insulin injections.   Thanks, Tama Headings RN, MSN, [facility] Inpatient Diabetes Coordinator Team Pager [phone] (8a-5p)

## 2017-03-03 NOTE — Progress Notes (Signed)
Occupational Therapy Session Note  Patient Details  Name: Justin [name] MRN: [id number] Date of Birth: 02-13-1950  Today's Date: 03/03/2017 OT Individual Time: 1300-1402 OT Individual Time Calculation (min): 62 min    Short Term Goals: Week 1:  OT Short Term Goal 1 (Week 1): STGs equal to LTGs set a modified independent level.   Skilled Therapeutic Interventions/Progress Updates:    Pt donned shoes and socks with setup sitting on EOB.  He then ambulated down to the therapy gym with min guard assist.  Once in the gym took a closer examination of his vision while sitting on the EOM.  He demonstrated diplopia with near vision in the upper left quadrant and the lower right quadrant.  With distant vision diplopia was present in all fields.  Pt continues to tolerate patching and alternating eyes every two hours.  Attempted adaption to safety goggles with taping but pt tends to tolerate use of the patch better per his report.  Next had pt stand on foam surface to work on static standing balance while completing head turns side to side and up and down.  No LOB noted.  He did exhibit LOB when completing head turns however when feet were placed close together.  Finished session with education on BUE therex in sitting with medium green resistance band for shoulder flexion bilaterally and elbow flexion.  Pt ambulated back to the room with min guard assist and was left sitting on the EOB with significant other present and PT coming in for next session.    Therapy Documentation Precautions:  Precautions Precautions: Fall Restrictions Weight Bearing Restrictions: No  Pain: Pain Assessment Pain Assessment: No/denies pain ADL: See Function Navigator for Current Functional Status.   Therapy/Group: Individual Therapy  Delano Scardino OTR/L 03/03/2017, 3:38 PM
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
## 2017-03-03 NOTE — Progress Notes (Signed)
[facility] PHYSICAL MEDICINE & REHABILITATION     PROGRESS NOTE  Subjective/Complaints:  PT seen lying in bed this AM.  He slept well overnight.  He had a fair weekend.  He is upset that he is not able to ambulate in the halls as he wishes.   ROS: Denies CP, SOB, nausea, vomiting, diarrhea.  Objective: Vital Signs: Blood pressure (!) 116/48, pulse (!) 57, temperature 98.4 F (36.9 C), temperature source Oral, resp. rate 18, height 5\' 6"  (1.676 m), weight 74.3 kg (163 lb 12.8 oz), SpO2 97 %. Justin [name] Wo Contrast  Result Date: 03/02/2017 CLINICAL DATA:  Intermittent visual disturbance with balance difficulty. EXAM: MRI HEAD WITHOUT CONTRAST TECHNIQUE: Multiplanar, multiecho pulse sequences of the [name] and surrounding structures were obtained without intravenous contrast. COMPARISON:  CTA head neck 02/26/2017.  Justin 02/26/2016. FINDINGS: [name]: Numerous areas of acute infarction have developed since the previous Justin. These are largely subcentimeter, and scattered throughout the posterior circulation, but in different vascular territories from prior. The largest collection is in the RIGHT lateral cerebellum, potentially representing PICA/AICA vessel involvement. Moderate-sized area of acute infarction affects the RIGHT lower paramedian pons. Punctate areas of restricted diffusion affect the LEFT cerebellum inferiorly, superiorly, and laterally, also involving the white matter. LEFT paramedian vermis acute infarct is present. LEFT posterior occipital cortical and subcortical infarct is noted. Stable involvement with acute infarction of the LEFT occipital pole new areas of involvement in the parasagittal LEFT occipital cortex. Tiny area of involvement LEFT basal ganglia is stable. Within limits for detection on Justin, these infarcts appear nonhemorrhagic. Vascular: Patent BILATERAL vertebral arteries, basilar artery, and carotid arteries, based on flow void. Skull and upper cervical spine: Sequelae of prior LEFT  parietal craniotomy. Normal skull base and upper cervical region. Sinuses/Orbits: No significant sinus or orbital pathology. Other: None. IMPRESSION: Numerous new posterior circulation infarcts are observed, significant progression compared with 02/25/2017. These involve primarily the LEFT occipital lobe, RIGHT paramedian lower pons, as well as the RIGHT greater than LEFT cerebellum. Shower of emboli is suspected. Electronically Signed   By: Staci Righter M.D.   On: 03/02/2017 13:34   No results for input(s): WBC, HGB, HCT, PLT in the last 72 hours. No results for input(s): NA, K, CL, GLUCOSE, BUN, CREATININE, CALCIUM in the last 72 hours.  Invalid input(s): CO CBG (last 3)  Recent Labs    03/02/17 1642 03/02/17 2125 03/03/17 0718  GLUCAP 141* 103* 113*    Wt Readings from Last 3 Encounters:  02/27/17 74.3 kg (163 lb 12.8 oz)  02/26/17 69 kg (152 lb 1.9 oz)  04/01/16 70.7 kg (155 lb 12.8 oz)    Physical Exam:  BP (!) 116/48 (BP Location: Right Arm)   Pulse (!) 57   Temp 98.4 F (36.9 C) (Oral)   Resp 18   Ht 5\' 6"  (1.676 m)   Wt 74.3 kg (163 lb 12.8 oz)   SpO2 97%   BMI 26.44 kg/m  Gen.: Well-developed, well-nourished. NAD. Head:Normocephalic. Atraumatic Eyes:EOMare normal. No discharge.  Cardiovascular:RRRand no JVD.  Respiratory:Effort normal breath sounds normal. Norespiratory distress.  JH:ERDEY sounds are normal. He exhibitsno distension. Musculoskeletal: He exhibits noedemaor tenderness. Neurological: He isalertand oriented. Followed full commands Motor: RUE/RLE: 5/5 proximal to distal LUE/LLE: 4+/5 proximal to distal (improving) Skin. Warm and dry Psych: Atypical affect  Assessment/Plan: 1. Functional deficits secondary to left basal ganglia, left parietal occipital region, and right cerebellar hemisphere infarcts which require 3+ hours per day of interdisciplinary therapy in a  comprehensive inpatient rehab setting. Physiatrist is providing close  team supervision and 24 hour management of active medical problems listed below. Physiatrist and rehab team continue to assess barriers to discharge/monitor patient progress toward functional and medical goals.  Function:  Bathing Bathing position   Position: Shower  Bathing parts Body parts bathed by patient: Right arm, Left arm, Chest, Abdomen, Front perineal area, Buttocks, Right upper leg, Left upper leg, Right lower leg, Left lower leg Body parts bathed by helper: Back  Bathing assist Assist Level: Touching or steadying assistance(Pt > 75%)      Upper Body Dressing/Undressing Upper body dressing   What is the patient wearing?: Pull over shirt/dress     Pull over shirt/dress - Perfomed by patient: Thread/unthread right sleeve, Thread/unthread left sleeve, Put head through opening, Pull shirt over trunk   Button up shirt - Perfomed by patient: Thread/unthread right sleeve, Thread/unthread left sleeve, Pull shirt around back, Button/unbutton shirt      Upper body assist Assist Level: Supervision or verbal cues      Lower Body Dressing/Undressing Lower body dressing   What is the patient wearing?: Underwear, Pants, Shoes, Socks Underwear - Performed by patient: Thread/unthread right underwear leg, Thread/unthread left underwear leg, Pull underwear up/down   Pants- Performed by patient: Thread/unthread right pants leg, Thread/unthread left pants leg, Pull pants up/down       Socks - Performed by patient: Don/doff right sock, Don/doff left sock   Shoes - Performed by patient: Don/doff right shoe, Don/doff left shoe, Fasten right, Fasten left            Lower body assist Assist for lower body dressing: Touching or steadying assistance (Pt > 75%)      Toileting Toileting   Toileting steps completed by patient: Adjust clothing prior to toileting, Performs perineal hygiene, Adjust clothing after toileting      Toileting assist Assist level: Touching or steadying  assistance (Pt.75%)   Transfers Chair/bed transfer   Chair/bed transfer method: Stand pivot, Ambulatory Chair/bed transfer assist level: Touching or steadying assistance (Pt > 75%) Chair/bed transfer assistive device: Armrests, Medical sales representative     Max distance: 200' Assist level: Supervision or verbal cues   Wheelchair          Cognition Comprehension Comprehension assist level: Follows basic conversation/direction with no assist  Expression Expression assist level: Expresses basic needs/ideas: With no assist  Social Interaction Social Interaction assist level: Interacts appropriately 90% of the time - Needs monitoring or encouragement for participation or interaction.  Problem Solving Problem solving assist level: Solves basic problems with no assist  Memory Memory assist level: Recognizes or recalls 90% of the time/requires cueing < 10% of the time    Medical Problem List and Plan: 1.Dizziness with gait disorder as well as visual disturbancesecondary to 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 Status post loop recorder.    Repeat MRI reviewed, over the weekend, showing b/l posterior circulation shower emboli, Plavix added by Neuro   Continue CIR    Weekend notes reviewed, images reviewed, discussed with phyiscian 2. DVT Prophylaxis/Anticoagulation: Subcutaneous Lovenox. Monitor for any bleeding episodes 3. Pain Management:Tylenol as needed 4. Mood:Provide emotional support 5. Neuropsych: This patientis?fully capable of making decisions on hisown behalf. 6. Skin/Wound Care:Routine skin checks 7. Fluids/Electrolytes/Nutrition:Routine I&O's    BMP within acceptable range on 2/1 8.Hypertension. 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 Hemoglobin A1c 6.5.SSI. Check blood sugars before meals and at bedtime. Diabetic teaching    CBG (last 3)  Recent Labs    03/02/17 1642 03/02/17 2125 03/03/17 0718  GLUCAP 141* 103* 113*    Relatively controlled 2/4 12.Hyperlipidemia. Lipitor 13.  Bradycardia no symptoms  LOS (Days) 4 A FACE TO FACE EVALUATION WAS PERFORMED  Ankit Lorie Phenix 03/03/2017 10:58 AM

## 2017-03-03 NOTE — Progress Notes (Signed)
Speech Language Pathology Daily Session Note  Patient Details  Name: Justin [name] MRN: [id number] Date of Birth: 05-21-1950  Today's Date: 03/03/2017 SLP Individual Time: 0805-0830 SLP Individual Time Calculation (min): 25 min  Short Term Goals: Week 1: SLP Short Term Goal 1 (Week 1): STG=LTG due to ELOS   Skilled Therapeutic Interventions: Skilled treatment session focused on cognitive goals. Patient independently recalled functional information in regards to current goals of therapy and safety with transfers, etc. However, patient unable to recall results from most recent MRI without assistance from wife but she reports this is baseline. Both the patient and his wife report they feel cognition has improved and is at baseline but need to continue to work on compensation for double vision.  Patient left upright in bed with wife present. Continue with current plan of care.      Function:   Cognition Comprehension Comprehension assist level: Follows basic conversation/direction with no assist  Expression   Expression assist level: Expresses basic needs/ideas: With no assist  Social Interaction Social Interaction assist level: Interacts appropriately 90% of the time - Needs monitoring or encouragement for participation or interaction.  Problem Solving Problem solving assist level: Solves basic problems with no assist  Memory Memory assist level: Recognizes or recalls 90% of the time/requires cueing < 10% of the time    Pain No/Denies Pain   Therapy/Group: Individual Therapy  Justin [name] 03/03/2017, 8:42 AM

## 2017-03-03 NOTE — Progress Notes (Signed)
Physical Therapy Note  Patient Details  Name: KRISTJAN DERNER MRN: [id number] Date of Birth: 1950-08-20 Today's Date: 03/03/2017  1410-1445, 30 min individual tx Pain: none per pt  Pt donned TEDS with minimal assistance from PT.  He stated that they were uncomfortable, but he was willing to wear them.  Gait training without AD on level tile x 120' with mild veering to R, corrected independently.  Neuromuscular re-education via forced use, positioning while standing on wedge, x 5 minutes, for sustained stretch bil heel cords, and balance challenge.  While standing on wedge, pt performed dual task of fine motor task with L/R hand, manipulating small clips. Pt accurate 100% with fine motor task, but slower with L hand.  Pt impulsively leaned over while standing on wedge, lost balance backwards and sat suddenly. Pt questioned pt about safety of this, but pt brushed it off and stated that he has always moved fast and just does whatever he wants.   Given external perturbations in standing,, pt responded with R ankle , bil hip strategies.  L ankle strategy emerged with repetition.  Advanced gait training to return to room, kicking bolster with R/L foot without LOB, but unsafely attempting to straddle feet around bolster at times.  Pt left resting in armchair with wife standing nearby. He declined getting into bed or recliner; wife aware and has been training in transfers and ambulation in room.  See function navigator for current status.  Ketan Renz 03/03/2017, 12:31 PM

## 2017-03-04 ENCOUNTER — Inpatient Hospital Stay (HOSPITAL_COMMUNITY): Payer: Medicare HMO | Admitting: Physical Therapy

## 2017-03-04 ENCOUNTER — Inpatient Hospital Stay (HOSPITAL_COMMUNITY): Payer: Medicare HMO | Admitting: Speech Pathology

## 2017-03-04 ENCOUNTER — Inpatient Hospital Stay (HOSPITAL_COMMUNITY): Payer: Medicare HMO

## 2017-03-04 ENCOUNTER — Inpatient Hospital Stay (HOSPITAL_COMMUNITY): Payer: Medicare HMO | Admitting: Occupational Therapy

## 2017-03-04 LAB — GLUCOSE, CAPILLARY
GLUCOSE-CAPILLARY: 89 mg/dL (ref 65–99)
GLUCOSE-CAPILLARY: 108 mg/dL — AB (ref 65–99)
Glucose-Capillary: 118 mg/dL — ABNORMAL HIGH (ref 65–99)
Glucose-Capillary: 139 mg/dL — ABNORMAL HIGH (ref 65–99)

## 2017-03-04 NOTE — Progress Notes (Signed)
Occupational Therapy Session Note  Patient Details  Name: AZHAR KNOPE MRN: [id number] Date of Birth: 04/22/1950  Today's Date: 03/04/2017 OT Individual Time: 1300-1401 OT Individual Time Calculation (min): 61 min    Short Term Goals: Week 1:  OT Short Term Goal 1 (Week 1): STGs equal to LTGs set a modified independent level.   Skilled Therapeutic Interventions/Progress Updates:    Pt completed functional mobility throughout session down all hallways and outside with supervision.  He demonstrated no LOB during ambulation but still reports some double vision.  When scanning left of midline, distance vision still shows diplopia in a vertical pattern.  When scanning right of midline diplopia improved where it was more a fuzzy or slightly separated object but not enough to be considered 2 objects.  Had pt work on functional mobility while retrieving items from the floor with supervision as well as use of the [employer] for balance reactions using Random Control Program.  He was able to complete 82% on easiest level and only 45% on the medium level.  Returned to room at end of session with call button and phone in reach.    Therapy Documentation Precautions:  Precautions Precautions: Fall Restrictions Weight Bearing Restrictions: No  Pain: Pain Assessment Pain Assessment: No/denies pain ADL: See Function Navigator for Current Functional Status.   Therapy/Group: Individual Therapy  Aisha Greenberger OTR/L 03/04/2017, 3:37 PM

## 2017-03-04 NOTE — Consult Note (Signed)
   [facility] Inpatient Consult   03/04/2017  ASCHER SCHROEPFER Apr 02, 1950 [id number]   [facility] Management referral received from [place] Management office.   Spoke with inpatient rehab LCSW prior to engaging Mr. Greenblatt for potential [facility] Care Management services.   Went to bedside to discuss and offer [facility] Management program. Mr. Shinault declines [place] Management follow up at this time.   Patient accepted [place] Management brochure with 24-hr nurse advice line magnet to call in future should he change his mind.  Sent notification to [place] Management office and inpatient rehab LCSW to make aware Mr. Spradley declined [place] Management services.    Marthenia Rolling, MSN-Ed, RN,BSN [facility] Liaison [phone]

## 2017-03-04 NOTE — Progress Notes (Signed)
Speech Language Pathology Discharge Summary  Patient Details  Name: Justin [name] MRN: [id number] Date of Birth: 1950/08/13  Today's Date: 03/04/2017 SLP Individual Time: 1005-1030 SLP Individual Time Calculation (min): 25 min   Skilled Therapeutic Interventions:  Pt was seen for skilled ST targeting pt and family education.  Pt and his wife continue to endorse that pt is at his baseline for cognition.  SLP reviewed memory compensatory strategies emphasizing writing information down, keeping a calendar, using a pill box and having at least initial assistance for managing medications, as well as keeping items in the same place.  Pt and wife both state that pt "is going to do what he wants to do."  Education is complete at this time.  All questions were answered to pt's and wife's satisfaction.  Pt left with wife at bedside.       Patient has met 2 of 2 long term goals.  Patient to discharge at overall Modified Independent level.  Reasons goals not met:     Clinical Impression/Discharge Summary:  Pt is discharging from [facility] services having met 2 out of 2 long term goals.  He is currently back to baseline for memory and anticipatory awareness.  Education is complete and pt is discharging home with assistance for medication management from his wife.    Care Partner:  Caregiver Able to Provide Assistance: Yes  Type of Caregiver Assistance: Physical;Cognitive  Recommendation:  None      Equipment: none recommended by SLP    Reasons for discharge: Treatment goals met   Patient/Family Agrees with Progress Made and Goals Achieved: Yes   Function:  Eating Eating               Cognition Comprehension Comprehension assist level: Follows complex conversation/direction with no assist  Expression   Expression assist level: Expresses complex ideas: With extra time/assistive device  Social Interaction Social Interaction assist level: Interacts appropriately with others - No medications  needed.  Problem Solving Problem solving assist level: Solves basic problems with no assist  Memory Memory assist level: Recognizes or recalls 90% of the time/requires cueing < 10% of the time   Windell Moulding L 03/04/2017, 8:30 PM

## 2017-03-04 NOTE — Progress Notes (Signed)
[place] PHYSICAL MEDICINE & REHABILITATION     PROGRESS NOTE  Subjective/Complaints:  Pt seen sitting up in bed this AM.  He slept well overnight.  He would like to go home.   ROS: +Diplopia. Denies CP, SOB, nausea, vomiting, diarrhea.  Objective: Vital Signs: Blood pressure (!) 133/58, pulse 60, temperature 97.7 F (36.5 C), temperature source Oral, resp. rate 18, height 5\' 6"  (1.676 m), weight 74.3 kg (163 lb 12.8 oz), SpO2 98 %. Mr Brain Wo Contrast  Result Date: 03/02/2017 CLINICAL DATA:  Intermittent visual disturbance with balance difficulty. EXAM: MRI HEAD WITHOUT CONTRAST TECHNIQUE: Multiplanar, multiecho pulse sequences of the brain and surrounding structures were obtained without intravenous contrast. COMPARISON:  CTA head neck 02/26/2017.  MR 02/26/2016. FINDINGS: Brain: Numerous areas of acute infarction have developed since the previous MR. These are largely subcentimeter, and scattered throughout the posterior circulation, but in different vascular territories from prior. The largest collection is in the RIGHT lateral cerebellum, potentially representing PICA/AICA vessel involvement. Moderate-sized area of acute infarction affects the RIGHT lower paramedian pons. Punctate areas of restricted diffusion affect the LEFT cerebellum inferiorly, superiorly, and laterally, also involving the white matter. LEFT paramedian vermis acute infarct is present. LEFT posterior occipital cortical and subcortical infarct is noted. Stable involvement with acute infarction of the LEFT occipital pole new areas of involvement in the parasagittal LEFT occipital cortex. Tiny area of involvement LEFT basal ganglia is stable. Within limits for detection on MR, these infarcts appear nonhemorrhagic. Vascular: Patent BILATERAL vertebral arteries, basilar artery, and carotid arteries, based on flow void. Skull and upper cervical spine: Sequelae of prior LEFT parietal craniotomy. Normal skull base and upper cervical  region. Sinuses/Orbits: No significant sinus or orbital pathology. Other: None. IMPRESSION: Numerous new posterior circulation infarcts are observed, significant progression compared with 02/25/2017. These involve primarily the LEFT occipital lobe, RIGHT paramedian lower pons, as well as the RIGHT greater than LEFT cerebellum. Shower of emboli is suspected. Electronically Signed   By: Staci Righter M.D.   On: 03/02/2017 13:34   No results for input(s): WBC, HGB, HCT, PLT in the last 72 hours. No results for input(s): NA, K, CL, GLUCOSE, BUN, CREATININE, CALCIUM in the last 72 hours.  Invalid input(s): CO CBG (last 3)  Recent Labs    03/03/17 1647 03/03/17 2111 03/04/17 0703  GLUCAP 83 167* 118*    Wt Readings from Last 3 Encounters:  02/27/17 74.3 kg (163 lb 12.8 oz)  02/26/17 69 kg (152 lb 1.9 oz)  04/01/16 70.7 kg (155 lb 12.8 oz)    Physical Exam:  BP (!) 133/58 (BP Location: Right Arm)   Pulse 60   Temp 97.7 F (36.5 C) (Oral)   Resp 18   Ht 5\' 6"  (1.676 m)   Wt 74.3 kg (163 lb 12.8 oz)   SpO2 98%   BMI 26.44 kg/m  Gen.: Well-developed, well-nourished. NAD. Head:Normocephalic. Atraumatic Eyes:EOMare normal. No discharge.  Cardiovascular: RRRand no JVD.  Respiratory:Effort normal breath sounds normal. Norespiratory distress.  RS:WNIOE sounds are normal. He exhibitsno distension. Musculoskeletal: He exhibits noedemaor tenderness. Neurological: He isalertand oriented. Followed full commands Motor: RUE/RLE: 5/5 proximal to distal LUE/LLE: 4+/5 proximal to distal (stable) Skin. Warm and dry Psych: Atypical affect  Assessment/Plan: 1. Functional deficits secondary to left basal ganglia, left parietal occipital region, and right cerebellar hemisphere infarcts which require 3+ hours per day of interdisciplinary therapy in a comprehensive inpatient rehab setting. Physiatrist is providing close team supervision and 24 hour management of  active medical problems  listed below. Physiatrist and rehab team continue to assess barriers to discharge/monitor patient progress toward functional and medical goals.  Function:  Bathing Bathing position   Position: Shower  Bathing parts Body parts bathed by patient: Right arm, Left arm, Chest, Abdomen, Front perineal area, Buttocks, Right upper leg, Left upper leg, Right lower leg, Left lower leg Body parts bathed by helper: Back  Bathing assist Assist Level: Touching or steadying assistance(Pt > 75%)      Upper Body Dressing/Undressing Upper body dressing   What is the patient wearing?: Pull over shirt/dress     Pull over shirt/dress - Perfomed by patient: Thread/unthread right sleeve, Thread/unthread left sleeve, Put head through opening, Pull shirt over trunk   Button up shirt - Perfomed by patient: Thread/unthread right sleeve, Thread/unthread left sleeve, Pull shirt around back, Button/unbutton shirt      Upper body assist Assist Level: Supervision or verbal cues      Lower Body Dressing/Undressing Lower body dressing   What is the patient wearing?: Underwear, Pants, Shoes, Socks Underwear - Performed by patient: Thread/unthread right underwear leg, Thread/unthread left underwear leg, Pull underwear up/down   Pants- Performed by patient: Thread/unthread right pants leg, Thread/unthread left pants leg, Pull pants up/down       Socks - Performed by patient: Don/doff right sock, Don/doff left sock   Shoes - Performed by patient: Don/doff right shoe, Don/doff left shoe, Fasten right, Fasten left            Lower body assist Assist for lower body dressing: Touching or steadying assistance (Pt > 75%)      Toileting Toileting   Toileting steps completed by patient: Adjust clothing prior to toileting, Performs perineal hygiene Toileting steps completed by helper: Adjust clothing after toileting(per Berkley Harvey, NT) Toileting Assistive Devices: Grab bar or rail  Toileting assist Assist  level: Touching or steadying assistance (Pt.75%)   Transfers Chair/bed transfer   Chair/bed transfer method: Stand pivot, Ambulatory Chair/bed transfer assist level: Supervision or verbal cues Chair/bed transfer assistive device: Armrests, Medical sales representative     Max distance: 150 ft Assist level: Touching or steadying assistance (Pt > 75%)   Wheelchair          Cognition Comprehension Comprehension assist level: Follows complex conversation/direction with no assist  Expression Expression assist level: Expresses complex ideas: With no assist  Social Interaction Social Interaction assist level: Interacts appropriately with others - No medications needed.  Problem Solving Problem solving assist level: Solves basic problems with no assist  Memory Memory assist level: Recognizes or recalls 90% of the time/requires cueing < 10% of the time    Medical Problem List and Plan: 1.Dizziness with gait disorder as well as visual disturbancesecondary to 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 Status post loop recorder.    Repeat MRI reviewed, over the weekend, showing b/l posterior circulation shower emboli, Plavix added by Neuro   Continue CIR  2. DVT Prophylaxis/Anticoagulation: Subcutaneous Lovenox. Monitor for any bleeding episodes 3. Pain Management:Tylenol as needed 4. Mood:Provide emotional support 5. Neuropsych: This patientis?fully capable of making decisions on hisown behalf. 6. Skin/Wound Care:Routine skin checks 7. Fluids/Electrolytes/Nutrition:Routine I&O's    BMP within acceptable range on 2/1 8.Hypertension. 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 Hemoglobin A1c 6.5.SSI. Check blood sugars before meals and at bedtime. Diabetic teaching    CBG (last 3)  Recent Labs    03/03/17 1647 03/03/17 2111 03/04/17 0703  GLUCAP 83 167* 118*    Relatively controlled 2/5 12.Hyperlipidemia. Lipitor 13.  Bradycardia no symptoms  LOS (Days) 5 A FACE TO FACE EVALUATION WAS PERFORMED  Ankit Lorie Phenix 03/04/2017 9:52 AM

## 2017-03-04 NOTE — Progress Notes (Signed)
Physical Therapy Session Note  Patient Details  Name: Justin [name] MRN: [id number] Date of Birth: Jan 25, 1951  Today's Date: 03/04/2017 PT Individual Time: 3382-5053 PT Individual Time Calculation (min): 57 min   Short Term Goals: Week 1:  PT Short Term Goal 1 (Week 1): =LTGs due to ELOS  Skilled Therapeutic Interventions/Progress Updates:    Pt supine in bed upon PT arrival, agreeable to therapy tx and denies pain. Pt transferred from supine>sitting with supervision and ambulated to the gym with supervision. Session focused on dynamic standing balance and ambulation on unlevel surfaces. Pt ambulated from gym>outside with supervision and worked on ambulation on incline/declines, navigating steps and walking across grass, >150 ft. Pt ambulated back to the unit with supervision. Pt worked on dynamic standing balance while standing on foam in order to perform UE activity using dynavision, x 2 trials. Pt worked on dynamic balance ambulating while bouncing ball back and forth with supervision. Pt worked on picking objects up off the ground with supervision. Pt ambulated back to room and left supine in bed with his son present.   Therapy Documentation Precautions:  Precautions Precautions: Fall Restrictions Weight Bearing Restrictions: No   See Function Navigator for Current Functional Status.   Therapy/Group: Individual Therapy  Netta Corrigan, PT, DPT 03/04/2017, 7:57 AM

## 2017-03-04 NOTE — Progress Notes (Signed)
Physical Therapy Session Note  Patient Details  Name: Justin [name] MRN: [id number] Date of Birth: [birth year]  Today's Date: 03/04/2017 PT Individual Time: 1100-1155 PT Individual Time Calculation (min): 55 min   Short Term Goals: Week 1:  PT Short Term Goal 1 (Week 1): =LTGs due to ELOS  Skilled Therapeutic Interventions/Progress Updates:   Pt in bed and agreeable to therapy, no c/o pain this session. Ambulated to gym w/ supervision and worked on dynamic standing balance on compliant surface. Close supervision for safety, no LOB w/ multiple 3-4 min bouts of standing performing ball toss and reaching tasks. Ambulated in >1000' bouts outside to work on community ambulation including stairs w/o hand rails, uneven surfaces, and distracting environment. 2 seated rest breaks 2/2 fatigue, overall spent >30 min ambulating outside in community environment. Close supervision for safety throughout, no LOB and pt demonstrating stepping, hip, and ankle strategies to maintain balance w/o verbal cues from therapist. Returned to room and ended session sitting EOB and in care of girlfriend, all needs met.   Therapy Documentation Precautions:  Precautions Precautions: Fall Restrictions Weight Bearing Restrictions: No  See Function Navigator for Current Functional Status.   Therapy/Group: Individual Therapy  [name] 03/04/2017, 12:11 PM

## 2017-03-05 ENCOUNTER — Inpatient Hospital Stay (HOSPITAL_COMMUNITY): Payer: Medicare HMO | Admitting: Occupational Therapy

## 2017-03-05 ENCOUNTER — Inpatient Hospital Stay (HOSPITAL_COMMUNITY): Payer: Medicare HMO | Admitting: Physical Therapy

## 2017-03-05 ENCOUNTER — Inpatient Hospital Stay (HOSPITAL_COMMUNITY): Payer: Medicare HMO

## 2017-03-05 DIAGNOSIS — I69319 Unspecified symptoms and signs involving cognitive functions following cerebral infarction: Secondary | ICD-10-CM

## 2017-03-05 LAB — GLUCOSE, CAPILLARY
Glucose-Capillary: 97 mg/dL (ref 65–99)
Glucose-Capillary: 94 mg/dL (ref 65–99)
Glucose-Capillary: 132 mg/dL — ABNORMAL HIGH (ref 65–99)
Glucose-Capillary: 114 mg/dL — ABNORMAL HIGH (ref 65–99)

## 2017-03-05 NOTE — Progress Notes (Signed)
Physical Therapy Discharge Summary  Patient Details  Name: Justin [name] MRN: [id number] Date of Birth: 08-Oct-1950  Today's Date: 03/05/2017 PT Individual Time: 1430-1530 PT Individual Time Calculation (min): 60 min    Patient has met 7 of 7 long term goals due to improved activity tolerance, improved balance, improved postural control, improved awareness and improved coordination.  Patient to discharge at an ambulatory level Supervision.   Patient's care partner is independent to provide the necessary supervision assistance at discharge with ambulation and stairs.  All Goals Met.   Recommendation:  Patient will benefit from ongoing skilled PT services in outpatient setting to continue to advance safe functional mobility, address ongoing impairments in balance, strength, and minimize fall risk.  Equipment: No equipment provided  Reasons for discharge: treatment goals met  Patient/family agrees with progress made and goals achieved: Yes   PT treatment interventions: Pt seated in w/c upon PT arrival, agreeable to therapy tx and denies pain. Discharge summary completed this session with focus on education, functional mobility and high level balance. Pt ambulated within the unit Mod I >200 ft and outside on unlevel surfaces with supervision >500 ft. Pt ascended/descended 12 steps with supervision, reciprocal pattern. Pt performed car transfer Mod I. Pt performed all transfers within his room via ambulation without AD, Mod I. Berg balance test completed this session as detailed below, pt scored 55/56. Pt worked on dynamic standing balance to perform gait with change in speeds and sudden stops. Pt performed all bed mobility Mod I. Pt ambulated back to room Mod I and left seated EOB with girlfriend present, needs in reach.   PT Discharge Precautions/Restrictions Precautions Precautions: Fall Restrictions Weight Bearing Restrictions: No Vital Signs Therapy Vitals Temp: 97.6 F (36.4  C) Temp Source: Oral Pulse Rate: 67 Resp: 18 BP: (!) 152/80(rn notified) Patient Position (if appropriate): Sitting Oxygen Therapy SpO2: 94 % O2 Device: Not Delivered Pain Pain Assessment Pain Assessment: No/denies pain Pain Score: 0-No pain Cognition Overall Cognitive Status: Within Functional Limits for tasks assessed Arousal/Alertness: Awake/alert Orientation Level: Oriented X4 Attention: Sustained Focused Attention: Appears intact Sustained Attention: Appears intact Awareness: Appears intact Problem Solving: Appears intact Safety/Judgment: Appears intact Sensation Sensation Light Touch: Appears Intact Stereognosis: Appears Intact Hot/Cold: Appears Intact Proprioception: Appears Intact Additional Comments: WFL B LEs Coordination Gross Motor Movements are Fluid and Coordinated: Yes Fine Motor Movements are Fluid and Coordinated: Yes Coordination and Movement Description: coordination WFL B LEs Heel Shin Test: WFL bilaterally Motor  Motor Motor: Within Functional Limits  Trunk/Postural Assessment  Cervical Assessment Cervical Assessment: Within Functional Limits Thoracic Assessment Thoracic Assessment: Within Functional Limits Lumbar Assessment Lumbar Assessment: Within Functional Limits Postural Control Postural Control: Within Functional Limits  Balance Balance Balance Assessed: Yes Standardized Balance Assessment Standardized Balance Assessment: Berg Balance Test Berg Balance Test Sit to Stand: Able to stand without using hands and stabilize independently Standing Unsupported: Able to stand safely 2 minutes Sitting with Back Unsupported but Feet Supported on Floor or Stool: Able to sit safely and securely 2 minutes Stand to Sit: Sits safely with minimal use of hands Transfers: Able to transfer safely, minor use of hands Standing Unsupported with Eyes Closed: Able to stand 10 seconds safely Standing Ubsupported with Feet Together: Able to place feet  together independently and stand 1 minute safely From Standing, Reach Forward with Outstretched Arm: Can reach confidently >25 cm (10") From Standing Position, Pick up Object from Floor: Able to pick up shoe safely and easily From Standing Position, Turn to  Look Behind Over each Shoulder: Looks behind from both sides and weight shifts well Turn 360 Degrees: Able to turn 360 degrees safely in 4 seconds or less Standing Unsupported, Alternately Place Feet on Step/Stool: Able to stand independently and safely and complete 8 steps in 20 seconds Standing Unsupported, One Foot in Front: Able to place foot tandem independently and hold 30 seconds Standing on One Leg: Able to lift leg independently and hold 5-10 seconds Total Score: 55 Static Sitting Balance Static Sitting - Level of Assistance: 6: Modified independent (Device/Increase time) Dynamic Sitting Balance Dynamic Sitting - Level of Assistance: 6: Modified independent (Device/Increase time) Static Standing Balance Static Standing - Level of Assistance: 6: Modified independent (Device/Increase time) Dynamic Standing Balance Dynamic Standing - Level of Assistance: 6: Modified independent (Device/Increase time) Extremity Assessment  RLE Assessment RLE Assessment: Within Functional Limits LLE Assessment LLE Assessment: Within Functional Limits   See Function Navigator for Current Functional Status.  Netta Corrigan, PT, DPT 03/05/2017, 2:55 PM

## 2017-03-05 NOTE — Progress Notes (Signed)
[facility] PHYSICAL MEDICINE & REHABILITATION     PROGRESS NOTE  Subjective/Complaints:  Patient seen sitting up in bed this morning. Wife at bedside. He states he would like to go home again.  ROS:  Denies CP, SOB, nausea, vomiting, diarrhea.  Objective: Vital Signs: Blood pressure 135/70, pulse 60, temperature 97.7 F (36.5 C), temperature source Oral, resp. rate 17, height 5\' 6"  (1.676 m), weight 74.3 kg (163 lb 12.8 oz), SpO2 95 %. No results found. No results for input(s): WBC, HGB, HCT, PLT in the last 72 hours. No results for input(s): NA, K, CL, GLUCOSE, BUN, CREATININE, CALCIUM in the last 72 hours.  Invalid input(s): CO CBG (last 3)  Recent Labs    03/04/17 1640 03/04/17 2048 03/05/17 0641  GLUCAP 108* 139* 97    Wt Readings from Last 3 Encounters:  02/27/17 74.3 kg (163 lb 12.8 oz)  02/26/17 69 kg (152 lb 1.9 oz)  04/01/16 70.7 kg (155 lb 12.8 oz)    Physical Exam:  BP 135/70 (BP Location: Left Arm)   Pulse 60   Temp 97.7 F (36.5 C) (Oral)   Resp 17   Ht 5\' 6"  (1.676 m)   Wt 74.3 kg (163 lb 12.8 oz)   SpO2 95%   BMI 26.44 kg/m  Gen.: Well-developed, well-nourished. NAD. Head:Normocephalic. Atraumatic Eyes:EOMare normal. No discharge.  Cardiovascular: RRRand no JVD.  Respiratory:Effort normal breath sounds normal. Norespiratory distress.  GN:FAOZH sounds are normal. He exhibitsno distension. Musculoskeletal: He exhibits noedemaor tenderness. Neurological: He isalertand oriented. Followed full commands Motor: RUE/RLE: 5/5 proximal to distal LUE/LLE: 4+/5 proximal to distal (unchanged) Skin. Warm and dry Psych: Atypical affect, persistent  Assessment/Plan: 1. Functional deficits secondary to left basal ganglia, left parietal occipital region, and right cerebellar hemisphere infarcts which require 3+ hours per day of interdisciplinary therapy in a comprehensive inpatient rehab setting. Physiatrist is providing close team supervision  and 24 hour management of active medical problems listed below. Physiatrist and rehab team continue to assess barriers to discharge/monitor patient progress toward functional and medical goals.  Function:  Bathing Bathing position   Position: Shower  Bathing parts Body parts bathed by patient: Right arm, Left arm, Chest, Abdomen, Front perineal area, Buttocks, Right upper leg, Left upper leg, Right lower leg, Left lower leg Body parts bathed by helper: Back  Bathing assist Assist Level: Touching or steadying assistance(Pt > 75%)      Upper Body Dressing/Undressing Upper body dressing   What is the patient wearing?: Pull over shirt/dress     Pull over shirt/dress - Perfomed by patient: Thread/unthread right sleeve, Thread/unthread left sleeve, Put head through opening, Pull shirt over trunk   Button up shirt - Perfomed by patient: Thread/unthread right sleeve, Thread/unthread left sleeve, Pull shirt around back, Button/unbutton shirt      Upper body assist Assist Level: Supervision or verbal cues      Lower Body Dressing/Undressing Lower body dressing   What is the patient wearing?: Underwear, Pants, Shoes, Socks Underwear - Performed by patient: Thread/unthread right underwear leg, Thread/unthread left underwear leg, Pull underwear up/down   Pants- Performed by patient: Thread/unthread right pants leg, Thread/unthread left pants leg, Pull pants up/down       Socks - Performed by patient: Don/doff right sock, Don/doff left sock   Shoes - Performed by patient: Don/doff right shoe, Don/doff left shoe, Fasten right, Fasten left            Lower body assist Assist for lower body dressing: Touching  or steadying assistance (Pt > 75%)      Toileting Toileting   Toileting steps completed by patient: Adjust clothing prior to toileting, Performs perineal hygiene Toileting steps completed by helper: Adjust clothing after toileting(per Berkley Harvey, NT) Toileting Assistive  Devices: Grab bar or rail  Toileting assist Assist level: Touching or steadying assistance (Pt.75%)   Transfers Chair/bed transfer   Chair/bed transfer method: Ambulatory Chair/bed transfer assist level: Supervision or verbal cues Chair/bed transfer assistive device: Armrests     Locomotion Ambulation     Max distance: >200 ft Assist level: Supervision or verbal cues   Wheelchair          Cognition Comprehension Comprehension assist level: Follows complex conversation/direction with no assist  Expression Expression assist level: Expresses complex ideas: With extra time/assistive device  Social Interaction Social Interaction assist level: Interacts appropriately with others - No medications needed.  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 Status post loop recorder.    Repeat MRI reviewed, over the weekend, showing b/l posterior circulation shower emboli, Plavix added by Neuro   Continue CIR    Team conference today 2. DVT Prophylaxis/Anticoagulation: Subcutaneous Lovenox. Monitor for any bleeding episodes 3. Pain Management:Tylenol as needed 4. Mood:Provide emotional support 5. Neuropsych: This patientis?fully capable of making decisions on hisown behalf. 6. Skin/Wound Care:Routine skin checks 7. Fluids/Electrolytes/Nutrition:Routine I&O's    BMP within acceptable range on 2/1 8.Hypertension. 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 Hemoglobin A1c 6.5.SSI. Check blood sugars before meals and at bedtime. Diabetic teaching    CBG (last 3)  Recent Labs    03/04/17 1640 03/04/17 2048 03/05/17 0641  GLUCAP 108* 139* 97    Relatively controlled 2/6 12.Hyperlipidemia. Lipitor 13.  Bradycardia:   Asymptomatic and stable on 2/6  LOS (Days) 6 A FACE TO FACE EVALUATION WAS PERFORMED  Justin [name] Lorie Phenix 03/05/2017 9:29 AM

## 2017-03-05 NOTE — Significant Event (Signed)
Does not want Lovenox, going home tomorrow. "I do not need it"

## 2017-03-05 NOTE — Progress Notes (Signed)
Occupational Therapy Discharge Summary  Patient Details  Name: Justin [name] MRN: [id number] Date of Birth: Jul 07, 1950  Today's Date: 03/05/2017 OT Individual Time: 1300-1403 OT Individual Time Calculation (min): 63 min    Session Note:  Had pt complete functional mobility during session.  Had him work on El[place].  He was able to pick up multiple pegs and translate from palm to fingertips without any difficulty during session.  Had him attempt to walk and toss golf ball from hand to hand.  He reported diplopia when attempting to toss and catch it.  This was not present however if he bounced the golf ball and then alternated catching it between his hands.  No diplopia reported with near or far vision when ambulating without tossing the ball.  Pt still reporting some "fuzziness" with distant vision when head is turned to the right or left putting a strain on his eyes to one side or the other.  Finished session with call button and phone in reach.  Pt made modified independent level in his room upon return as well.    Patient has met 10 of 10 long term goals due to improved activity tolerance, improved balance, ability to compensate for deficits and functional use of  LEFT upper and LEFT lower extremity.  Patient to discharge at overall Modified Independent level.  Patient's care partner is independent to provide the necessary physical assistance at discharge.    Reasons goals not met: NA  Recommendation:  Do not feel pt needs any follow-up OT at this time.  Will need to continue to monitor vision progression and possible follow-up with neuro optometrist if it does not resolve fully.    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  During session today pt reported no diplopia with near or far gaze with head at midline, however if turning head right or left to look down the hallway objects were not clear and as he stated "fuzzy".  When attempting to toss a golf ball from hand to hand and catch it during moblity, he reported diplopia, but it was not present if he bounced the ball to the floor and then caught it.  Perception  Perception: Within Functional Limits Praxis Praxis: Intact Cognition Overall Cognitive Status: Within Functional Limits for tasks assessed Arousal/Alertness: Awake/alert Orientation Level: Oriented X4 Attention: Sustained Focused Attention: Appears intact Sustained Attention: Appears intact Memory: Appears intact Awareness: Appears intact Problem Solving: Appears intact Safety/Judgment: Appears intact Sensation Sensation Light Touch: Appears Intact Stereognosis: Appears Intact Hot/Cold: Appears Intact Proprioception: Appears Intact Additional Comments: WFL B LEs Coordination Gross Motor Movements are Fluid and Coordinated: Yes Fine Motor Movements are Fluid and Coordinated: Yes Coordination and Movement Description: coordination WFL B LEs Heel Vernard Gambles  Test: [facility] bilaterally Motor  Motor Motor: Within Functional Limits Mobility    See Function Section of chart for details  Trunk/Postural Assessment  Cervical Assessment Cervical Assessment: Within Functional Limits Thoracic Assessment Thoracic Assessment: Within Functional Limits Lumbar Assessment Lumbar Assessment: Within Functional Limits Postural Control Postural Control: Within Functional Limits  Balance Balance Balance Assessed: Yes Standardized Balance Assessment Standardized Balance Assessment: Berg Balance Test Berg Balance Test Sit to Stand: Able to stand without using hands and stabilize independently Standing Unsupported: Able to stand safely 2 minutes Sitting with Back Unsupported but Feet Supported on Floor or Stool: Able to sit safely and securely 2 minutes Stand to Sit: Sits safely with minimal use of hands Transfers: Able to transfer safely, minor use of hands Standing Unsupported with Eyes Closed: Able to stand 10 seconds safely Standing Ubsupported with Feet Together: Able to place feet together independently and stand 1 minute safely From Standing, Reach Forward with Outstretched Arm: Can reach confidently >25 cm (10") From Standing Position, Pick up Object from Floor: Able to pick up shoe safely and easily From Standing Position, Turn to Look Behind Over each Shoulder: Looks behind from both sides and weight shifts well Turn 360 Degrees: Able to turn 360 degrees safely in 4 seconds or less Standing Unsupported, Alternately Place Feet on Step/Stool: Able to stand independently and safely and complete 8 steps in 20 seconds Standing Unsupported, One Foot in Front: Able to place foot tandem independently and hold 30 seconds Standing on One Leg: Able to lift leg independently and hold 5-10 seconds Total Score: 55 Static Sitting Balance Static Sitting - Balance Support: No upper extremity supported;Feet supported Static Sitting - Level of Assistance: 7:  Independent Dynamic Sitting Balance Dynamic Sitting - Balance Support: No upper extremity supported;Feet supported;During functional activity Dynamic Sitting - Level of Assistance: 7: Independent Static Standing Balance Static Standing - Balance Support: During functional activity;No upper extremity supported Static Standing - Level of Assistance: 6: Modified independent (Device/Increase time) Dynamic Standing Balance Dynamic Standing - Balance Support: During functional activity Dynamic Standing - Level of Assistance: 6: Modified independent (Device/Increase time) Extremity/Trunk Assessment RUE Assessment RUE Assessment: Within Functional Limits LUE Assessment LUE Assessment: Within Functional Limits   See Function Navigator for Current Functional Status.  Ronzell Laban OTR/L 03/05/2017, 4:21 PM

## 2017-03-05 NOTE — Discharge Summary (Signed)
Discharge summary job # [id number]

## 2017-03-05 NOTE — Progress Notes (Signed)
Occupational Therapy Session Note  Patient Details  Name: Justin [name] MRN: [id number] Date of Birth: August 06, 1950  Today's Date: 03/05/2017 OT Individual Time: 0930-1030 OT Individual Time Calculation (min): 60 min    Short Term Goals: Week 1:  OT Short Term Goal 1 (Week 1): STGs equal to LTGs set a modified independent level.   Skilled Therapeutic Interventions/Progress Updates:    Pt given multi step verbal directions of how to locate gift shop on 1st floor. Pt following directions with increased time but no further cues given. Pt ambulating without use of AD at mod I level. Pt picking up items on shelves, navigating aisles, and asking questions about merchandise as needed at overall mod I level. Pt ambulating up two flights of stairs with use of L hand rail at mod I level and returning back to room. Pt seated in recliner chair with call bell and all needed items within reach upon exiting the room.   Therapy Documentation Precautions:  Precautions Precautions: Fall Restrictions Weight Bearing Restrictions: No General:   Vital Signs:   Pain: Pain Assessment Pain Assessment: No/denies pain Pain Score: 0-No pain  See Function Navigator for Current Functional Status.   Therapy/Group: Individual Therapy  Gypsy Decant 03/05/2017, 12:52 PM

## 2017-03-05 NOTE — Progress Notes (Signed)
Physical Therapy Session Note  Patient Details  Name: Justin [name] MRN: [id number] Date of Birth: March 16, 1950  Today's Date: 03/05/2017 PT Individual Time: 9407-6808 PT Individual Time Calculation (min): 15 min   Short Term Goals: Week 1:  PT Short Term Goal 1 (Week 1): =LTGs due to ELOS  Skilled Therapeutic Interventions/Progress Updates:    no c/o pain but reports fatigue.  Reports he doesn't feel up for doing much but agreeable to attempt floor transfer in therapy gym.  Pt ambulates throughout unit mod I.  Completes floor transfer and summersault on floor mat mod I.  PT discussed falls recovery and pt able to teach back.  Returned to room and positioned with call bell in reach and needs met.    Therapy Documentation Precautions:  Precautions Precautions: None Restrictions Weight Bearing Restrictions: No General: PT Amount of Missed Time (min): 15 Minutes   See Function Navigator for Current Functional Status.   Therapy/Group: Individual Therapy  Michel Santee 03/05/2017, 4:47 PM

## 2017-03-06 ENCOUNTER — Inpatient Hospital Stay (HOSPITAL_COMMUNITY): Payer: Medicare HMO | Admitting: Occupational Therapy

## 2017-03-06 ENCOUNTER — Inpatient Hospital Stay (HOSPITAL_COMMUNITY): Payer: Medicare HMO | Admitting: Physical Therapy

## 2017-03-06 LAB — CREATININE, SERUM: Creatinine, Ser: 0.97 mg/dL (ref 0.61–1.24)

## 2017-03-06 LAB — GLUCOSE, CAPILLARY: GLUCOSE-CAPILLARY: 106 mg/dL — AB (ref 65–99)

## 2017-03-06 MED ORDER — LISINOPRIL 20 MG PO TABS: 20.0000 mg | ORAL_TABLET | Freq: Every day | ORAL | 1 refills | Status: DC

## 2017-03-06 MED ORDER — ATORVASTATIN CALCIUM 40 MG PO TABS: 40.0000 mg | ORAL_TABLET | Freq: Every day | ORAL | 0 refills | Status: DC

## 2017-03-06 MED ORDER — CLOPIDOGREL BISULFATE 75 MG PO TABS: 75.0000 mg | ORAL_TABLET | Freq: Every day | ORAL | 0 refills | Status: DC

## 2017-03-06 MED ORDER — CARVEDILOL 3.125 MG PO TABS: 3.1250 mg | ORAL_TABLET | Freq: Two times a day (BID) | ORAL | 0 refills | Status: DC

## 2017-03-06 NOTE — Patient Care Conference (Signed)
Inpatient RehabilitationTeam Conference and Plan of Care Update Date: 03/05/2017   Time: 2:05 PM    Patient Name: DEMETRI GOSHERT      Medical Record Number: [id number]  Date of Birth: 07-Jan-1951 Sex: Male         Room/Bed: 4M11C/4M11C-01 Payor Info: Payor: [employer] MEDICARE / Plan: [employer] MEDICARE HMO / Product Type: *No Product type* /    Admitting Diagnosis: CVA  Admit Date/Time:  02/27/2017  9:00 PM Admission Comments: No comment available   Primary Diagnosis:  <principal problem not specified> Principal Problem: <principal problem not specified>  Patient Active Problem List   Diagnosis Date Noted  . Cognitive deficit, post-stroke   . Diabetes mellitus type 2 in nonobese (HCC)   . Left basal ganglia embolic stroke ([place]) 27/78/2423  . Gait disturbance, post-stroke   . 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 Diabetes type 2, controlled ([place]) 02/26/2017  . Acute ischemic stroke ([place]) 02/26/2017  . Dilated cardiomyopathy ([place])   . Acute on chronic combined systolic and diastolic heart failure ([place])   . Benign essential HTN   . Prediabetes   . History of traumatic brain injury   . History of CVA (cerebrovascular accident)   . 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[place])   . CVA (cerebral vascular accident) ([place]) 02/25/2017  . Medicare annual wellness visit, subsequent 04/01/2016  . Impaired fasting blood sugar 04/01/2016  . Vaccine refused by patient 04/01/2016  . Cramping of feet 04/01/2016  . History of kidney stones 02/24/2015  . Noncompliance 02/24/2015  . Hyperlipidemia 02/24/2015  . Essential hypertension 02/24/2015  . Routine general medical examination at a health care facility 02/24/2015  . Corn of foot 02/24/2015  . Paresthesia of left foot 02/24/2015  . Advanced directives, counseling/discussion 02/24/2015  . Vaccine counseling 02/24/2015  . Tobacco chew use 05/06/2011    Expected Discharge Date: Expected Discharge Date:  03/06/17  Team Members Present: Physician leading conference: Dr. Delice Lesch Social Worker Present: Alfonse Alpers, LCSW Nurse Present: Junius Creamer, RN PT Present: Michaelene Song, PT OT Present: Clyda Greener, OT SLP Present: Windell Moulding, SLP PPS Coordinator present : Daiva Nakayama, RN, CRRN     Current Status/Progress Goal Weekly Team Focus  Medical    Dizziness with gait disorder as well as visual disturbance and cognitive deficits secondary to 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   Improve mobility, HTN/DM  See above   Bowel/Bladder   continent of b/b LBM per RN 03/04/17  remain free of constipation and continent of b/b  toilet q2h and prn laxative prn   Swallow/Nutrition/ Hydration             ADL's   Pt completes ADLs at supervision to modified independent level.  Transfers are at a modified independent level as well.  modified independent level  neuromuscular re-education, selfcare retraining, balance retraining, transfer training, visual compensation techniques, pt/family education   Mobility   supervision overall for gait w/o AD and stairs, occasional min assist for higher level balance, ambulating community distances  supervision to Mod I   discharge planning, balance, caregiver education   Communication             Safety/Cognition/ Behavioral Observations  mod I   mod I   education complete, goals met    Pain   pt denies any pain   pain will be <=2 /10 tylenol prn  assess pain q4h and  prn medicate as ordered and monitor for effectivenss   Skin   pt has surgical incision on chest with adhesive strips   skin will remain free from breakdown surgical incision will be freee of infection  monitor skin and wound qs and prn    Rehab Goals Patient on target to meet rehab goals: Yes Rehab Goals Revised: none *See Care Plan and progress notes for long and short-term goals.     Barriers to Discharge  Current  Status/Progress Possible Resolutions Date Resolved   Physician    Medical stability     See above  Therapies, optimize DM/BP meds, good progress      Nursing                  PT  Inaccessible home environment;Behavior  restless/impulsive, stairs to enter home w/o rails               OT                  SLP                SW                Discharge Planning/Teaching Needs:  Pt to return home with his girlfriend Jolayne Haines) of 10 years to provide supervision level of care.  Jolayne Haines has been at pt's bedside and knows what pt needs at d/c.   Team Discussion:  Pt with high blood but no other active issues.  He wants to go home and therapies feel he is ready for d/c.  Pt is mod A for ADLs and self care.  Vision is fuzzy to left and right, but fine straight ahead.  Pt is mod I with gait and no AD.  Supervision for stairs since there are no rails - Bobbie aware.  Pt is was d/c'd from speech 03-04-17.  Revisions to Treatment Plan:  none    Continued Need for Acute Rehabilitation Level of Care: The patient requires daily medical management by a physician with specialized training in physical medicine and rehabilitation for the following conditions: Daily direction of a multidisciplinary physical rehabilitation program to ensure safe treatment while eliciting the highest outcome that is of practical value to the patient.: Yes Daily medical management of patient stability for increased activity during participation in an intensive rehabilitation regime.: Yes Daily analysis of laboratory values and/or radiology reports with any subsequent need for medication adjustment of medical intervention for : Neurological problems;Blood pressure problems;Diabetes problems  Prevatt, Silvestre Mesi 03/06/2017, 4:28 PM

## 2017-03-06 NOTE — Discharge Summary (Signed)
NAMEMENA, LIENAU NO.:  [id number]  MEDICAL RECORD NO.:  [id number]  LOCATION:  5H74B                        FACILITY:  [facility]  PHYSICIAN:  Delice Lesch, MD        DATE OF BIRTH:  12-21-1950  DATE OF ADMISSION:  02/27/2017 DATE OF DISCHARGE:  03/06/2017                              DISCHARGE SUMMARY   DISCHARGE DIAGNOSES: 1. Left basal ganglia, left parieto-occipital region, and right     cerebellar hemispheric infarctions. 2. Subcutaneous Lovenox for deep venous thrombosis prophylaxis. 3. Hypertension. 4. History of traumatic intracerebral hemorrhage in 1997. 5. History of CVA in 2010. 6. Diet-controlled diabetes mellitus.  HISTORY OF PRESENT ILLNESS:  This is a 67 year old right-handed male with history of hypertension, diabetes mellitus, and traumatic ICH with craniotomy in 1997, as well as CVA without residual deficit and maintained on aspirin.  He lives with spouse in a 1-level home, was independent prior to admission.  He presented on February 25, 2017 with intermittent visual disturbances and decrease in balance since Christmas of 2018.  He did have a recent cataract surgery.  Noted blood pressure with diastolic pressures in the 120s.  CT of the head was unremarkable. The patient did not receive tPA.  MRI of the brain showed patchy small- volume acute ischemic non-hemorrhagic subcentimeter infarcts in left basal ganglia, left parietal occipital region, and the right cerebellar hemisphere.  Small remote left thalamic lacunar infarction.  Neurology was consulted.  CT angio of head and neck showed atherosclerotic changes with approximately 50% left supraclinoid internal carotid artery stenosis.  Echocardiogram with ejection fraction of 63%, grade 2 diastolic dysfunction.  The patient underwent loop recorder placement. Maintained on aspirin for CVA prophylaxis and subcutaneous Lovenox for deep venous thrombosis prophylaxis.  Physical and occupational  therapy are ongoing.  The patient was admitted for a comprehensive rehabilitation program.  PAST MEDICAL HISTORY:  See discharge diagnoses.  SOCIAL HISTORY:  He lives with spouse, was independent prior to admission.  Functional Status:  Upon admission to [place] was moderate assist to 18 feet with one-person handheld assist, minimal assist sit-to-stand, and moderate-to-maximum assist with activities of daily living.  PHYSICAL EXAMINATION:  VITAL SIGNS:  Blood pressure of 136/70, pulse of 52, temperature of 99, and respirations of 18. GENERAL:  An alert male, in no acute distress. EYES:  EOMs are intact. NECK:  Supple.  Nontender.  No JVD. CARDIAC:  Rate controlled. GASTROINTESTINAL:  Abdomen is soft and nontender.  Good bowel sounds. PULMONARY:  Lungs are clear to auscultation without wheeze. NEUROLOGICAL:  Motor strength is 5/5 in proximal to distal right upper and lower extremities and 4+/5 in left upper and left lower extremities.  REHABILITATION HOSPITAL COURSE:  The patient was admitted to [facility] with therapies initiated on a 3-hour daily basis consisting of physical therapy, occupational therapy, and rehabilitation nursing as well as speech therapy.  The following issues were addressed during the patient's rehabilitation stay.  Pertaining to Mr. Haselton: Left basal ganglia, left parieto-occipital region, and right cerebellar CVA remained stable.  He remained on aspirin and Plavix therapy.  He had undergone placement of loop recorder.  Subcutaneous Lovenox for deep venous thrombosis  prophylaxis.  No bleeding episodes.  Blood pressures are controlled on Coreg as well as lisinopril.  Noted history of traumatic ICH with craniotomy in 1997 as well as CVA in 2010 without residual weakness.  Diabetes mellitus with hemoglobin A1c of 6.5, diet- controlled management.  He would continue Lipitor for hyperlipidemia. The patient received weekly  collaborative interdisciplinary team conferences to discuss estimated length of stay, family teaching, and any barriers to his discharge.  He ambulates throughout the unit modified independent.  He completes floor transfers, modified independent.  He could gather belongings for activities of daily living and homemaking, working with energy conservation techniques, and ambulates in his room without the use of an assistive device.  Speech Therapy followup for any memory compensatory strategy.  Wife felt that patient was at baseline.  He was using a pill box for his medications. Full family teaching was completed and plan was discharge to home.  DISCHARGE MEDICATIONS:  Included 1. Aspirin 325 mg p.o. daily. 2. Lipitor 40 mg p.o. daily. 3. Coreg 3.125 mg p.o. b.i.d. 4. Plavix 75 mg p.o. daily. 5. Lisinopril 20 mg p.o. daily. 6. Tylenol as needed.  DISCHARGE DIET:  His diet was a regular consistency.  DISCHARGE FOLLOWUP:  The patient would follow up with Dr. Delice Lesch at the Outpatient Rehabilitation Service office as directed; Dr. [name] of Neurology Services, call for appointment; Dr. Glade Lloyd of Cardiology Services; and Dr. Heinz Knuckles of [facility].  SPECIAL INSTRUCTIONS:  No driving.     Lauraine Rinne, P.A.   ______________________________ Delice Lesch, MD    DA/MEDQ  D:  03/05/2017  T:  03/06/2017  Job:  [id number]  cc:   Delice Lesch, MD Chana Bode, PA [address] Dr. Sunday Shams DR. XU

## 2017-03-06 NOTE — Progress Notes (Signed)
Patient being discharged to home at 1000. Discharge instructions given by Silvestre Mesi, PA with verbal understanding. Belongings with patient.

## 2017-03-06 NOTE — Progress Notes (Signed)
[facility] PHYSICAL MEDICINE & REHABILITATION     PROGRESS NOTE  Subjective/Complaints:  Pt seen sitting up in his chair this AM, attempting to tie his shoes.  He slept well overnight.  He is happy about being d/ced today.  ROS:  Denies CP, SOB, nausea, vomiting, diarrhea.  Objective: Vital Signs: Blood pressure 138/63, pulse (!) 58, temperature 97.7 F (36.5 C), temperature source Oral, resp. rate 18, height 5\' 6"  (1.676 m), weight 74.3 kg (163 lb 12.8 oz), SpO2 98 %. No results found. No results for input(s): WBC, HGB, HCT, PLT in the last 72 hours. Recent Labs    03/06/17 0512  CREATININE 0.97   CBG (last 3)  Recent Labs    03/05/17 1642 03/05/17 2058 03/06/17 0654  GLUCAP 132* 114* 106*    Wt Readings from Last 3 Encounters:  02/27/17 74.3 kg (163 lb 12.8 oz)  02/26/17 69 kg (152 lb 1.9 oz)  04/01/16 70.7 kg (155 lb 12.8 oz)    Physical Exam:  BP 138/63 (BP Location: Left Arm)   Pulse (!) 58   Temp 97.7 F (36.5 C) (Oral)   Resp 18   Ht 5\' 6"  (1.676 m)   Wt 74.3 kg (163 lb 12.8 oz)   SpO2 98%   BMI 26.44 kg/m  Gen.: Well-developed, well-nourished. NAD. Head:Normocephalic. Atraumatic Eyes:EOMare normal. No discharge.  Cardiovascular: RRRand no JVD.  Respiratory:Effort normal breath sounds normal. Norespiratory distress.  JJ:KKXFG sounds are normal. He exhibitsno distension. Musculoskeletal: He exhibits noedemaor tenderness. Neurological: He isalertand oriented. Followed full commands Motor: RUE/RLE: 5/5 proximal to distal LUE/LLE: 4+/5 proximal to distal (stable) Skin. Warm and dry Psych: Atypical affect, persistent  Assessment/Plan: 1. Functional deficits secondary to left basal ganglia, left parietal occipital region, and right cerebellar hemisphere infarcts which require 3+ hours per day of interdisciplinary therapy in a comprehensive inpatient rehab setting. Physiatrist is providing close team supervision and 24 hour management of  active medical problems listed below. Physiatrist and rehab team continue to assess barriers to discharge/monitor patient progress toward functional and medical goals.  Function:  Bathing Bathing position   Position: Shower  Bathing parts Body parts bathed by patient: Right arm, Left arm, Chest, Abdomen, Front perineal area, Buttocks, Right upper leg, Left upper leg, Right lower leg, Left lower leg, Back Body parts bathed by helper: Back  Bathing assist Assist Level: More than reasonable time      Upper Body Dressing/Undressing Upper body dressing   What is the patient wearing?: Pull over shirt/dress, Button up shirt     Pull over shirt/dress - Perfomed by patient: Thread/unthread right sleeve, Thread/unthread left sleeve, Put head through opening, Pull shirt over trunk   Button up shirt - Perfomed by patient: Thread/unthread right sleeve, Thread/unthread left sleeve, Pull shirt around back, Button/unbutton shirt      Upper body assist Assist Level: More than reasonable time      Lower Body Dressing/Undressing Lower body dressing   What is the patient wearing?: Underwear, Pants, Shoes, Socks Underwear - Performed by patient: Thread/unthread right underwear leg, Thread/unthread left underwear leg, Pull underwear up/down   Pants- Performed by patient: Thread/unthread right pants leg, Thread/unthread left pants leg, Pull pants up/down       Socks - Performed by patient: Don/doff right sock, Don/doff left sock   Shoes - Performed by patient: Don/doff right shoe, Don/doff left shoe, Fasten right, Fasten left            Lower body assist Assist for lower  body dressing: More than reasonable time      Toileting Toileting   Toileting steps completed by patient: Adjust clothing prior to toileting, Performs perineal hygiene Toileting steps completed by helper: Adjust clothing after toileting(per Berkley Harvey, NT) Toileting Assistive Devices: Grab bar or rail  Toileting assist  Assist level: More than reasonable time   Transfers Chair/bed transfer   Chair/bed transfer method: Ambulatory, Stand pivot Chair/bed transfer assist level: No Help, no cues, assistive device, takes more than a reasonable amount of time Chair/bed transfer assistive device: Armrests     Locomotion Ambulation     Max distance: >500 ft Assist level: No help, No cues, assistive device, takes more than a reasonable amount of time   Wheelchair          Cognition Comprehension Comprehension assist level: Follows complex conversation/direction with no assist  Expression Expression assist level: Expresses complex ideas: With extra time/assistive device  Social Interaction Social Interaction assist level: Interacts appropriately with others - No medications needed.  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 Status post loop recorder.    Repeat MRI reviewed, over the weekend, showing b/l posterior circulation shower emboli, Plavix added by Neuro   Improving, d/c today  Will see patient for transitional care management in 1-2 weeks 2. DVT Prophylaxis/Anticoagulation: Subcutaneous Lovenox. Monitor for any bleeding episodes 3. Pain Management:Tylenol as needed 4. Mood:Provide emotional support 5. Neuropsych: This patientis?fully capable of making decisions on hisown behalf. 6. Skin/Wound Care:Routine skin checks 7. Fluids/Electrolytes/Nutrition:Routine I&O's    BMP within acceptable range on 2/1   Cr WNL on 2/7 8.Hypertension. Coreg 3.125 mg twice a day, Lisinopril 20 mg daily    Vitals:   03/05/17 1400 03/06/17 0542   BP: (!) 152/80 138/63  Pulse: 67 (!) 58  Resp: 18 18  Temp: 97.6 F (36.4 C) 97.7 F (36.5 C)  SpO2: 94% 98%    Relatively controlled to 2/7 9.History of traumatic ICH is craniotomy in 1997 10.History of CVA 2010 without residual weakness 11.Diet controlled diabetes mellitus. Hemoglobin A1c 6.5.SSI. Check blood sugars before meals and at bedtime. Diabetic teaching    CBG (last 3)  Recent Labs    03/05/17 1642 03/05/17 2058 03/06/17 0654  GLUCAP 132* 114* 106*    Relatively controlled 2/7 12.Hyperlipidemia. Lipitor 13.  Bradycardia:   Asymptomatic and stable on 2/7  LOS (Days) 7 A FACE TO FACE EVALUATION WAS PERFORMED  Justin [name] Lorie Phenix 03/06/2017 9:53 AM

## 2017-03-06 NOTE — Discharge Instructions (Signed)
Inpatient Rehab Discharge Instructions  [place] Discharge date and time: No discharge date for patient encounter.   Activities/Precautions/ Functional Status: Activity: activity as tolerated Diet: regular diet Wound Care: none needed Functional status:  ___ No restrictions     ___ Walk up steps independently ___ 24/7 supervision/assistance   ___ Walk up steps with assistance ___ Intermittent supervision/assistance  ___ Bathe/dress independently ___ Walk with walker     _x__ Bathe/dress with assistance ___ Walk Independently    ___ Shower independently ___ Walk with assistance    ___ Shower with assistance ___ No alcohol     ___ Return to work/school ________  COMMUNITY REFERRALS UPON DISCHARGE:   Outpatient: PT  Agency:  [facility]                            [address], [place]                            [place], [place]  [address]  Phone:  [phone]   Appointment Date/Time:  Monday, February 11th at [place]  (arrive at 7:45AM and bring insurance card)  Medical Equipment/Items Ordered:  None needed  Support Group:  [facility] Stroke Support Group                               Meets the 2nd Thursday of each month (except for June, July, and August) from 3-4 PM                               In the dayroom of Inpatient Rehab at [facility], [facility]                               For more information, please call [phone]   Special Instructions: NO DRIVING STROKE/TIA DISCHARGE INSTRUCTIONS SMOKING Cigarette smoking nearly doubles your risk of having a stroke & is the single most alterable risk factor  If you smoke or have smoked in the last 12 months, you are advised to quit smoking for your health.  Most of the excess cardiovascular risk related to smoking disappears within a year of stopping.  Ask you doctor about anti-smoking medications  [place] Quit Line: 1-800-QUIT NOW  Free Smoking Cessation Classes [phone]    CHOLESTEROL Know your levels; limit fat & cholesterol in your diet  Lipid Panel     Component Value Date/Time   CHOL 211 (H) 02/26/2017 0328   TRIG 153 (H) 02/26/2017 0328   HDL 41 02/26/2017 0328   CHOLHDL 5.1 02/26/2017 0328   VLDL 31 02/26/2017 0328   LDLCALC 139 (H) 02/26/2017 0328      Many patients benefit from treatment even if their cholesterol is at goal.  Goal: Total Cholesterol (CHOL) less than 160  Goal:  Triglycerides (TRIG) less than 150  Goal:  HDL greater than 40  Goal:  LDL (LDLCALC) less than 100   BLOOD PRESSURE American Stroke Association blood pressure target is less that 120/80 mm/Hg  Your discharge blood pressure is:  BP: (!) 116/48  Monitor your blood pressure  Limit your salt and alcohol intake  Many individuals will require more than one medication for high blood pressure  DIABETES (  A1c is a blood sugar average for last 3 months) Goal HGBA1c is under 7% (HBGA1c is blood sugar average for last 3 months)  Diabetes:    Lab Results  Component Value Date   HGBA1C 6.5 (H) 02/26/2017     Your HGBA1c can be lowered with medications, healthy diet, and exercise.  Check your blood sugar as directed by your physician  Call your physician if you experience unexplained or low blood sugars.  PHYSICAL ACTIVITY/REHABILITATION Goal is 30 minutes at least 4 days per week  Activity: Increase activity slowly, Therapies: Physical Therapy: Home Health Return to work:   Activity decreases your risk of heart attack and stroke and makes your heart stronger.  It helps control your weight and blood pressure; helps you relax and can improve your mood.  Participate in a regular exercise program.  Talk with your doctor about the best form of exercise for you (dancing, walking, swimming, cycling).  DIET/WEIGHT Goal is to maintain a healthy weight  Your discharge diet is: Diet regular Room service appropriate? Yes; Fluid consistency: Thin Fall precautions   liquids Your height is:  Height: 5\' 6"  (167.6 cm) Your current weight is: Weight: 74.3 kg (163 lb 12.8 oz) Your Body Mass Index (BMI) is:  BMI (Calculated): 26.45  Following the type of diet specifically designed for you will help prevent another stroke.  Your goal weight range is:    Your goal Body Mass Index (BMI) is 19-24.  Healthy food habits can help reduce 3 risk factors for stroke:  High cholesterol, hypertension, and excess weight.  RESOURCES Stroke/Support Group:  Call [phone]   STROKE EDUCATION PROVIDED/REVIEWED AND GIVEN TO PATIENT Stroke warning signs and symptoms How to activate emergency medical system (call 911). Medications prescribed at discharge. Need for follow-up after discharge. Personal risk factors for stroke. Pneumonia vaccine given:  Flu vaccine given:  My questions have been answered, the writing is legible, and I understand these instructions.  I will adhere to these goals & educational materials that have been provided to me after my discharge from the hospital.   [place] Cigarette smoking nearly doubles your risk of having a stroke & is the single most alterable risk factor  If you smoke or have smoked in the last 12 months, you are advised to quit smoking for your health.  Most of the excess cardiovascular risk related to smoking disappears within a year of stopping.  Ask you doctor about anti-smoking medications  [place] Quit Line: 1-800-QUIT NOW  Free Smoking Cessation Classes [phone]  CHOLESTEROL Know your levels; limit fat & cholesterol in your diet  Lipid Panel     Component Value Date/Time   CHOL 211 (H) 02/26/2017 0328   TRIG 153 (H) 02/26/2017 0328   HDL 41 02/26/2017 0328   CHOLHDL 5.1 02/26/2017 0328   VLDL 31 02/26/2017 0328   LDLCALC 139 (H) 02/26/2017 0328      Many patients benefit from treatment even if their cholesterol is at goal.  Goal: Total Cholesterol (CHOL) less than 160  Goal:   Triglycerides (TRIG) less than 150  Goal:  HDL greater than 40  Goal:  LDL (LDLCALC) less than 100   BLOOD PRESSURE American Stroke Association blood pressure target is less that 120/80 mm/Hg  Your discharge blood pressure is:  BP: (!) 116/48  Monitor your blood pressure  Limit your salt and alcohol intake  Many individuals will require more than one medication for high blood pressure  DIABETES (  A1c is a blood sugar average for last 3 months) Goal HGBA1c is under 7% (HBGA1c is blood sugar average for last 3 months)  Diabetes:     Lab Results  Component Value Date   HGBA1C 6.5 (H) 02/26/2017     Your HGBA1c can be lowered with medications, healthy diet, and exercise.  Check your blood sugar as directed by your physician  Call your physician if you experience unexplained or low blood sugars.  PHYSICAL ACTIVITY/REHABILITATION Goal is 30 minutes at least 4 days per week  Activity: Increase activity slowly, Therapies: Physical Therapy: Home Health Return to work:   Activity decreases your risk of heart attack and stroke and makes your heart stronger.  It helps control your weight and blood pressure; helps you relax and can improve your mood.  Participate in a regular exercise program.  Talk with your doctor about the best form of exercise for you (dancing, walking, swimming, cycling).  DIET/WEIGHT Goal is to maintain a healthy weight  Your discharge diet is: Diet regular Room service appropriate? Yes; Fluid consistency: Thin Fall precautions  liquids Your height is:  Height: 5\' 6"  (167.6 cm) Your current weight is: Weight: 74.3 kg (163 lb 12.8 oz) Your Body Mass Index (BMI) is:  BMI (Calculated): 26.45  Following the type of diet specifically designed for you will help prevent another stroke.  Your goal weight range is:    Your goal Body Mass Index (BMI) is 19-24.  Healthy food habits can help reduce 3 risk factors for stroke:  High cholesterol, hypertension, and excess  weight.  RESOURCES Stroke/Support Group:  Call [phone]   STROKE EDUCATION PROVIDED/REVIEWED AND GIVEN TO PATIENT Stroke warning signs and symptoms How to activate emergency medical system (call 911). Medications prescribed at discharge. Need for follow-up after discharge. Personal risk factors for stroke. Pneumonia vaccine given:  Flu vaccine given:  My questions have been answered, the writing is legible, and I understand these instructions.  I will adhere to these goals & educational materials that have been provided to me after my discharge from the hospital.      My questions have been answered and I understand these instructions. I will adhere to these goals and the provided educational materials after my discharge from the hospital.  Patient/Caregiver Signature _______________________________ Date __________  Clinician Signature _______________________________________ Date __________  Please bring this form and your medication list with you to all your follow-up doctor's appointments.

## 2017-03-07 ENCOUNTER — Telehealth: Payer: Self-pay | Admitting: *Deleted

## 2017-03-07 NOTE — Telephone Encounter (Signed)
Transitional Care call-I spoke with Mr Kurtzman     1. Are you/is patient experiencing any problems since coming home? Are there any questions regarding any aspect of care? NO 2. Are there any questions regarding medications administration/dosing? Are meds being taken as prescribed? Patient should review meds with caller to confirm. Confirmed he has all medications 3. Have there been any falls? NO 4. Has Home Health been to the house and/or have they contacted you? If not, have you tried to contact them? Can we help you contact them? NO HH, GOING TO OUTPT NEURO REHAB, APPT Monday 2/11 5. Are bowels and bladder emptying properly? Are there any unexpected incontinence issues? If applicable, is patient following bowel/bladder programs? NO 6. Any fevers, problems with breathing, unexpected pain? NO 7. Are there any skin problems or new areas of breakdown? NO 8. Has the patient/family member arranged specialty MD follow up (ie cardiology/neurology/renal/surgical/etc)?  Can we help arrange? APPT GIVEN TODAY TO SEE DR PATEL 9. Does the patient need any other services or support that we can help arrange?NO 10. Are caregivers following through as expected in assisting the patient?YES 11. Has the patient quit smoking, drinking alcohol, or using drugs as recommended? N/A  Appointment Friday 03/14/17 @ 1:00(arrive by 12:30) to see Dr Delice Lesch ADDRESS REVIEWED [phone]

## 2017-03-07 NOTE — Progress Notes (Signed)
Social Work Discharge Note  The overall goal for the admission was met for:   Discharge location: Yes - home with significant other to provide 24/7 supervision, as needed  Length of Stay: Yes - 7 days  Discharge activity level: Yes - mod I with some supervision  Home/community participation: Yes  Services provided included: MD, RD, PT, OT, SLP, RN, Pharmacy, [place]: Private Insurance: [employer] Medicare  Follow-up services arranged: Outpatient: PT at [facility] - could not go to [facility] location where he went after 2011 stroke, as they only do ortho pts now.  Comments (or additional information):  Patient/Family verbalized understanding of follow-up arrangements: Yes  Individual responsible for coordination of the follow-up plan: pt with support from significant other  Confirmed correct DME delivered: Raeleen Winstanley, Silvestre Mesi 03/07/2017    Tristain Daily, Silvestre Mesi

## 2017-03-07 NOTE — Progress Notes (Signed)
Social Work Patient ID: Justin [name], male   DOB: 06/13/1950, 67 y.o.   MRN: [id number]   [place] spoke with pt and his significant other, Justin [name], on 03-05-17 after team conference to update them on targeted d/c date of 03-06-17.  They both felt prepared for this.  Pt did not need any DME, but agreed to outpt PT.  Wanted to go back to [facility] location, but they only do ortho, so CSW set him up at [facility].  No concerns/needs/questions.  CSW remains available as needed.

## 2017-03-10 ENCOUNTER — Other Ambulatory Visit: Payer: Self-pay

## 2017-03-10 ENCOUNTER — Ambulatory Visit: Payer: Medicare HMO | Attending: Physical Medicine & Rehabilitation | Admitting: Physical Therapy

## 2017-03-10 ENCOUNTER — Encounter: Payer: Self-pay | Admitting: Physical Therapy

## 2017-03-10 VITALS — BP 178/92

## 2017-03-10 DIAGNOSIS — I639 Cerebral infarction, unspecified: Secondary | ICD-10-CM | POA: Diagnosis present

## 2017-03-10 DIAGNOSIS — M6281 Muscle weakness (generalized): Secondary | ICD-10-CM | POA: Diagnosis present

## 2017-03-10 DIAGNOSIS — R2681 Unsteadiness on feet: Secondary | ICD-10-CM | POA: Diagnosis present

## 2017-03-10 DIAGNOSIS — I69354 Hemiplegia and hemiparesis following cerebral infarction affecting left non-dominant side: Secondary | ICD-10-CM | POA: Diagnosis present

## 2017-03-10 DIAGNOSIS — R29818 Other symptoms and signs involving the nervous system: Secondary | ICD-10-CM

## 2017-03-11 NOTE — Therapy (Signed)
[facility] [address] [place], [place], [address] Phone: [phone]   Fax:  [phone]  Physical Therapy Evaluation  Patient Details  Name: Justin [name] MRN: [id number] Date of Birth: 01-06-1951 Referring Provider: Jamse Arn, MD   Encounter Date: 03/10/2017  PT End of Session - 03/11/17 0749    Visit Number  1    Number of Visits  9    Date for PT Re-Evaluation  04/09/17    Authorization Type  [employer] MCR $40 copay    Authorization Time Period  03/10/17 to 05/09/17    PT Start Time  0800    PT Stop Time  0845    PT Time Calculation (min)  45 min    Activity Tolerance  Patient tolerated treatment well    Behavior During Therapy  [facility] for tasks assessed/performed       Past Medical History:  Diagnosis Date  . Calcium oxalate renal stones   . Chewing tobacco use   . Gallstones    hx/o, s/p choleycystectomy  . H/O echocardiogram 10/2008   no wall thickness, EF 55%, mild mitral regurge  . Hyperlipidemia   . Hypertension    hx/o, noncompliance prior with medication  . Influenza vaccine refused 09/2013  . Rheumatic fever    age 67  . Stroke ([place]) 10/02/2008  . Tinnitus     Past Surgical History:  Procedure Laterality Date  . blood clot  1997   brain  . BRAIN SURGERY  1997   craniotomy, evacuation of hematoma  . CHOLECYSTECTOMY  09/06/2011   Procedure: LAPAROSCOPIC CHOLECYSTECTOMY WITH INTRAOPERATIVE CHOLANGIOGRAM;  Surgeon: Earnstine Regal, MD;  Location: [facility];  Service: General;  Laterality: N/A;  . COLONOSCOPY  01/04/14   normal. Dr. Owens Loffler  . Dental implant    . JOINT REPLACEMENT  2004   LEFT BICEP REPAIR, RTC repair  . LOOP RECORDER INSERTION N/A 02/27/2017   Procedure: LOOP RECORDER INSERTION;  Surgeon: Sanda Klein, MD;  Location: [facility];  Service: Cardiovascular;  Laterality: N/A;  . LOOP RECORDER INSERTION N/A 02/27/2017   Procedure: LOOP RECORDER INSERTION;  Surgeon:  Pixie Casino, MD;  Location: [place];  Service: Cardiovascular;  Laterality: N/A;  . ROTATOR CUFF REPAIR     Right  . SIGMOIDOSCOPY    . TEE WITHOUT CARDIOVERSION N/A 02/27/2017   Procedure: TRANSESOPHAGEAL ECHOCARDIOGRAM (TEE);  Surgeon: Pixie Casino, MD;  Location: [facility] ENDOSCOPY;  Service: Cardiovascular;  Laterality: N/A;    Vitals:   03/10/17 0840  BP: (!) 178/92  Sitting end of session     Subjective Assessment - 03/10/17 0813    Subjective  I still have trouble with my legs and my hands. Left side is weaker than right. Still flashes of light (vision)--that started before alll this. Denies double-vision.    Patient is accompained by:  Family member significant other    Pertinent History  HTN, traumatic ICH '97, CVA'10, DM, tinnitus "my whole life"    Limitations  Walking    Patient Stated Goals  Want to be able to drive, driving the boat, tie fishing lines.     Currently in Pain?  No/denies         [facility] PT Assessment - 03/10/17 0817      Assessment   Medical Diagnosis  rt lower pons; lt basal ganglia, parieto-occipital; rt cerebellar    Referring Provider  Jamse Arn, MD    Onset Date/Surgical  Date  02/24/17    Hand Dominance  Right    Prior Therapy  CIR      Precautions   Precautions  None      Restrictions   Weight Bearing Restrictions  No      Balance Screen   Has the patient fallen in the past 6 months  No      [place] residence    Living Arrangements  Spouse/significant other    Available Help at Discharge  Family;Available 24 hours/day    Type of Home  House    Home Access  Stairs to enter    Entrance Stairs-Number of Steps  3    Entrance Stairs-Rails  Can reach both    Home Layout  Two level;Laundry or work area in [employer]  None      Prior Function   Level of [place]  Retired    Leisure  IT consultant   Overall Cognitive Status   Within Functional Limits for tasks assessed      Observation/Other Assessments   Focus on Therapeutic Outcomes (FOTO)   78/100 (risk adjusted 56/100)      Sensation   Light Touch  Impaired by gross assessment left hand; left foot      Coordination   Gross Motor Movements are Fluid and Coordinated  Yes    Fine Motor Movements are Fluid and Coordinated  Yes    Coordination and Movement Description  coordination WFL B LEs      ROM / Strength   AROM / PROM / Strength  AROM;Strength      AROM   Overall AROM   Within functional limits for tasks performed      Strength   Overall Strength  Deficits    Overall Strength Comments  Lt hip flexion 4/5, dorsiflexion 4/5, hip abdct 3+      Transfers   Transfers  Sit to Stand;Stand to Sit    Sit to Stand  7: Independent    Five time sit to stand comments   6.66    Stand to Sit  7: Independent      Ambulation/Gait   Ambulation/Gait  Yes    Ambulation/Gait Assistance  6: Modified independent (Device/Increase time) slows with higher challenges    Ambulation Distance (Feet)  100 Feet x 3    Assistive device  None    Gait Pattern  Within Functional Limits    Gait velocity  32.8/8.25=3.98 ft/sec    Stairs  Yes    Stairs Assistance  7: Independent    Stair Management Technique  No rails;Alternating pattern;Forwards    Number of Stairs  4    Height of Stairs  6      Functional Gait  Assessment   Gait assessed   Yes    Gait Level Surface  Walks 20 ft in less than 7 sec but greater than 5.5 sec, uses assistive device, slower speed, mild gait deviations, or deviates 6-10 in outside of the 12 in walkway width.    Change in Gait Speed  Able to smoothly change walking speed without loss of balance or gait deviation. Deviate no more than 6 in outside of the 12 in walkway width.    Gait with Horizontal Head Turns  Performs head turns smoothly with no change in gait. Deviates no more than 6 in outside 12 in walkway  width    Gait with Vertical Head  Turns  Performs head turns with no change in gait. Deviates no more than 6 in outside 12 in walkway width.    Gait and Pivot Turn  Pivot turns safely within 3 sec and stops quickly with no loss of balance.    Step Over Obstacle  Is able to step over 2 stacked shoe boxes taped together (9 in total height) without changing gait speed. No evidence of imbalance.    Gait with Narrow Base of Support  Is able to ambulate for 10 steps heel to toe with no staggering.    Gait with Eyes Closed  Walks 20 ft, slow speed, abnormal gait pattern, evidence for imbalance, deviates 10-15 in outside 12 in walkway width. Requires more than 9 sec to ambulate 20 ft.    Ambulating Backwards  Walks 20 ft, no assistive devices, good speed, no evidence for imbalance, normal gait    Steps  Alternating feet, no rail.    Total Score  27             Objective measurements completed on examination: See above findings.              PT Education - 03/11/17 0746    Education provided  Yes    Education Details  results of evaluation; elevated BP after minimal exertion concerning and feel he needs a monitored exercise program initially prior to going to the [employer]) Educated  Patient;Spouse    Methods  Explanation    Comprehension  Verbalized understanding          PT Long Term Goals - 03/11/17 0809      PT LONG TERM GOAL #1   Title  Patient will be independent with HEP, including use of RPE scale as appropriate. (Target 04/09/17)    Time  4    Period  Weeks    Status  New    Target Date  04/09/17      PT LONG TERM GOAL #2   Title  Patient will improve FGA to 29/30 to demonstrate improved balance.    Time  4    Period  Weeks    Status  New      PT LONG TERM GOAL #3   Title  Patient can verbalize modifiable risk factors for CVA and signs/symptoms of CVA.    Time  4    Period  Weeks    Status  New             Plan - 03/11/17 0751    Clinical Impression Statement  Patient  referred to PT s/p multiple CVAs starting 02/24/17 (rt lower pons; lt basal ganglia, parieto-occipital; rt cerebellar). Patient has overall had very good return of function, however left sided weakness persists and patient is anxious to begin working out at [employer]. His BP after minimal exertion today was 178/92 and feel patient would benefit from monitored exercise sessions as we work on building strength and a safe exercise program for him to carryover to the gym. Patient can benefit from the PT interventions listed below to address the deficits listed below.     History and Personal Factors relevant to plan of care:  PMH-HTN, traumatic ICH '97, CVA'10, DM, tinnitus  Personal factors-mechanism of injury (multiple CVAs at one time)    Clinical Presentation  Evolving    Clinical Presentation due to:  acute CVA <6 mos    Clinical Decision  Making  Low    Clinical Impairments Affecting Rehab Potential  HTN    PT Frequency  2x / week    PT Duration  4 weeks    PT Treatment/Interventions  ADLs/Self Care Home Management;Therapeutic activities;Therapeutic exercise;Balance training;Neuromuscular re-education;Patient/family education;Visual/perceptual remediation/compensation    PT Next Visit Plan  MONITOR BP throughout session! Patient has only committed to 2 weeks--did agree to schedule 4 weeks. CVA signs/symptoms and modifiable risk factors. Initiate HEP [facility] (start on [facility]umbent for easy warm-up, strengthening (?quadruped, leg press, etc) ?stretching (?hamstrings)    Recommended Other Services  ?OT evaluation (not recommended by CIR therapist, however pt concerned re: inability to tie fishing lines)    Consulted and Agree with Plan of Care  Patient;Family member/caregiver    Family Member Consulted  Veatrice Kells, significant other       Patient will benefit from skilled therapeutic intervention in order to improve the following deficits and impairments:  Cardiopulmonary status limiting  activity, Decreased activity tolerance, Decreased balance, Decreased strength, Impaired sensation  Visit Diagnosis: Hemiplegia and hemiparesis following cerebral infarction affecting left non-dominant side (HCC) - Plan: PT plan of care cert/re-cert  Muscle weakness (generalized) - Plan: PT plan of care cert/re-cert  Other symptoms and signs involving the nervous system - Plan: PT plan of care cert/re-cert  Unsteadiness on feet - Plan: PT plan of care cert/re-cert     Problem List Patient Active Problem List   Diagnosis Date Noted  . Cognitive deficit, post-stroke   . Diabetes mellitus type 2 in nonobese (HCC)   . Left basal ganglia embolic stroke ([place]) 15/40/0867  . Gait disturbance, post-stroke   . 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 Diabetes type 2, controlled ([place]) 02/26/2017  . Acute ischemic stroke ([place]) 02/26/2017  . Dilated cardiomyopathy ([place])   . Acute on chronic combined systolic and diastolic heart failure ([place])   . Benign essential HTN   . Prediabetes   . History of traumatic brain injury   . History of CVA (cerebrovascular accident)   . 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[place])   . CVA (cerebral vascular accident) ([place]) 02/25/2017  . Medicare annual wellness visit, subsequent 04/01/2016  . Impaired fasting blood sugar 04/01/2016  . Vaccine refused by patient 04/01/2016  . Cramping of feet 04/01/2016  . History of kidney stones 02/24/2015  . Noncompliance 02/24/2015  . Hyperlipidemia 02/24/2015  . Essential hypertension 02/24/2015  . Routine general medical examination at a health care facility 02/24/2015  . Corn of foot 02/24/2015  . Paresthesia of left foot 02/24/2015  . Advanced directives, counseling/discussion 02/24/2015  . Vaccine counseling 02/24/2015  . Tobacco chew use 05/06/2011    Rexanne Mano, PT 03/11/2017, 8:24 AM  [facility] [address] [place], [place], [address] Phone: [phone]   Fax:  [phone]  Name: OCTAVIA MOTTOLA MRN: [id number] Date of Birth: May 22, 1950

## 2017-03-13 ENCOUNTER — Ambulatory Visit: Payer: Medicare HMO | Admitting: Physical Therapy

## 2017-03-14 ENCOUNTER — Encounter: Payer: Self-pay | Admitting: Physical Medicine & Rehabilitation

## 2017-03-14 ENCOUNTER — Encounter: Payer: Medicare HMO | Attending: Physical Medicine & Rehabilitation | Admitting: Physical Medicine & Rehabilitation

## 2017-03-14 ENCOUNTER — Other Ambulatory Visit: Payer: Self-pay

## 2017-03-14 VITALS — BP 158/81 | HR 68

## 2017-03-14 DIAGNOSIS — Z8673 Personal history of transient ischemic attack (TIA), and cerebral infarction without residual deficits: Secondary | ICD-10-CM | POA: Diagnosis not present

## 2017-03-14 DIAGNOSIS — R202 Paresthesia of skin: Secondary | ICD-10-CM

## 2017-03-14 DIAGNOSIS — E119 Type 2 diabetes mellitus without complications: Secondary | ICD-10-CM | POA: Diagnosis not present

## 2017-03-14 DIAGNOSIS — R350 Frequency of micturition: Secondary | ICD-10-CM | POA: Diagnosis not present

## 2017-03-14 DIAGNOSIS — F17220 Nicotine dependence, chewing tobacco, uncomplicated: Secondary | ICD-10-CM | POA: Diagnosis not present

## 2017-03-14 DIAGNOSIS — I639 Cerebral infarction, unspecified: Secondary | ICD-10-CM

## 2017-03-14 DIAGNOSIS — E785 Hyperlipidemia, unspecified: Secondary | ICD-10-CM | POA: Diagnosis not present

## 2017-03-14 DIAGNOSIS — I951 Orthostatic hypotension: Secondary | ICD-10-CM

## 2017-03-14 DIAGNOSIS — Z9049 Acquired absence of other specified parts of digestive tract: Secondary | ICD-10-CM | POA: Diagnosis not present

## 2017-03-14 DIAGNOSIS — Z87442 Personal history of urinary calculi: Secondary | ICD-10-CM | POA: Insufficient documentation

## 2017-03-14 DIAGNOSIS — I69319 Unspecified symptoms and signs involving cognitive functions following cerebral infarction: Secondary | ICD-10-CM

## 2017-03-14 DIAGNOSIS — I10 Essential (primary) hypertension: Secondary | ICD-10-CM | POA: Diagnosis not present

## 2017-03-14 NOTE — Progress Notes (Signed)
Subjective:    Patient ID: Justin [name], male    DOB: 09/01/1950, 67 y.o.   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   DATE OF ADMISSION:  02/27/2017 DATE OF DISCHARGE:  03/06/2017  Wife present, who provides history. At discharge, he was instructed to follow up with Neurology, Cardiology, and PCP with whom he has an appointment. His BP is elevated.  He has not been checking his CBGs because he does not have a glucometer. Denies falls.  Therapies: 2/week DME: Did not require Mobility: No assistive device required.  Pain Inventory Average Pain 0 Pain Right Now 0 My pain is no pain  In the last 24 hours, has pain interfered with the following? General activity no pain Relation with others no pain Enjoyment of life no pain What TIME of day is your pain at its worst? no pain Sleep (in general) Fair  Pain is worse with: no pain Pain improves with: no pain Relief from Meds: no pain  Mobility walk without assistance how many minutes can you walk? 60 ability to climb steps?  yes do you drive?  no  Function retired  Neuro/Psych No problems in this area  Prior Studies Any changes since last visit?  no  Physicians involved in your care Any changes since last visit?  no   Family History  Problem Relation Age of Onset  . Heart disease Mother 5       MI  . Migraines Mother   . Cirrhosis Brother   . Alcohol abuse Father        lived to age 68  . Diabetes Brother   . Colon cancer Neg Hx   . Cancer Neg Hx   . Stroke Neg Hx   . Hypertension Neg Hx   . Hyperlipidemia Neg Hx    Social History   Socioeconomic History  . Marital status: Single    Spouse name: None  . Number of children:  2  . Years of education: None  . Highest education level: None  Social Needs  . Financial resource strain: None  . Food insecurity - worry: None  . Food insecurity - inability: None  . Transportation needs - medical: None  . Transportation needs - non-medical: None  Occupational History    Employer: [employer]  Tobacco Use  . Smoking status: Never Smoker  . Smokeless tobacco: Current User    Types: Chew  Substance and Sexual Activity  . Alcohol use: No    Alcohol/week: 0.0 oz  . Drug use: No  . Sexual activity: None  Other Topics Concern  . None  Social History Narrative   Single, has 2 sons, 67yo and 45yo, electrician, walks regularly, 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     Past Surgical History:  Procedure Laterality Date  . blood clot  1997   brain  . BRAIN SURGERY  1997   craniotomy, evacuation of hematoma  . CHOLECYSTECTOMY  09/06/2011   Procedure: LAPAROSCOPIC CHOLECYSTECTOMY WITH INTRAOPERATIVE CHOLANGIOGRAM;  Surgeon: Earnstine Regal, MD;  Location: [facility];  Service: General;  Laterality: N/A;  . COLONOSCOPY  01/04/14   normal. Dr. Owens Loffler  . Dental implant    . JOINT REPLACEMENT  2004   LEFT BICEP REPAIR, RTC  repair  . LOOP RECORDER INSERTION N/A 02/27/2017   Procedure: LOOP RECORDER INSERTION;  Surgeon: Sanda Klein, MD;  Location: [facility];  Service: Cardiovascular;  Laterality: N/A;  . LOOP RECORDER INSERTION N/A 02/27/2017   Procedure: LOOP RECORDER INSERTION;  Surgeon: Pixie Casino, MD;  Location: [facility];  Service: Cardiovascular;  Laterality: N/A;  . ROTATOR CUFF REPAIR     Right  . SIGMOIDOSCOPY    . TEE WITHOUT CARDIOVERSION N/A 02/27/2017   Procedure: TRANSESOPHAGEAL ECHOCARDIOGRAM (TEE);  Surgeon: Pixie Casino, MD;  [facility];  Service: Cardiovascular;  Laterality: N/A;   Past Medical History:  Diagnosis Date  . Calcium oxalate renal stones   . Chewing tobacco use   . Gallstones    hx/o, s/p  choleycystectomy  . H/O echocardiogram 10/2008   no wall thickness, EF 55%, mild mitral regurge  . Hyperlipidemia   . Hypertension    hx/o, noncompliance prior with medication  . Influenza vaccine refused 09/2013  . Rheumatic fever    age 17  . Stroke ([place]) 10/02/2008  . Tinnitus    BP (!) 158/81   Pulse 68   SpO2 97%   Opioid Risk Score:   Fall Risk Score:  `1  Depression screen PHQ 2/9  Depression screen [facility] 2/9 03/14/2017 04/01/2016 02/24/2015  Decreased Interest 0 0 0  Down, Depressed, Hopeless 0 0 0  PHQ - 2 Score 0 0 0  Altered sleeping 1 - -  Tired, decreased energy 1 - -  Change in appetite 0 - -  Feeling bad or failure about yourself  0 - -  Trouble concentrating 0 - -  Moving slowly or fidgety/restless 0 - -  Suicidal thoughts 0 - -  PHQ-9 Score 2 - -  Difficult doing work/chores Not difficult at all - -   Review of Systems  Constitutional: Negative.   HENT: Negative.   Eyes: Negative.   Respiratory: Negative.   Cardiovascular: Negative.   Gastrointestinal: Negative.   Endocrine: Negative.        High blood sugar  Genitourinary: Positive for frequency.  Musculoskeletal: Negative.   Skin: Negative.   Allergic/Immunologic: Negative.   Neurological: Negative.        Orthostasis  Hematological: Negative.   Psychiatric/Behavioral: Negative.   All other systems reviewed and are negative.      Objective:   Physical Exam Gen.: Well-developed, well-nourished. NAD. Head: Normocephalic. Atraumatic Eyes: EOM are normal. No discharge.  Cardiovascular: RRR and no JVD.  Respiratory: Effort normal breath sounds normal. No respiratory distress.  GI: Bowel sounds are normal. He exhibits no distension.  Musculoskeletal: He exhibits no edema or tenderness.  Neurological: He is alert and oriented.  Followed full commands Motor: RUE/RLE: 5/5 proximal to distal LUE/LLE: 4+-5/5 proximal to distal  Skin. 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   1. Cognitive deficits, dysesthesias, secondary to 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 Status post loop recorder.    Cont therapies  Follow up with Neurology  2. Hypertension.   Cont meds   Encouraged follow up with PCP  3. Diet controlled diabetes mellitus.   Pt states he is going to  follow up with PCP for glucometer to check CBGs  4. Orthostatis  Encouraged adequate hydration  5. Urinary frequency  Cont to monitor, may need follow up if persistent issue  Meds reviewed Referrals reviewed All questions answered

## 2017-03-17 DIAGNOSIS — I2510 Atherosclerotic heart disease of native coronary artery without angina pectoris: Secondary | ICD-10-CM | POA: Insufficient documentation

## 2017-03-17 DIAGNOSIS — Z8673 Personal history of transient ischemic attack (TIA), and cerebral infarction without residual deficits: Secondary | ICD-10-CM | POA: Insufficient documentation

## 2017-03-18 ENCOUNTER — Encounter: Payer: Self-pay | Admitting: Physical Therapy

## 2017-03-18 ENCOUNTER — Ambulatory Visit: Payer: Medicare HMO | Admitting: Physical Therapy

## 2017-03-18 ENCOUNTER — Telehealth: Payer: Self-pay | Admitting: Cardiovascular Disease

## 2017-03-18 DIAGNOSIS — I639 Cerebral infarction, unspecified: Secondary | ICD-10-CM

## 2017-03-18 MED ORDER — LISINOPRIL 40 MG PO TABS: 40.0000 mg | ORAL_TABLET | Freq: Every day | ORAL | 6 refills | Status: DC

## 2017-03-18 NOTE — Telephone Encounter (Signed)
Please increase lisinopril to 40 mg daily.  Will take about 10-14 days to see the full impact on this change on his blood pressure.  Please call us back with the blood pressure readings in about a couple of weeks. MCr

## 2017-03-18 NOTE — Telephone Encounter (Signed)
Spoke to patient's wife.Dr.Croitoru's advice given.

## 2017-03-18 NOTE — Telephone Encounter (Signed)
New Message   Pt c/o BP issue:  1. What are your last 5 BP readings? 2/18- 162/90,  2/19  - 162/102  2. Are you having any other symptoms (ex. Dizziness, headache, blurred vision, passed out)? Dizziness  3. What is your medication issue? None   Bobbi called on behalf of patient that is experiencing high bp. Please call to discuss.

## 2017-03-18 NOTE — Therapy (Signed)
[facility] [address] [place], [place], [address] Phone: [phone]   Fax:  [phone]  Patient Details  Name: Justin [name] MRN: [id number] Date of Birth: 1950/12/04 Referring Provider:  Heinz Knuckles, MD  Encounter Date: 03/18/2017   Patient stated his BP was elevated when he saw his MD yesterday (~168/92 per girlfriend).  BP assessed multiple times with DBP > 100 in each arm. Further treatment deferred and pt educated to call his doctor that is prescribing his BP medications. He and girlfriend report this is his neurologist.     Rexanne Mano, PT 03/18/2017, 8:21 AM  [facility] [address] [place], [place], [address] Phone: [phone]   Fax:  [phone]

## 2017-03-18 NOTE — Telephone Encounter (Signed)
Returned call to patient's wife.She stated husband's B/P has been elevated.Stated he could not have rehab this morning due to B/P 162/102.Yesterday B/P 162/90 pulse 58.Stated he is taking all medications as prescribed.Advised I will send message to Dr.Croitoru for advice.

## 2017-03-20 ENCOUNTER — Other Ambulatory Visit: Payer: Self-pay | Admitting: *Deleted

## 2017-03-20 ENCOUNTER — Ambulatory Visit: Payer: Medicare HMO | Admitting: Physical Therapy

## 2017-03-20 VITALS — BP 176/95 | HR 63

## 2017-03-20 NOTE — Therapy (Signed)
[facility] [address] [place], [place], [address] Phone: [phone]   Fax:  [phone]  Patient Details  Name: Justin [name] MRN: [id number] Date of Birth: [birth year] Referring Provider:  Heinz Knuckles, MD  Encounter Date: 03/20/2017  Patient arrived reporting his home BP unit registered 182/124 this morning. He brought his home unit to be compared to manual BP reading.  ALL BPs taken in right arm with >2 minute rest between (of note, pt's home unit is a wrist unit which can read higher than at arm)  Manual BP 182/104 Patient's unit  189/116 Clinic dynamap 176/95  In addition, pt reports feeling off-balance. "I haven't felt like this since I was in the hospital." Advised patient and girlfriend to go home and relax, no more coffee, and if ANY additional symptoms or symptoms worsen to go to the ED. They verbalized understanding.   *noted MD recently increased BP medication and states it can take 10-14 days for BP to improve. Patient asked to cancel PT appointments until he returns to MD March 5. He agreed to reschedule the missed appointments after March 5.    Rexanne Mano, PT 03/20/2017, 8:27 AM  [facility] [address] [place], [place], [address] Phone: [phone]   Fax:  [phone]

## 2017-03-20 NOTE — Patient Outreach (Signed)
[facility]) Care Management  03/20/2017  [name] 08/14/1950 [id number]   EMMI- Stroke RED ON EMMI ALERT DAY#: 13 DATE: 03/19/17 RED ALERT: [name] to follow-up appointment? No   Outreach attempt #1 to patient. No answer. RN CM left HIPAA compliant message along with contact info.     Plan: RN CM will contact patient within one week.    Lake Bells, RN, BSN, MHA/MSL, [place] Telephonic Care Manager Coordinator [facility] Direct Phone: [phone] Toll Free: [phone] Fax: [phone]

## 2017-03-21 ENCOUNTER — Ambulatory Visit: Payer: Self-pay | Admitting: *Deleted

## 2017-03-21 ENCOUNTER — Other Ambulatory Visit: Payer: Self-pay | Admitting: *Deleted

## 2017-03-21 NOTE — Patient Outreach (Signed)
[facility]) Care Management  03/21/2017  Justin [name] [birth year] [id number]  EMMI- Stroke RED ON EMMI ALERT DAY#: 13 DATE: 03/19/17 RED ALERT: [name] to follow-up appointment? No  Incoming telephone call from patient. HIPAA identifiers verified x's 2 with patient. Spoke with patient. Reviewed and addressed red alert. Patient reported, he had a follow-up hospital appointment with his primary MD on 03/17/17. He has a scheduled appointment Dr. [name] on 04/04/17 and Dr. [name] on 04/30/17. Patient was going to vestibular rehab, which is currently on hold until his blood pressure is controlled. Per EMR, patient's diastolic B/P was greater than 100 at his last rehab visit. Per patient, the nurse contact Dr. [name] while he was at rehab. Dr. [name] prescribed patient Lisinopril 40 mg. Patient stated, his last blood pressure reading was 162/90. RN CM discussed with patient about how his blood pressure is considered elevated. Patient reported, his B/P was 150's over 90's before he was discharged from the hospital. He stated, his primary MD is aware of his blood pressure being elevated and he made no changes to his current medications. Patient stated, his primary MD agreed with his other MD's plan of care. RN CM discussed with patient if he notice any abnormal symptoms to please contact his MD. RN CM asked patient about signs and symptoms of a stroke. Patient stated, he should know about signs and symptoms of a stroke since he had 5 of them. RN CM discussed the acronym FAST when assessing for signs and symptoms of a Stroke.  Patient reported, he has made adjustments to managing his health. He verbalized walking every day. He stated that he removed the salt from the table. He verbalized eating 2 to 3 meals per day as recommended. Patient reported receiving, reading, and understanding his discharge paperwork. He stated, he is taking his medications as prescribed and he can afford his medications.  Patient stated, he can transport himself to his medical appointments. [facility] services and benefits explained to patient. He refused services at this time. He is willing to receive a [facility] letter, brochure,and EMMI educational materials.   Plan: RN CM will send patient EMMI educational materials.  RN CM will contact patient within one week.

## 2017-03-24 ENCOUNTER — Encounter: Payer: Medicare HMO | Admitting: Physical Therapy

## 2017-03-27 ENCOUNTER — Encounter: Payer: Medicare HMO | Admitting: Physical Therapy

## 2017-03-31 ENCOUNTER — Encounter: Payer: Medicare HMO | Admitting: Physical Therapy

## 2017-03-31 ENCOUNTER — Ambulatory Visit (INDEPENDENT_AMBULATORY_CARE_PROVIDER_SITE_OTHER): Payer: Medicare HMO | Admitting: *Deleted

## 2017-03-31 ENCOUNTER — Telehealth: Payer: Self-pay | Admitting: Cardiovascular Disease

## 2017-03-31 DIAGNOSIS — I420 Dilated cardiomyopathy: Secondary | ICD-10-CM

## 2017-03-31 NOTE — Progress Notes (Signed)
Carelink Summary Report / Loop Recorder 

## 2017-03-31 NOTE — Telephone Encounter (Signed)
New message     Pt came in today because he thought he had an in office appointment. I explained to pt about home remote checks and pt said "what if I'm having pain?" I told pt can call Dr. Loletha Grayer and let him know. I wanted to let the nurse know incase she wanted to call and check on him.

## 2017-03-31 NOTE — Telephone Encounter (Signed)
Tried to call patient, went to VM. No message left, has appointment tomorrow.

## 2017-04-01 ENCOUNTER — Encounter: Payer: Self-pay | Admitting: Cardiovascular Disease

## 2017-04-01 ENCOUNTER — Ambulatory Visit: Payer: Medicare HMO | Admitting: Cardiovascular Disease

## 2017-04-01 VITALS — BP 168/108 | HR 69 | Ht 66.0 in | Wt 165.0 lb

## 2017-04-01 DIAGNOSIS — E119 Type 2 diabetes mellitus without complications: Secondary | ICD-10-CM

## 2017-04-01 DIAGNOSIS — E7800 Pure hypercholesterolemia, unspecified: Secondary | ICD-10-CM | POA: Diagnosis not present

## 2017-04-01 DIAGNOSIS — I150 Renovascular hypertension: Secondary | ICD-10-CM | POA: Diagnosis not present

## 2017-04-01 DIAGNOSIS — Z01812 Encounter for preprocedural laboratory examination: Secondary | ICD-10-CM | POA: Diagnosis not present

## 2017-04-01 DIAGNOSIS — I5042 Chronic combined systolic (congestive) and diastolic (congestive) heart failure: Secondary | ICD-10-CM | POA: Diagnosis not present

## 2017-04-01 DIAGNOSIS — I69393 Ataxia following cerebral infarction: Secondary | ICD-10-CM | POA: Diagnosis not present

## 2017-04-01 DIAGNOSIS — I25118 Atherosclerotic heart disease of native coronary artery with other forms of angina pectoris: Secondary | ICD-10-CM | POA: Diagnosis not present

## 2017-04-01 LAB — BASIC METABOLIC PANEL
BUN / CREAT RATIO: 14 (ref 10–24)
BUN: 13 mg/dL (ref 8–27)
CHLORIDE: 104 mmol/L (ref 96–106)
CO2: 28 mmol/L (ref 20–29)
CREATININE: 0.91 mg/dL (ref 0.76–1.27)
Calcium: 9.7 mg/dL (ref 8.6–10.2)
GFR calc Af Amer: 100 mL/min/{1.73_m2} (ref >59)
GFR calc non Af Amer: 86 mL/min/{1.73_m2} (ref >59)
GLUCOSE: 112 mg/dL — AB (ref 65–99)
Potassium: 4.9 mmol/L (ref 3.5–5.2)
Sodium: 141 mmol/L (ref 134–144)

## 2017-04-01 LAB — CBC
Hematocrit: 43.8 % (ref 37.5–51.0)
Hemoglobin: 15.2 g/dL (ref 13.0–17.7)
MCH: 28.6 pg (ref 26.6–33.0)
MCHC: 34.7 g/dL (ref 31.5–35.7)
MCV: 82 fL (ref 79–97)
Platelets: 182 10*3/uL (ref 150–379)
RBC: 5.32 x10E6/uL (ref 4.14–5.80)
RDW: 15 % (ref 12.3–15.4)
WBC: 5 10*3/uL (ref 3.4–10.8)

## 2017-04-01 LAB — PROTIME-INR
INR: 1 (ref 0.8–1.2)
PROTHROMBIN TIME: 10.8 s (ref 9.1–12.0)

## 2017-04-01 MED ORDER — AMLODIPINE BESYLATE 5 MG PO TABS: 5.0000 mg | ORAL_TABLET | Freq: Every day | ORAL | 3 refills | Status: DC

## 2017-04-01 MED ORDER — CARVEDILOL 6.25 MG PO TABS: 6.2500 mg | ORAL_TABLET | Freq: Two times a day (BID) | ORAL | 3 refills | Status: DC

## 2017-04-01 MED ORDER — CLOPIDOGREL BISULFATE 75 MG PO TABS: 75.0000 mg | ORAL_TABLET | Freq: Every day | ORAL | 3 refills | Status: DC

## 2017-04-01 MED ORDER — LISINOPRIL 40 MG PO TABS: 40.0000 mg | ORAL_TABLET | Freq: Every day | ORAL | 3 refills | Status: DC

## 2017-04-01 MED ORDER — ATORVASTATIN CALCIUM 40 MG PO TABS: 40.0000 mg | ORAL_TABLET | Freq: Every day | ORAL | 3 refills | Status: DC

## 2017-04-01 NOTE — H&P (View-Only) (Signed)
Cardiology Office Note:    Date:  04/01/2017   ID:  Justin [name], DOB [birth year], MRN [id number]  PCP:  Heinz Knuckles, MD  Cardiologist:  Sanda Klein, MD   Referring MD: Heinz Knuckles, MD   Chief Complaint  Patient presents with  . Follow-up    will discuss concerns with provider  Left ventricular dysfunction, recent stroke  History of Present Illness:    Justin [name] is a 67 y.o. 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  He underwent transesophageal echo which did not show embolic source and had a loop recorder placed.  However the echo evaluation showed evidence of moderate left ventricular systolic dysfunction with an ejection fraction of 35%, mild dilation of the left ventricular chamber and regional wall motion abnormalities with akinesis of the entire distribution of the right coronary artery and grade 2 diastolic dysfunction.  The echo abnormalities were new when compared to a study from 2010.  He has recently been discharged from inpatient rehab where he has progressed well, although he still has some ataxia.  He is no longer using any aids for walking but continues to have an occasional unsteady gait.  He has otherwise recovered well from his strokes.  Blood pressure control remains elusive and he is frustrated by the difficulty in controlling his blood pressure.  He has a long-standing history of hypertension that has never been well controlled, in part because of his erratic intake of medications.  He has been unable to start vestibular physical therapy due to his poor blood pressure control.  He is taking a full dose aspirin and clopidogrel, atorvastatin, maximum dose ACE inhibitor and a starting dose of carvedilol.  He has diabetes mellitus that is controlled with diet alone, last hemoglobin A1c 6.5%.  LDL cholesterol  was 139 before starting statin therapy.  He has occasional unpleasant numb feeling/slight discomfort in the left precordial area, almost toward the axillary line.  This is not associated with physical activity or any other trigger event that he can think of.  He does not develop this discomfort when he is walking.  Retrospectively, his exercise tolerance and stamina have diminished for at least several months prior to his stroke.  He does not have edema, orthopnea or PND.  He has not had any new neurological dysfunction since his hospital discharge.  He has not had any bleeding problems.  He denies intermittent claudication or syncope.  He is not aware of any palpitations.  So far his loop recorder has not disclosed any arrhythmia.  Past Medical History:  Diagnosis Date  . Calcium oxalate renal stones   . Chewing tobacco use   . Gallstones    hx/o, s/p choleycystectomy  . H/O echocardiogram 10/2008   no wall thickness, EF 55%, mild mitral regurge  . Hyperlipidemia   . Hypertension    hx/o, noncompliance prior with medication  . Influenza vaccine refused 09/2013  . Rheumatic fever    age 67  . Stroke ([place]) 10/02/2008  . Tinnitus     Past Surgical History:  Procedure Laterality Date  . blood clot  1997   brain  . BRAIN SURGERY  1997   craniotomy, evacuation of hematoma  . CHOLECYSTECTOMY  09/06/2011   Procedure: LAPAROSCOPIC CHOLECYSTECTOMY WITH INTRAOPERATIVE CHOLANGIOGRAM;  Surgeon: Earnstine Regal, MD;  Location: [facility];  Service: General;  Laterality: N/A;  .  COLONOSCOPY  01/04/14   normal. Dr. Owens Loffler  . Dental implant    . JOINT REPLACEMENT  2004   LEFT BICEP REPAIR, RTC repair  . LOOP RECORDER INSERTION N/A 02/27/2017   Procedure: LOOP RECORDER INSERTION;  Surgeon: Sanda Klein, MD;  Location: [place] [facility];  Service: Cardiovascular;  Laterality: N/A;  . LOOP RECORDER INSERTION N/A 02/27/2017   Procedure: LOOP RECORDER INSERTION;  Surgeon: Pixie Casino, MD;   Location: [place];  Service: Cardiovascular;  Laterality: N/A;  . ROTATOR CUFF REPAIR     Right  . SIGMOIDOSCOPY    . TEE WITHOUT CARDIOVERSION N/A 02/27/2017   Procedure: TRANSESOPHAGEAL ECHOCARDIOGRAM (TEE);  Surgeon: Pixie Casino, MD;  Location: [facility] END[facility];  Service: Cardiovascular;  Laterality: N/A;    Current Medications: Current Meds  Medication Sig  . aspirin EC 325 MG EC tablet Take 1 tablet (325 mg total) by mouth daily.  Marland Kitchen atorvastatin (LIPITOR) 40 MG tablet Take 1 tablet (40 mg total) by mouth daily at 6 PM.  . carvedilol (COREG) 6.25 MG tablet Take 1 tablet (6.25 mg total) by mouth 2 (two) times daily with a meal.  . clopidogrel (PLAVIX) 75 MG tablet Take 1 tablet (75 mg total) by mouth daily.  Marland Kitchen lisinopril (PRINIVIL,ZESTRIL) 40 MG tablet Take 1 tablet (40 mg total) by mouth daily.  . Multiple Vitamins-Minerals (PRESERVISION AREDS 2) CAPS Take 2 capsules by mouth daily.  . [DISCONTINUED] atorvastatin (LIPITOR) 40 MG tablet Take 1 tablet (40 mg total) by mouth daily at 6 PM.  . [DISCONTINUED] carvedilol (COREG) 3.125 MG tablet Take 1 tablet (3.125 mg total) by mouth 2 (two) times daily with a meal.  . [DISCONTINUED] carvedilol (COREG) 6.25 MG tablet Take 1 tablet (6.25 mg total) by mouth 2 (two) times daily with a meal.  . [DISCONTINUED] clopidogrel (PLAVIX) 75 MG tablet Take 1 tablet (75 mg total) by mouth daily.  . [DISCONTINUED] lisinopril (PRINIVIL,ZESTRIL) 40 MG tablet Take 1 tablet (40 mg total) by mouth daily.     Allergies:   Patient has no known allergies.   Social History   Socioeconomic History  . Marital status: Single    Spouse name: None  . Number of children: 2  . Years of education: None  . Highest education level: None  Social Needs  . Financial resource strain: None  . Food insecurity - worry: None  . Food insecurity - inability: None  . Transportation needs - medical: None  . Transportation needs - non-medical: None  Occupational History     Employer: [employer]  Tobacco Use  . Smoking status: Never Smoker  . Smokeless tobacco: Current User    Types: Chew  Substance and Sexual Activity  . Alcohol use: No    Alcohol/week: 0.0 oz  . Drug use: No  . Sexual activity: None  Other Topics Concern  . None  Social History Narrative   Single, has 2 sons, 10yo and 56yo, electrician, walks regularly, 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       Family History: The patient's family history includes Alcohol abuse in his father; Cirrhosis in his brother; Diabetes in his brother; Heart disease (age of onset: 23) in his mother; Migraines in his mother. There is no history of Colon cancer, Cancer, Stroke, Hypertension, or Hyperlipidemia.  ROS:   Please see the history of present illness.     All other systems reviewed and are negative.  EKGs/Labs/Other Studies Reviewed:    The following  studies were reviewed today: Echo and TEE from hospital stay  EKG:  EKG is not ordered today.  The ekg ordered February 25, 2017 shows normal sinus rhythm with prominent left ventricular hypertrophy with secondary repolarization abnormalities  Recent Labs: 02/28/2017: ALT 12 04/01/2017: BUN 13; Creatinine, Ser 0.91; Hemoglobin 15.2; Platelets 182; Potassium 4.9; Sodium 141  Recent Lipid Panel    Component Value Date/Time   CHOL 211 (H) 02/26/2017 0328   TRIG 153 (H) 02/26/2017 0328   HDL 41 02/26/2017 0328   CHOLHDL 5.1 02/26/2017 0328   VLDL 31 02/26/2017 0328   LDLCALC 139 (H) 02/26/2017 0328    Physical Exam:    VS:  BP (!) 168/108   Pulse 69   Ht 5\' 6"  (1.676 m)   Wt 165 lb (74.8 kg)   SpO2 99%   BMI 26.63 kg/m     Wt Readings from Last 3 Encounters:  04/01/17 165 lb (74.8 kg)  02/27/17 163 lb 12.8 oz (74.3 kg)  02/26/17 152 lb 1.9 oz (69 kg)     GEN: Appears lean and fit, well nourished, well developed in no acute distress HEENT: Normal NECK: No JVD; No carotid bruits LYMPHATICS: No lymphadenopathy CARDIAC: RRR,  no murmurs, rubs, does have an S4 gallop  RESPIRATORY:  Clear to auscultation without rales, wheezing or rhonchi  ABDOMEN: Soft, non-tender, non-distended MUSCULOSKELETAL:  No edema; No deformity  SKIN: Warm and dry NEUROLOGIC:  Alert and oriented x 3, he appears to be walking with a fairly steady gait on my exam.  No focal weakness, cranial nerves II through XII intact. PSYCHIATRIC:  Normal affect   ASSESSMENT:    1. Chronic combined systolic and diastolic heart failure ([place])   2. Renovascular hypertension   3. Coronary artery disease involving native coronary artery of native heart with other form of angina pectoris ([place])   4. Hypercholesterolemia   5. Diabetes mellitus type 2 in nonobese (HCC)   6. 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 Pre-procedure lab exam    PLAN:    In order of problems listed above:  1. CHF: He appears to have NYHA functional class I-2 combined systolic and diastolic heart failure.  He is on full dose ACE inhibitor and on a starting dose of beta-blocker.  He appears to have regional wall motion abnormalities and is a high likelihood that he has ischemic cardiomyopathy.  Probably a good idea to transition from lisinopril to [facility] after his workup for CAD and renal artery stenosis is complete. 2. CAD: His echocardiogram is highly suggestive of previous infarction in the territory of the right coronary artery as an explanation for his cardiomyopathy.  He requires coronary angiography for full characterization.  I am not sure if the "numbness" in his left chest is a form of angina.  It does not appear to be related to exercise.  I have recommended that he undergo diagnostic coronary angiography and possible revascularization stent. This procedure has been fully reviewed with the patient and written informed consent has been obtained.  He is already on aspirin and clopidogrel. 3. HTN: He has long-standing hypertension and is likely that he just has essential hypertension.   However since he appears to be at risk for atherosclerotic disease and his blood pressure is remarkably difficult to control, will also check renal arterial duplex ultrasound for renal artery stenosis.  His dizziness seems to be related to his recent stroke, not to his blood pressure.  We will increase the carvedilol  and add amlodipine.  Try to avoid diuretics since he already complains of frequency. 4. HLP: Started on statin, target LDL under 70, recheck lipid profile in another 2 months. 5. DM: Well-controlled with diet only.  Advised a little bit of additional weight loss.  It would be important for him to return to regular physical exercise as his neurological deficits have improved. 6. Recent CVA: pattern was strongly suggestive of embolic disease with involvement of multiple vascular territories.  He has a loop recorder in place.  So far the atrial fibrillation has not been detected.   Medication Adjustments/Labs and Tests Ordered: Current medicines are reviewed at length with the patient today.  Concerns regarding medicines are outlined above.  Orders Placed This Encounter  Procedures  . Basic metabolic panel  . CBC  . Protime-INR   Meds ordered this encounter  Medications  . DISCONTD: carvedilol (COREG) 6.25 MG tablet    Sig: Take 1 tablet (6.25 mg total) by mouth 2 (two) times daily with a meal.    Dispense:  180 tablet    Refill:  3  . atorvastatin (LIPITOR) 40 MG tablet    Sig: Take 1 tablet (40 mg total) by mouth daily at 6 PM.    Dispense:  90 tablet    Refill:  3  . clopidogrel (PLAVIX) 75 MG tablet    Sig: Take 1 tablet (75 mg total) by mouth daily.    Dispense:  90 tablet    Refill:  3  . lisinopril (PRINIVIL,ZESTRIL) 40 MG tablet    Sig: Take 1 tablet (40 mg total) by mouth daily.    Dispense:  90 tablet    Refill:  3  . amLODipine (NORVASC) 5 MG tablet    Sig: Take 1 tablet (5 mg total) by mouth daily.    Dispense:  90 tablet    Refill:  3  . carvedilol (COREG)  6.25 MG tablet    Sig: Take 1 tablet (6.25 mg total) by mouth 2 (two) times daily with a meal.    Dispense:  180 tablet    Refill:  3    Signed, Sanda Klein, MD  04/01/2017 5:41 PM    [place]

## 2017-04-01 NOTE — Progress Notes (Signed)
Cardiology Office Note:    Date:  04/01/2017   ID:  Justin [name], DOB Jun 01, 1950, MRN [id number]  PCP:  Heinz Knuckles, MD  Cardiologist:  Sanda Klein, MD   Referring MD: Heinz Knuckles, MD   Chief Complaint  Patient presents with  . Follow-up    will discuss concerns with provider  Left ventricular dysfunction, recent stroke  History of Present Illness:    Justin [name] is a 67 y.o. 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  He underwent transesophageal echo which did not show embolic source and had a loop recorder placed.  However the echo evaluation showed evidence of moderate left ventricular systolic dysfunction with an ejection fraction of 35%, mild dilation of the left ventricular chamber and regional wall motion abnormalities with akinesis of the entire distribution of the right coronary artery and grade 2 diastolic dysfunction.  The echo abnormalities were new when compared to a study from 2010.  He has recently been discharged from inpatient rehab where he has progressed well, although he still has some ataxia.  He is no longer using any aids for walking but continues to have an occasional unsteady gait.  He has otherwise recovered well from his strokes.  Blood pressure control remains elusive and he is frustrated by the difficulty in controlling his blood pressure.  He has a long-standing history of hypertension that has never been well controlled, in part because of his erratic intake of medications.  He has been unable to start vestibular physical therapy due to his poor blood pressure control.  He is taking a full dose aspirin and clopidogrel, atorvastatin, maximum dose ACE inhibitor and a starting dose of carvedilol.  He has diabetes mellitus that is controlled with diet alone, last hemoglobin A1c 6.5%.  LDL cholesterol  was 139 before starting statin therapy.  He has occasional unpleasant numb feeling/slight discomfort in the left precordial area, almost toward the axillary line.  This is not associated with physical activity or any other trigger event that he can think of.  He does not develop this discomfort when he is walking.  Retrospectively, his exercise tolerance and stamina have diminished for at least several months prior to his stroke.  He does not have edema, orthopnea or PND.  He has not had any new neurological dysfunction since his hospital discharge.  He has not had any bleeding problems.  He denies intermittent claudication or syncope.  He is not aware of any palpitations.  So far his loop recorder has not disclosed any arrhythmia.  Past Medical History:  Diagnosis Date  . Calcium oxalate renal stones   . Chewing tobacco use   . Gallstones    hx/o, s/p choleycystectomy  . H/O echocardiogram 10/2008   no wall thickness, EF 55%, mild mitral regurge  . Hyperlipidemia   . Hypertension    hx/o, noncompliance prior with medication  . Influenza vaccine refused 09/2013  . Rheumatic fever    age 52  . Stroke ([place]) 10/02/2008  . Tinnitus     Past Surgical History:  Procedure Laterality Date  . blood clot  1997   brain  . BRAIN SURGERY  1997   craniotomy, evacuation of hematoma  . CHOLECYSTECTOMY  09/06/2011   Procedure: LAPAROSCOPIC CHOLECYSTECTOMY WITH INTRAOPERATIVE CHOLANGIOGRAM;  Surgeon: Justin Regal, MD;  Location: [facility];  Service: General;  Laterality: N/A;  .  COLONOSCOPY  01/04/14   normal. Dr. Owens Loffler  . Dental implant    . JOINT REPLACEMENT  2004   LEFT BICEP REPAIR, RTC repair  . LOOP RECORDER INSERTION N/A 02/27/2017   Procedure: LOOP RECORDER INSERTION;  Surgeon: Sanda Klein, MD;  Location: [place] [facility];  Service: Cardiovascular;  Laterality: N/A;  . LOOP RECORDER INSERTION N/A 02/27/2017   Procedure: LOOP RECORDER INSERTION;  Surgeon: Justin Casino, MD;   Location: [place];  Service: Cardiovascular;  Laterality: N/A;  . ROTATOR CUFF REPAIR     Right  . SIGMOIDOSCOPY    . TEE WITHOUT CARDIOVERSION N/A 02/27/2017   Procedure: TRANSESOPHAGEAL ECHOCARDIOGRAM (TEE);  Surgeon: Justin Casino, MD;  Location: [facility] ENDOSCOPY;  Service: Cardiovascular;  Laterality: N/A;    Current Medications: Current Meds  Medication Sig  . aspirin EC 325 MG EC tablet Take 1 tablet (325 mg total) by mouth daily.  Marland Kitchen atorvastatin (LIPITOR) 40 MG tablet Take 1 tablet (40 mg total) by mouth daily at 6 PM.  . carvedilol (COREG) 6.25 MG tablet Take 1 tablet (6.25 mg total) by mouth 2 (two) times daily with a meal.  . clopidogrel (PLAVIX) 75 MG tablet Take 1 tablet (75 mg total) by mouth daily.  Marland Kitchen lisinopril (PRINIVIL,ZESTRIL) 40 MG tablet Take 1 tablet (40 mg total) by mouth daily.  . Multiple Vitamins-Minerals (PRESERVISION AREDS 2) CAPS Take 2 capsules by mouth daily.  . [DISCONTINUED] atorvastatin (LIPITOR) 40 MG tablet Take 1 tablet (40 mg total) by mouth daily at 6 PM.  . [DISCONTINUED] carvedilol (COREG) 3.125 MG tablet Take 1 tablet (3.125 mg total) by mouth 2 (two) times daily with a meal.  . [DISCONTINUED] carvedilol (COREG) 6.25 MG tablet Take 1 tablet (6.25 mg total) by mouth 2 (two) times daily with a meal.  . [DISCONTINUED] clopidogrel (PLAVIX) 75 MG tablet Take 1 tablet (75 mg total) by mouth daily.  . [DISCONTINUED] lisinopril (PRINIVIL,ZESTRIL) 40 MG tablet Take 1 tablet (40 mg total) by mouth daily.     Allergies:   Patient has no known allergies.   Social History   Socioeconomic History  . Marital status: Single    Spouse name: None  . Number of children: 2  . Years of education: None  . Highest education level: None  Social Needs  . Financial resource strain: None  . Food insecurity - worry: None  . Food insecurity - inability: None  . Transportation needs - medical: None  . Transportation needs - non-medical: None  Occupational History     Employer: [employer]  Tobacco Use  . Smoking status: Never Smoker  . Smokeless tobacco: Current User    Types: Chew  Substance and Sexual Activity  . Alcohol use: No    Alcohol/week: 0.0 oz  . Drug use: No  . Sexual activity: None  Other Topics Concern  . None  Social History Narrative   Single, has 2 sons, 55yo and 62yo, electrician, walks regularly, 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       Family History: The patient's family history includes Alcohol abuse in his father; Cirrhosis in his brother; Diabetes in his brother; Heart disease (age of onset: 16) in his mother; Migraines in his mother. There is no history of Colon cancer, Cancer, Stroke, Hypertension, or Hyperlipidemia.  ROS:   Please see the history of present illness.     All other systems reviewed and are negative.  EKGs/Labs/Other Studies Reviewed:    The following  studies were reviewed today: Echo and TEE from hospital stay  EKG:  EKG is not ordered today.  The ekg ordered February 25, 2017 shows normal sinus rhythm with prominent left ventricular hypertrophy with secondary repolarization abnormalities  Recent Labs: 02/28/2017: ALT 12 04/01/2017: BUN 13; Creatinine, Ser 0.91; Hemoglobin 15.2; Platelets 182; Potassium 4.9; Sodium 141  Recent Lipid Panel    Component Value Date/Time   CHOL 211 (H) 02/26/2017 0328   TRIG 153 (H) 02/26/2017 0328   HDL 41 02/26/2017 0328   CHOLHDL 5.1 02/26/2017 0328   VLDL 31 02/26/2017 0328   LDLCALC 139 (H) 02/26/2017 0328    Physical Exam:    VS:  BP (!) 168/108   Pulse 69   Ht 5\' 6"  (1.676 m)   Wt 165 lb (74.8 kg)   SpO2 99%   BMI 26.63 kg/m     Wt Readings from Last 3 Encounters:  04/01/17 165 lb (74.8 kg)  02/27/17 163 lb 12.8 oz (74.3 kg)  02/26/17 152 lb 1.9 oz (69 kg)     GEN: Appears lean and fit, well nourished, well developed in no acute distress HEENT: Normal NECK: No JVD; No carotid bruits LYMPHATICS: No lymphadenopathy CARDIAC: RRR,  no murmurs, rubs, does have an S4 gallop  RESPIRATORY:  Clear to auscultation without rales, wheezing or rhonchi  ABDOMEN: Soft, non-tender, non-distended MUSCULOSKELETAL:  No edema; No deformity  SKIN: Warm and dry NEUROLOGIC:  Alert and oriented x 3, he appears to be walking with a fairly steady gait on my exam.  No focal weakness, cranial nerves II through XII intact. PSYCHIATRIC:  Normal affect   ASSESSMENT:    1. Chronic combined systolic and diastolic heart failure ([place])   2. Renovascular hypertension   3. Coronary artery disease involving native coronary artery of native heart with other form of angina pectoris ([place])   4. Hypercholesterolemia   5. Diabetes mellitus type 2 in nonobese (HCC)   6. 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 Pre-procedure lab exam    PLAN:    In order of problems listed above:  1. CHF: He appears to have NYHA functional class I-2 combined systolic and diastolic heart failure.  He is on full dose ACE inhibitor and on a starting dose of beta-blocker.  He appears to have regional wall motion abnormalities and is a high likelihood that he has ischemic cardiomyopathy.  Probably a good idea to transition from lisinopril to [facility] after his workup for CAD and renal artery stenosis is complete. 2. CAD: His echocardiogram is highly suggestive of previous infarction in the territory of the right coronary artery as an explanation for his cardiomyopathy.  He requires coronary angiography for full characterization.  I am not sure if the "numbness" in his left chest is a form of angina.  It does not appear to be related to exercise.  I have recommended that he undergo diagnostic coronary angiography and possible revascularization stent. This procedure has been fully reviewed with the patient and written informed consent has been obtained.  He is already on aspirin and clopidogrel. 3. HTN: He has long-standing hypertension and is likely that he just has essential hypertension.   However since he appears to be at risk for atherosclerotic disease and his blood pressure is remarkably difficult to control, will also check renal arterial duplex ultrasound for renal artery stenosis.  His dizziness seems to be related to his recent stroke, not to his blood pressure.  We will increase the carvedilol  and add amlodipine.  Try to avoid diuretics since he already complains of frequency. 4. HLP: Started on statin, target LDL under 70, recheck lipid profile in another 2 months. 5. DM: Well-controlled with diet only.  Advised a little bit of additional weight loss.  It would be important for him to return to regular physical exercise as his neurological deficits have improved. 6. Recent CVA: pattern was strongly suggestive of embolic disease with involvement of multiple vascular territories.  He has a loop recorder in place.  So far the atrial fibrillation has not been detected.   Medication Adjustments/Labs and Tests Ordered: Current medicines are reviewed at length with the patient today.  Concerns regarding medicines are outlined above.  Orders Placed This Encounter  Procedures  . Basic metabolic panel  . CBC  . Protime-INR   Meds ordered this encounter  Medications  . DISCONTD: carvedilol (COREG) 6.25 MG tablet    Sig: Take 1 tablet (6.25 mg total) by mouth 2 (two) times daily with a meal.    Dispense:  180 tablet    Refill:  3  . atorvastatin (LIPITOR) 40 MG tablet    Sig: Take 1 tablet (40 mg total) by mouth daily at 6 PM.    Dispense:  90 tablet    Refill:  3  . clopidogrel (PLAVIX) 75 MG tablet    Sig: Take 1 tablet (75 mg total) by mouth daily.    Dispense:  90 tablet    Refill:  3  . lisinopril (PRINIVIL,ZESTRIL) 40 MG tablet    Sig: Take 1 tablet (40 mg total) by mouth daily.    Dispense:  90 tablet    Refill:  3  . amLODipine (NORVASC) 5 MG tablet    Sig: Take 1 tablet (5 mg total) by mouth daily.    Dispense:  90 tablet    Refill:  3  . carvedilol (COREG)  6.25 MG tablet    Sig: Take 1 tablet (6.25 mg total) by mouth 2 (two) times daily with a meal.    Dispense:  180 tablet    Refill:  3    Signed, Sanda Klein, MD  04/01/2017 5:41 PM    [place]

## 2017-04-01 NOTE — Patient Instructions (Addendum)
Dr [name] has recommended making the following medication changes: 1. START Amlodipine 5 mg - take 1 tablet by mouth daily 2. INCREASE Carvedilol to 6.25 mg twice daily  Your physician has requested that you have a renal artery duplex. During this test, an ultrasound is used to evaluate blood flow to the kidneys. Allow one hour for this exam. Do not eat after midnight the day before and avoid carbonated beverages. Take your medications as you usually do.    [facility] [address] [place] [address] Dept: [phone] Loc: [place]  04/01/2017  You are scheduled for a Cardiac Catheterization on Wednesday, March 6 with Dr. Glenetta Hew.  1. Please arrive at the [facility] (Main Entrance A) at [facility]: [address] [place], [place] [address] at 12:30 PM (two hours before your procedure to ensure your preparation). Free valet parking service is available.  Special note: Every effort is made to have your procedure done on time. Please understand that emergencies sometimes delay scheduled procedures. 2. Diet: You may have a clear liquid breakfast, but nothing after 8 AM on your procedure day. 3. Labs: TODAY STAT 4. Medication instructions in preparation for your procedure:. - HOLD Lisinopril the morning of your procedure - On the morning of your procedure, take your Aspirin and Plavix/Clopidogrel and any morning medicines NOT listed above.  You may use sips of water. 5. Plan for one night stay--bring personal belongings. 6. Bring a current list of your medications and current insurance cards. 7. You MUST have a responsible person to drive you home. 8. Someone MUST be with you the first 24 hours after you arrive home or your discharge will be delayed. 9. Please wear clothes that are easy to get on and off and wear slip-on shoes.  Thank you for allowing [place] to care  for you!   -- [facility]

## 2017-04-02 ENCOUNTER — Ambulatory Visit (HOSPITAL_COMMUNITY)
Admission: RE | Disposition: A | Discharge status: Home / Self Care | Payer: Self-pay | Source: Ambulatory Visit | Attending: Cardiology

## 2017-04-02 ENCOUNTER — Ambulatory Visit (HOSPITAL_COMMUNITY)
Admission: RE | Admit: 2017-04-02 | Discharge: 2017-04-03 | Disposition: A | Discharge status: Home / Self Care | Payer: Medicare HMO | Source: Ambulatory Visit | Attending: Cardiology | Admitting: Cardiology

## 2017-04-02 DIAGNOSIS — I110 Hypertensive heart disease with heart failure: Secondary | ICD-10-CM | POA: Diagnosis not present

## 2017-04-02 DIAGNOSIS — I255 Ischemic cardiomyopathy: Secondary | ICD-10-CM | POA: Insufficient documentation

## 2017-04-02 DIAGNOSIS — I69393 Ataxia following cerebral infarction: Secondary | ICD-10-CM | POA: Insufficient documentation

## 2017-04-02 DIAGNOSIS — I420 Dilated cardiomyopathy: Secondary | ICD-10-CM | POA: Diagnosis present

## 2017-04-02 DIAGNOSIS — E119 Type 2 diabetes mellitus without complications: Secondary | ICD-10-CM | POA: Insufficient documentation

## 2017-04-02 DIAGNOSIS — Z720 Tobacco use: Secondary | ICD-10-CM | POA: Diagnosis not present

## 2017-04-02 DIAGNOSIS — I5042 Chronic combined systolic (congestive) and diastolic (congestive) heart failure: Secondary | ICD-10-CM

## 2017-04-02 DIAGNOSIS — Z8673 Personal history of transient ischemic attack (TIA), and cerebral infarction without residual deficits: Secondary | ICD-10-CM

## 2017-04-02 DIAGNOSIS — R931 Abnormal findings on diagnostic imaging of heart and coronary circulation: Secondary | ICD-10-CM | POA: Diagnosis present

## 2017-04-02 DIAGNOSIS — E782 Mixed hyperlipidemia: Secondary | ICD-10-CM | POA: Diagnosis present

## 2017-04-02 DIAGNOSIS — I2584 Coronary atherosclerosis due to calcified coronary lesion: Secondary | ICD-10-CM | POA: Diagnosis not present

## 2017-04-02 DIAGNOSIS — Z955 Presence of coronary angioplasty implant and graft: Secondary | ICD-10-CM

## 2017-04-02 DIAGNOSIS — Z7902 Long term (current) use of antithrombotics/antiplatelets: Secondary | ICD-10-CM | POA: Insufficient documentation

## 2017-04-02 DIAGNOSIS — E7800 Pure hypercholesterolemia, unspecified: Secondary | ICD-10-CM | POA: Insufficient documentation

## 2017-04-02 DIAGNOSIS — Z7982 Long term (current) use of aspirin: Secondary | ICD-10-CM | POA: Diagnosis not present

## 2017-04-02 DIAGNOSIS — Z9861 Coronary angioplasty status: Secondary | ICD-10-CM | POA: Diagnosis present

## 2017-04-02 DIAGNOSIS — I150 Renovascular hypertension: Secondary | ICD-10-CM | POA: Diagnosis not present

## 2017-04-02 DIAGNOSIS — Z8249 Family history of ischemic heart disease and other diseases of the circulatory system: Secondary | ICD-10-CM | POA: Diagnosis not present

## 2017-04-02 DIAGNOSIS — I2510 Atherosclerotic heart disease of native coronary artery without angina pectoris: Secondary | ICD-10-CM

## 2017-04-02 HISTORY — PX: INTRAVASCULAR PRESSURE WIRE/FFR STUDY: CATH118243

## 2017-04-02 HISTORY — DX: Atherosclerotic heart disease of native coronary artery without angina pectoris: I25.10

## 2017-04-02 HISTORY — PX: LEFT HEART CATH AND CORONARY ANGIOGRAPHY: CATH118249

## 2017-04-02 HISTORY — DX: Coronary angioplasty status: Z98.61

## 2017-04-02 HISTORY — PX: CORONARY PRESSURE/FFR STUDY: CATH118243

## 2017-04-02 HISTORY — DX: Abnormal findings on diagnostic imaging of heart and coronary circulation: R93.1

## 2017-04-02 HISTORY — DX: Atherosclerotic heart disease of native coronary artery without angina pectoris: Z98.61

## 2017-04-02 HISTORY — PX: CORONARY STENT INTERVENTION: CATH118234

## 2017-04-02 LAB — GLUCOSE, CAPILLARY
GLUCOSE-CAPILLARY: 85 mg/dL (ref 65–99)
Glucose-Capillary: 169 mg/dL — ABNORMAL HIGH (ref 65–99)

## 2017-04-02 LAB — POCT ACTIVATED CLOTTING TIME: Activated Clotting Time: 323 seconds

## 2017-04-02 SURGERY — LEFT HEART CATH AND CORONARY ANGIOGRAPHY
Anesthesia: LOCAL

## 2017-04-02 MED ORDER — LISINOPRIL 40 MG PO TABS
40.0000 mg | ORAL_TABLET | Freq: Every day | ORAL | Status: DC
  Administered 2017-04-02: 40 mg via ORAL
  Filled 2017-04-02: qty 1

## 2017-04-02 MED ORDER — ASPIRIN EC 325 MG PO TBEC: 325.0000 mg | DELAYED_RELEASE_TABLET | Freq: Every day | ORAL | Status: DC

## 2017-04-02 MED ORDER — CLOPIDOGREL BISULFATE 75 MG PO TABS: 75.0000 mg | ORAL_TABLET | Freq: Every day | ORAL | Status: DC

## 2017-04-02 MED ORDER — CLOPIDOGREL BISULFATE 300 MG PO TABS
ORAL_TABLET | ORAL | Status: AC
  Filled 2017-04-02: qty 2

## 2017-04-02 MED ORDER — LABETALOL HCL 5 MG/ML IV SOLN: 10.0000 mg | INTRAVENOUS | Status: AC | PRN

## 2017-04-02 MED ORDER — SODIUM CHLORIDE 0.9% FLUSH: 3.0000 mL | INTRAVENOUS | Status: DC | PRN

## 2017-04-02 MED ORDER — ATORVASTATIN CALCIUM 40 MG PO TABS: 40.0000 mg | ORAL_TABLET | Freq: Every day | ORAL | Status: DC

## 2017-04-02 MED ORDER — VERAPAMIL HCL 2.5 MG/ML IV SOLN
INTRAVENOUS | Status: DC | PRN
  Administered 2017-04-02: 16:00:00 via INTRA_ARTERIAL

## 2017-04-02 MED ORDER — IOPAMIDOL (ISOVUE-370) INJECTION 76%
INTRAVENOUS | Status: DC | PRN
  Administered 2017-04-02: 70 mL via INTRA_ARTERIAL

## 2017-04-02 MED ORDER — FAMOTIDINE IN NACL 20-0.9 MG/50ML-% IV SOLN
INTRAVENOUS | Status: DC | PRN
  Administered 2017-04-02: 20 mg via INTRAVENOUS

## 2017-04-02 MED ORDER — IOPAMIDOL (ISOVUE-370) INJECTION 76%
INTRAVENOUS | Status: AC
  Filled 2017-04-02: qty 100

## 2017-04-02 MED ORDER — SODIUM CHLORIDE 0.9 % IV SOLN: 250.0000 mL | INTRAVENOUS | Status: DC | PRN

## 2017-04-02 MED ORDER — CARVEDILOL 3.125 MG PO TABS
6.2500 mg | ORAL_TABLET | Freq: Two times a day (BID) | ORAL | Status: DC
  Administered 2017-04-02 – 2017-04-03 (×2): 6.25 mg via ORAL
  Filled 2017-04-02: qty 1
  Filled 2017-04-02: qty 2
  Filled 2017-04-02: qty 1

## 2017-04-02 MED ORDER — MIDAZOLAM HCL 2 MG/2ML IJ SOLN
INTRAMUSCULAR | Status: AC
  Filled 2017-04-02: qty 2

## 2017-04-02 MED ORDER — HEPARIN (PORCINE) IN NACL 2-0.9 UNIT/ML-% IJ SOLN
INTRAMUSCULAR | Status: AC
  Filled 2017-04-02: qty 1000

## 2017-04-02 MED ORDER — NITROGLYCERIN 1 MG/10 ML FOR IR/CATH LAB
INTRA_ARTERIAL | Status: AC
  Filled 2017-04-02: qty 10

## 2017-04-02 MED ORDER — VERAPAMIL HCL 2.5 MG/ML IV SOLN
INTRAVENOUS | Status: AC
  Filled 2017-04-02: qty 2

## 2017-04-02 MED ORDER — LIDOCAINE HCL (PF) 1 % IJ SOLN
INTRAMUSCULAR | Status: DC | PRN
  Administered 2017-04-02: 2 mL

## 2017-04-02 MED ORDER — CLOPIDOGREL BISULFATE 75 MG PO TABS
75.0000 mg | ORAL_TABLET | Freq: Every day | ORAL | Status: DC
  Administered 2017-04-03: 11:00:00 75 mg via ORAL
  Filled 2017-04-02: qty 1

## 2017-04-02 MED ORDER — FENTANYL CITRATE (PF) 100 MCG/2ML IJ SOLN
INTRAMUSCULAR | Status: AC
  Filled 2017-04-02: qty 2

## 2017-04-02 MED ORDER — ASPIRIN 81 MG PO CHEW
81.0000 mg | CHEWABLE_TABLET | Freq: Every day | ORAL | Status: DC
  Administered 2017-04-03: 11:00:00 81 mg via ORAL
  Filled 2017-04-02: qty 1

## 2017-04-02 MED ORDER — ACETAMINOPHEN 325 MG PO TABS: 650.0000 mg | ORAL_TABLET | ORAL | Status: DC | PRN

## 2017-04-02 MED ORDER — CLOPIDOGREL BISULFATE 300 MG PO TABS
ORAL_TABLET | ORAL | Status: DC | PRN
  Administered 2017-04-02: 600 mg via ORAL

## 2017-04-02 MED ORDER — ONDANSETRON HCL 4 MG/2ML IJ SOLN: 4.0000 mg | Freq: Four times a day (QID) | INTRAMUSCULAR | Status: DC | PRN

## 2017-04-02 MED ORDER — NITROGLYCERIN 1 MG/10 ML FOR IR/CATH LAB
INTRA_ARTERIAL | Status: DC | PRN
  Administered 2017-04-02: 200 ug via INTRACORONARY

## 2017-04-02 MED ORDER — FENTANYL CITRATE (PF) 100 MCG/2ML IJ SOLN
INTRAMUSCULAR | Status: DC | PRN
  Administered 2017-04-02: 50 ug via INTRAVENOUS

## 2017-04-02 MED ORDER — FAMOTIDINE IN NACL 20-0.9 MG/50ML-% IV SOLN
INTRAVENOUS | Status: AC
  Filled 2017-04-02: qty 50

## 2017-04-02 MED ORDER — MIDAZOLAM HCL 2 MG/2ML IJ SOLN
INTRAMUSCULAR | Status: DC | PRN
  Administered 2017-04-02: 2 mg via INTRAVENOUS

## 2017-04-02 MED ORDER — ADENOSINE 12 MG/4ML IV SOLN
INTRAVENOUS | Status: AC
  Filled 2017-04-02: qty 16

## 2017-04-02 MED ORDER — ADENOSINE (DIAGNOSTIC) 140MCG/KG/MIN
INTRAVENOUS | Status: DC | PRN
  Administered 2017-04-02: 140 ug/kg/min via INTRAVENOUS

## 2017-04-02 MED ORDER — HEPARIN SODIUM (PORCINE) 1000 UNIT/ML IJ SOLN
INTRAMUSCULAR | Status: DC | PRN
  Administered 2017-04-02 (×2): 4000 [IU] via INTRAVENOUS

## 2017-04-02 MED ORDER — SODIUM CHLORIDE 0.9 % IV SOLN
INTRAVENOUS | Status: DC
  Administered 2017-04-02: 14:00:00 via INTRAVENOUS

## 2017-04-02 MED ORDER — ASPIRIN 81 MG PO CHEW: 81.0000 mg | CHEWABLE_TABLET | ORAL | Status: DC

## 2017-04-02 MED ORDER — SODIUM CHLORIDE 0.9% FLUSH
3.0000 mL | Freq: Two times a day (BID) | INTRAVENOUS | Status: DC
  Administered 2017-04-02: 3 mL via INTRAVENOUS

## 2017-04-02 MED ORDER — OCUVITE-LUTEIN PO CAPS
2.0000 | ORAL_CAPSULE | Freq: Every day | ORAL | Status: DC
  Filled 2017-04-02: qty 2

## 2017-04-02 MED ORDER — HYDRALAZINE HCL 20 MG/ML IJ SOLN: 5.0000 mg | INTRAMUSCULAR | Status: AC | PRN

## 2017-04-02 MED ORDER — AMLODIPINE BESYLATE 5 MG PO TABS
5.0000 mg | ORAL_TABLET | Freq: Every day | ORAL | Status: DC
  Administered 2017-04-03: 11:00:00 5 mg via ORAL
  Filled 2017-04-02: qty 1

## 2017-04-02 MED ORDER — SODIUM CHLORIDE 0.9 % IV SOLN: INTRAVENOUS | Status: AC

## 2017-04-02 MED ORDER — HEPARIN SODIUM (PORCINE) 1000 UNIT/ML IJ SOLN
INTRAMUSCULAR | Status: AC
  Filled 2017-04-02: qty 1

## 2017-04-02 MED ORDER — HEPARIN (PORCINE) IN NACL 2-0.9 UNIT/ML-% IJ SOLN
INTRAMUSCULAR | Status: DC | PRN
  Administered 2017-04-02 (×2): 500 mL via INTRA_ARTERIAL

## 2017-04-02 SURGICAL SUPPLY — 15 items
BALLN EMERGE MR 2.25X15 (BALLOONS) ×2
BALLOON EMERGE MR 2.25X15 (BALLOONS) IMPLANT
CATH LAUNCHER 6FR JR4 (CATHETERS) ×1 IMPLANT
CATH OPTITORQUE TIG 4.0 5F (CATHETERS) ×1 IMPLANT
DEVICE RAD COMP TR BAND LRG (VASCULAR PRODUCTS) ×1 IMPLANT
GLIDESHEATH SLEND A-KIT 6F 22G (SHEATH) ×1 IMPLANT
GUIDEWIRE INQWIRE 1.5J.035X260 (WIRE) IMPLANT
GUIDEWIRE PRESSURE COMET II (WIRE) ×1 IMPLANT
INQWIRE 1.5J .035X260CM (WIRE) ×2
KIT ENCORE 26 ADVANTAGE (KITS) ×1 IMPLANT
KIT HEART LEFT (KITS) ×2 IMPLANT
PACK CARDIAC CATHETERIZATION (CUSTOM PROCEDURE TRAY) ×2 IMPLANT
STENT SIERRA 2.75 X 18 MM (Permanent Stent) ×1 IMPLANT
TRANSDUCER W/STOPCOCK (MISCELLANEOUS) ×2 IMPLANT
TUBING CIL FLEX 10 FLL-RA (TUBING) ×2 IMPLANT

## 2017-04-02 NOTE — Interval H&P Note (Signed)
History and Physical Interval Note:  04/02/2017 5:48 PM  Justin [name]  has presented today for surgery, with the diagnosis of hf-abnormal echo with wall wall motion normality.  The various methods of treatment have been discussed with the patient and family. After consideration of risks, benefits and other options for treatment, the patient has consented to  Procedure(s): LEFT HEART CATH AND CORONARY ANGIOGRAPHY (N/A) CORONARY STENT INTERVENTION (N/A) INTRAVASCULAR PRESSURE WIRE/FFR STUDY (N/A) as a surgical intervention .  The patient's history has been reviewed, patient examined, no change in status, stable for surgery.  I have reviewed the patient's chart and labs.  Questions were answered to the patient's satisfaction.    Cath Lab Visit (complete for each Cath Lab visit)  Clinical Evaluation Leading to the Procedure:   ACS: No.  Non-ACS:    Anginal Classification: CCS II  Anti-ischemic medical therapy: Minimal Therapy (1 class of medications)  Non-Invasive Test Results: High-risk stress test findings: cardiac mortality >3%/year abnormal echo with regional wall motion abnormality suggestive of RCA disease.  Prior CABG: No previous CABG   Glenetta Hew

## 2017-04-03 ENCOUNTER — Encounter: Payer: Medicare HMO | Admitting: Physical Therapy

## 2017-04-03 ENCOUNTER — Encounter (HOSPITAL_COMMUNITY): Payer: Self-pay | Admitting: Cardiology

## 2017-04-03 DIAGNOSIS — I2584 Coronary atherosclerosis due to calcified coronary lesion: Secondary | ICD-10-CM | POA: Diagnosis not present

## 2017-04-03 DIAGNOSIS — I2510 Atherosclerotic heart disease of native coronary artery without angina pectoris: Secondary | ICD-10-CM | POA: Diagnosis not present

## 2017-04-03 DIAGNOSIS — I5042 Chronic combined systolic (congestive) and diastolic (congestive) heart failure: Secondary | ICD-10-CM | POA: Diagnosis not present

## 2017-04-03 DIAGNOSIS — I420 Dilated cardiomyopathy: Secondary | ICD-10-CM | POA: Diagnosis not present

## 2017-04-03 DIAGNOSIS — I6389 Other cerebral infarction: Secondary | ICD-10-CM

## 2017-04-03 DIAGNOSIS — Z9861 Coronary angioplasty status: Secondary | ICD-10-CM | POA: Diagnosis not present

## 2017-04-03 DIAGNOSIS — R931 Abnormal findings on diagnostic imaging of heart and coronary circulation: Secondary | ICD-10-CM | POA: Diagnosis present

## 2017-04-03 DIAGNOSIS — E7800 Pure hypercholesterolemia, unspecified: Secondary | ICD-10-CM | POA: Diagnosis not present

## 2017-04-03 HISTORY — DX: Abnormal findings on diagnostic imaging of heart and coronary circulation: R93.1

## 2017-04-03 LAB — GLUCOSE, CAPILLARY: GLUCOSE-CAPILLARY: 131 mg/dL — AB (ref 65–99)

## 2017-04-03 LAB — CBC
HEMATOCRIT: 38.1 % — AB (ref 39.0–52.0)
Hemoglobin: 12.9 g/dL — ABNORMAL LOW (ref 13.0–17.0)
MCH: 29 pg (ref 26.0–34.0)
MCHC: 33.9 g/dL (ref 30.0–36.0)
MCV: 85.6 fL (ref 78.0–100.0)
Platelets: 157 10*3/uL (ref 150–400)
RBC: 4.45 MIL/uL (ref 4.22–5.81)
RDW: 13.9 % (ref 11.5–15.5)
WBC: 5.1 10*3/uL (ref 4.0–10.5)

## 2017-04-03 LAB — BASIC METABOLIC PANEL
Anion gap: 7 (ref 5–15)
BUN: 13 mg/dL (ref 6–20)
CO2: 25 mmol/L (ref 22–32)
CREATININE: 1.01 mg/dL (ref 0.61–1.24)
Calcium: 8.4 mg/dL — ABNORMAL LOW (ref 8.9–10.3)
Chloride: 106 mmol/L (ref 101–111)
GFR calc non Af Amer: >60 mL/min (ref >60)
Glucose, Bld: 166 mg/dL — ABNORMAL HIGH (ref 65–99)
POTASSIUM: 3.9 mmol/L (ref 3.5–5.1)
SODIUM: 138 mmol/L (ref 135–145)

## 2017-04-03 MED ORDER — SODIUM CHLORIDE 0.9 % IV SOLN: 8.0000 mg | Freq: Once | INTRAVENOUS | Status: DC

## 2017-04-03 MED ORDER — ASPIRIN 81 MG PO CHEW: 81.0000 mg | CHEWABLE_TABLET | Freq: Every day | ORAL | Status: DC

## 2017-04-03 MED ORDER — SACUBITRIL-VALSARTAN 24-26 MG PO TABS: 1.0000 | ORAL_TABLET | Freq: Two times a day (BID) | ORAL | Status: DC

## 2017-04-03 MED ORDER — DIPHENHYDRAMINE HCL 50 MG/ML IJ SOLN: 50.0000 mg | Freq: Once | INTRAMUSCULAR | Status: DC

## 2017-04-03 MED ORDER — SACUBITRIL-VALSARTAN 24-26 MG PO TABS: 1.0000 | ORAL_TABLET | Freq: Two times a day (BID) | ORAL | 0 refills | Status: DC

## 2017-04-03 MED ORDER — PROSIGHT PO TABS
1.0000 | ORAL_TABLET | Freq: Every day | ORAL | Status: DC
  Administered 2017-04-03: 11:00:00 1 via ORAL
  Filled 2017-04-03: qty 1

## 2017-04-03 MED ORDER — DIPHENHYDRAMINE HCL 25 MG PO CAPS: 50.0000 mg | ORAL_CAPSULE | Freq: Once | ORAL | Status: DC

## 2017-04-03 MED ORDER — NITROGLYCERIN 0.4 MG SL SUBL: 0.4000 mg | SUBLINGUAL_TABLET | SUBLINGUAL | 4 refills | Status: AC | PRN

## 2017-04-03 MED ORDER — NITROGLYCERIN 0.4 MG SL SUBL: 0.4000 mg | SUBLINGUAL_TABLET | SUBLINGUAL | Status: DC | PRN

## 2017-04-03 MED ORDER — ANGIOPLASTY BOOK
Freq: Once | Status: AC
  Administered 2017-04-03: 06:00:00
  Filled 2017-04-03: qty 1

## 2017-04-03 MED FILL — Heparin Sodium (Porcine) 2 Unit/ML in Sodium Chloride 0.9%: INTRAMUSCULAR | Qty: 1000 | Status: AC

## 2017-04-03 NOTE — Progress Notes (Signed)
RE: Benefit check  Received: Today  Message Contents  Memory Argue CMA        # 3.  S/W ANGELA @ [employer] RX # [phone]   ENTRESTO 24-26 MG BID  COVER- YES  CO-PAY- $ 45.00  TIER- 3 DRUG  PRIOR APPROVAL- YES # [phone]   PREFERRED PHARMACY : [facility]    Entresto coupon card given to patient with explanation of usage. Mindi Slicker [facility] [phone]

## 2017-04-03 NOTE — Progress Notes (Signed)
Progress Note  Patient Name: Justin [name] Date of Encounter: 04/03/2017  Primary Cardiologist: Sanda Klein, MD   Subjective   Feels well. NO CP, no SOB  Inpatient Medications    Scheduled Meds: . amLODipine  5 mg Oral Daily  . aspirin  81 mg Oral Daily  . atorvastatin  40 mg Oral q1800  . carvedilol  6.25 mg Oral BID WC  . clopidogrel  75 mg Oral Q breakfast  . lisinopril  40 mg Oral Daily  . multivitamin  1 tablet Oral Daily  . sodium chloride flush  3 mL Intravenous Q12H   Continuous Infusions: . sodium chloride     PRN Meds: sodium chloride, acetaminophen, ondansetron (ZOFRAN) IV, sodium chloride flush   Vital Signs    Vitals:   04/02/17 2000 04/02/17 2100 04/03/17 0610 04/03/17 0700  BP: (!) 170/76 (!) 156/79 (!) 120/59 132/64  Pulse: 62 66 65 (!) 56  Resp: 17 19 15 14   Temp:   98.1 F (36.7 C) 98 F (36.7 C)  TempSrc:   Oral Oral  SpO2: 98% 98% 98% 98%  Weight:   163 lb 2.3 oz (74 kg)   Height:        Intake/Output Summary (Last 24 hours) at 04/03/2017 0820 Last data filed at 04/02/2017 2130 Gross per 24 hour  Intake 636.25 ml  Output -  Net 636.25 ml   Filed Weights   04/02/17 1237 04/03/17 0610  Weight: 165 lb (74.8 kg) 163 lb 2.3 oz (74 kg)    Telemetry    NSR - Personally Reviewed  ECG    NSR - Personally Reviewed  Physical Exam   GEN: No acute distress.   Neck: No JVD Cardiac: RRR, no murmurs, rubs, or gallops.  Respiratory: Clear to auscultation bilaterally. GI: Soft, nontender, non-distended  MS: No edema; No deformity. Cath site normal Neuro:  Nonfocal  Psych: Normal affect   Labs    Chemistry Recent Labs  Lab 04/01/17 1118 04/03/17 0331  NA 141 138  K 4.9 3.9  CL 104 106  CO2 28 25  GLUCOSE 112* 166*  BUN 13 13  CREATININE 0.91 1.01  CALCIUM 9.7 8.4*  GFRNONAA 86 >60  GFRAA 100 >60  ANIONGAP  --  7     Hematology Recent Labs  Lab 04/01/17 1118 04/03/17 0331  WBC 5.0 5.1  RBC 5.32 4.45  HGB 15.2  12.9*  HCT 43.8 38.1*  MCV 82 85.6  MCH 28.6 29.0  MCHC 34.7 33.9  RDW 15.0 13.9  PLT 182 157    Cardiac EnzymesNo results for input(s): TROPONINI in the last 168 hours. No results for input(s): TROPIPOC in the last 168 hours.   BNPNo results for input(s): BNP, PROBNP in the last 168 hours.   DDimer No results for input(s): DDIMER in the last 168 hours.   Radiology    No results found.  Cardiac Studies   Cath 04/02/17:  The left ventricular systolic function is normal.  Prox RCA-1 lesion is 70% stenosed. Prox RCA-2 lesion is 50% stenosed. -- FFR 0.8  A drug-eluting stent was successfully placed using a STENT SIERRA 2.75 X 18 MM.  Post intervention, there is a 0% residual stenosis.  Dist RCA lesion is 20% stenosed.  Mid LM to Dist LM lesion is 30% stenosed. Prox Cx lesion is 40% stenosed.  LV end diastolic pressure is mildly elevated.   FFR + pRCA lesion treated with single DES --> otherwise essentially diffuse mild  CAD elsewhere with 30% LM. Mildly elevated LVEDP.  Plan:  Overnight observation and post procedure unit with TR band removal.  Recommend 1 year of DAPT, but would be okay to stop aspirin after 6 months if clinically indicated.  Continue aggressive risk factor modification with beta-blocker, statin and ARB/ACE I   Anticipate discharge tomorrow.  Follow-up with Dr. [name]   Patient Profile     67 y.o. male both ischemic and non ischemic cardiomyopathy with CVA, EF 35%, CAD with DES to RCA FFR + proximal lesion  Assessment & Plan    CAD  - RCA DES prox. DAPT for one year (could consider Plavix longer with CVA).   - Cardiac rehab  Dilated cardiomyopathy/ chronic systolic heart failure  - As outpatient, we will change to Entresto mid dose. Stop lisinopril for 36 hours prior to start.   - LVEDP mildly elevated  CVA   - loop in place - no arrhythmia  - frustrated about not driving, dysequilibrium. We discussed. I reiterated no driving. I  also expressed that we are all on the same team and trying to help him out.   Close follow up, Dr. Lurline Del team.   For questions or updates, please contact [facility] Please consult [email or web address] for contact info under Cardiology/STEMI.      Signed, Candee Furbish, MD  04/03/2017, 8:20 AM

## 2017-04-03 NOTE — Progress Notes (Signed)
TR BAND REMOVAL  LOCATION:    right radial  DEFLATED PER PROTOCOL:    Yes.    TIME BAND OFF / DRESSING APPLIED:    2130   SITE UPON ARRIVAL:    Level 0  SITE AFTER BAND REMOVAL:    Level 0  CIRCULATION SENSATION AND MOVEMENT:    Within Normal Limits   Yes.    COMMENTS:   Pt.tolerated well.Post TR band instructions given.

## 2017-04-03 NOTE — Progress Notes (Addendum)
CARDIAC REHAB PHASE I   PRE:  Rate/Rhythm: 56 SB  BP:  Sitting: 157/67      SaO2: 97% RA   MODE:  Ambulation: 1000 ft   POST:  Rate/Rhythm: 76 SR   BP:  Sitting: 170/75      Recheck: 151/64      SaO2: 98%  Pt in bed. Pt willing to walk. BP and O2 stat were checked. Waited while pt changed into pants and shoes. Pt ambulated 1000 ft with steady gait, no assistance. Pt denied any complaints of CP, dizziness or SOB. Pt returned to bed per request. BP was checked again and was slightly elevated. Begin to teach education. Reviewed risk factors, exercise guidelines, weather precautions, restrictions, PTCA/Stent Booklet, stent card, NTG use, heart healthy diet, and chewing tobacco cessation. BP was rechecked and it came down to 151/64. Discussed Cardiac Rehab with pt. Will send referral to [facility] per pt request.   [phone]  Justin Lair MS, ACSM CEP  8:43 AM 04/03/2017

## 2017-04-03 NOTE — Discharge Instructions (Signed)
Call [place] at [phone] if any bleeding, swelling or drainage at cath site.  May shower, no tub baths for 48 hours for groin sticks. No lifting over 5 pounds for 3 days.  No Driving.  Heart Healthy Diet   Keep appt to check kidneys at [facility] office.    Keep follow with Kerin Ransom,  PA-C   No further lisinopril.  Begin entresto new medication on 04/05/17 twice a day.    So Not Stop  ASA or PLAVIX, stopping could cause a heart attack.    Take 1 NTG, under your tongue, while sitting.  If no relief of pain may repeat NTG, one tab every 5 minutes up to 3 tablets total over 15 minutes.  If no relief CALL 911.  If you have dizziness/lightheadness  while taking NTG, stop taking and call 911.

## 2017-04-03 NOTE — Discharge Summary (Addendum)
Discharge Summary    Patient ID: Justin [name],  MRN: [id number], DOB/AGE: 1950-05-13 67 y.o.  Admit date: 04/02/2017 Discharge date: 04/03/2017  Primary Care Provider: Heinz [name] Primary Cardiologist: Justin Klein, MD  Discharge Diagnoses    Principal Problem:   Abnormal echocardiogram Active Problems:   CAD S/P percutaneous coronary angioplasty 04/02/17 DES to RCA   Hyperlipidemia   Dilated cardiomyopathy ([place])   Chronic combined systolic and diastolic heart failure ([place])   History of CVA (cerebrovascular accident)   Allergies No Known Allergies  Diagnostic Studies/Procedures    Procedures   CORONARY STENT INTERVENTION  INTRAVASCULAR PRESSURE WIRE/FFR STUDY  LEFT HEART CATH AND CORONARY ANGIOGRAPHY  Conclusion     The left ventricular systolic function is normal.  Prox RCA-1 lesion is 70% stenosed. Prox RCA-2 lesion is 50% stenosed. -- FFR 0.8  A drug-eluting stent was successfully placed using a STENT SIERRA 2.75 X 18 MM.  Post intervention, there is a 0% residual stenosis.  Dist RCA lesion is 20% stenosed.  Mid LM to Dist LM lesion is 30% stenosed. Prox Cx lesion is 40% stenosed.  LV end diastolic pressure is mildly elevated.   FFR + pRCA lesion treated with single DES --> otherwise essentially diffuse mild CAD elsewhere with 30% LM. Mildly elevated LVEDP.  Plan:  Overnight observation and post procedure unit with TR band removal.  Recommend 1 year of DAPT, but would be okay to stop aspirin after 6 months if clinically indicated.  Continue aggressive risk factor modification with beta-blocker, statin and ARB/ACE I   Anticipate discharge tomorrow.  Follow-up with Justin [name]    _____________   History of Present Illness     67 y.o. male with a hx of recent hospitalization in January 4098 for embolic stroke involving the left basal ganglia, left parieto-occipital cortex and right cerebellar hemisphere, felt to be embolic.  He  underwent transesophageal echo which did not show embolic source and had a loop recorder placed.  However the echo evaluation showed evidence of moderate left ventricular systolic dysfunction with an ejection fraction of 35%, mild dilation of the left ventricular chamber and regional wall motion abnormalities with akinesis of the entire distribution of the right coronary artery and grade 2 diastolic dysfunction.  The echo abnormalities were new when compared to a study from 2010.  He has occasional unpleasant numb feeling/slight discomfort in the left precordial area, almost toward the axillary line.  This is not associated with physical activity or any other trigger event that he can think of.  He does not develop this discomfort when he is walking.  Retrospectively, his exercise tolerance and stamina have diminished for at least several months prior to his stroke.  He does not have edema, orthopnea or PND.  He has not had any new neurological dysfunction since his hospital discharge.  He has not had any bleeding problems.  He denies intermittent claudication or syncope.  He is not aware of any palpitations.  So far his loop recorder has not disclosed any arrhythmia.  With abnormal echo concern for RCA disease leading to cardiomyopathy.  Unsure if the chest numbness is angina.  Justin [name] recommended cardiac cath to fully evaluate.    Pt presented 04/02/17 for cardiac cath.  + PCI and was kept overnight.     Hospital Course     Consultants:none  Pt with cath found to have 70% RCA stenosis and FFR was 0.8 He underwent PCI wit DES [place].  This  was successful.  He does have residual disease of 30% LM, distal RCA 20%, LCX of 40%.     He was admitted to [facility] overnight and by the next AM was stable for discharge by Dr. Marlou [name] exam.   No Driving.  He will continue on ASA and plavix for 1 year. but would be okay to stop aspirin after 6 months if clinically indicated  For his dilated  cardiomyopathy/chronic systolic HF, he is on ACE -he has renal dopplers scheduled for the 12th of this month.  We will stop lisinopril and begin Entresto 04/05/17 at lowest dose after ACE washout--last dose of lisinopril was 04/02/17 at 8:00PM. Will check BMP as outpt.  Pt ambulated with cardiac rehab and was referred for phase II.    _____________  Discharge Vitals Blood pressure 132/64, pulse (!) 56, temperature 98 F (36.7 C), temperature source Oral, resp. rate 14, height 5\' 6"  (1.676 m), weight 163 lb 2.3 oz (74 kg), SpO2 98 %.  Filed Weights   04/02/17 1237 04/03/17 0610  Weight: 165 lb (74.8 kg) 163 lb 2.3 oz (74 kg)    Labs & Radiologic Studies    CBC Recent Labs    04/01/17 1118 04/03/17 0331  WBC 5.0 5.1  HGB 15.2 12.9*  HCT 43.8 38.1*  MCV 82 85.6  PLT 182 932   Basic Metabolic Panel Recent Labs    04/01/17 1118 04/03/17 0331  NA 141 138  K 4.9 3.9  CL 104 106  CO2 28 25  GLUCOSE 112* 166*  BUN 13 13  CREATININE 0.91 1.01  CALCIUM 9.7 8.4*   Liver Function Tests No results for input(s): AST, ALT, ALKPHOS, BILITOT, PROT, ALBUMIN in the last 72 hours. No results for input(s): LIPASE, AMYLASE in the last 72 hours. Cardiac Enzymes No results for input(s): CKTOTAL, CKMB, CKMBINDEX, TROPONINI in the last 72 hours. BNP Invalid input(s): POCBNP D-Dimer No results for input(s): DDIMER in the last 72 hours. Hemoglobin A1C No results for input(s): HGBA1C in the last 72 hours. Fasting Lipid Panel No results for input(s): CHOL, HDL, LDLCALC, TRIG, CHOLHDL, LDLDIRECT in the last 72 hours. Thyroid Function Tests No results for input(s): TSH, T4TOTAL, T3FREE, THYROIDAB in the last 72 hours.  Invalid input(s): FREET3 _____________  No results found. Disposition   Pt is being discharged home today in good condition.  Follow-up Plans & Appointments   Call [facility] at [phone] if any bleeding, swelling or drainage at cath site.  May  shower, no tub baths for 48 hours for groin sticks. No lifting over 5 pounds for 3 days.  No Driving.  Heart Healthy Diet   Keep appt to check kidneys at [facility] office.    Keep follow with Kerin Ransom,  PA-C   No further lisinopril.  Begin entresto new medication on 04/05/17 twice a day.    Take 1 NTG, under your tongue, while sitting.  If no relief of pain may repeat NTG, one tab every 5 minutes up to 3 tablets total over 15 minutes.  If no relief CALL 911.  If you have dizziness/lightheadness  while taking NTG, stop taking and call 911.       Follow-up Information    Croitoru, Mihai, MD Follow up on 04/10/2017.   Specialty:  Cardiology Why:  at 7:45 for 8 AM appt with his PA [facility] information: [address] [place] [address] [phone]        [facility] Follow up on 04/08/2017.   Specialty:  Cardiology Why:  for renal ultrasound at 9:00AM come 15 min early Contact information: [address] [place] [phone]         Discharge Instructions    AMB Referral to Cardiac Rehabilitation - Phase II   Complete by:  As directed    Diagnosis:  Coronary Stents   Amb Referral to Cardiac Rehabilitation   Complete by:  As directed    Diagnosis:  Coronary Stents      Discharge Medications   Allergies as of 04/03/2017   No Known Allergies     Medication List    STOP taking these medications   aspirin 325 MG EC tablet Replaced by:  aspirin 81 MG chewable tablet   lisinopril 40 MG tablet Commonly known as:  PRINIVIL,ZESTRIL     TAKE these medications   amLODipine 5 MG tablet Commonly known as:  NORVASC Take 1 tablet (5 mg total) by mouth daily.   aspirin 81 MG chewable tablet Chew 1 tablet (81 mg total) by mouth daily. Replaces:  aspirin 325 MG EC tablet   atorvastatin 40 MG tablet Commonly known as:  LIPITOR Take 1 tablet (40 mg total) by mouth daily at 6 PM.   carvedilol  6.25 MG tablet Commonly known as:  COREG Take 1 tablet (6.25 mg total) by mouth 2 (two) times daily with a meal.   clopidogrel 75 MG tablet Commonly known as:  PLAVIX Take 1 tablet (75 mg total) by mouth daily.   nitroGLYCERIN 0.4 MG SL tablet Commonly known as:  NITROSTAT Place 1 tablet (0.4 mg total) under the tongue every 5 (five) minutes as needed for chest pain.   PRESERVISION AREDS 2 Caps Take 2 capsules by mouth daily.   sacubitril-valsartan 24-26 MG Commonly known as:  ENTRESTO Take 1 tablet by mouth 2 (two) times daily. Start taking on:  04/05/2017        Aspirin prescribed at discharge?  Yes High Intensity Statin Prescribed? (Lipitor 40-80mg  or Crestor 20-40mg ): Yes Beta Blocker Prescribed? Yes For EF <40%, was ACEI/ARB Prescribed? Yes ADP Receptor Inhibitor Prescribed? (i.e. Plavix etc.-Includes Medically Managed Patients): Yes For EF <40%, Aldosterone Inhibitor Prescribed? No: plan outpt with titration of meds Was EF assessed during THIS hospitalization? Yes Was Cardiac Rehab II ordered? (Included Medically managed Patients): Yes   Outstanding Labs/Studies   BMP  Duration of Discharge Encounter   Greater than 30 minutes including physician time.  Signed, Cecilie Kicks NP 04/03/2017, 10:48 AM  Personally seen and examined. Agree with above.  Patient Profile     67 y.o. male both ischemic and non ischemic cardiomyopathy with CVA, EF 35%, CAD with DES to RCA FFR + proximal lesion  Assessment & Plan    CAD  - RCA DES prox. DAPT for one year (could consider Plavix longer with CVA).   - Cardiac rehab  Dilated cardiomyopathy/ chronic systolic heart failure  - As outpatient, we will change to Entresto mid dose. Stop lisinopril for 36 hours prior to start.   - LVEDP mildly elevated  CVA   - loop in place - no arrhythmia  - frustrated about not driving, dysequilibrium. We discussed. I reiterated no driving. I also expressed that we are all on the same  team and trying to help him out.   Close follow up   Candee Furbish, MD

## 2017-04-04 ENCOUNTER — Telehealth (HOSPITAL_COMMUNITY): Payer: Self-pay

## 2017-04-04 NOTE — Telephone Encounter (Signed)
Patients insurance is active and benefits verified through [facility] - $10.00 co-pay, no deductible, out of pocket amount of $3,400/$400.00 has been met, no co-insurance, and no pre-authorization is required. Spoke with Mcarthur Rossetti - reference [id number]  Patient will be contacted and scheduled upon review by the RN Navigator.

## 2017-04-07 ENCOUNTER — Encounter: Payer: Medicare HMO | Admitting: Physical Therapy

## 2017-04-08 ENCOUNTER — Ambulatory Visit (HOSPITAL_COMMUNITY)
Admission: RE | Admit: 2017-04-08 | Discharge: 2017-04-08 | Disposition: A | Discharge status: Home / Self Care | Payer: Medicare HMO | Source: Ambulatory Visit | Attending: Cardiovascular Disease | Admitting: Cardiovascular Disease

## 2017-04-08 ENCOUNTER — Telehealth (HOSPITAL_COMMUNITY): Payer: Self-pay

## 2017-04-08 DIAGNOSIS — I150 Renovascular hypertension: Secondary | ICD-10-CM

## 2017-04-08 DIAGNOSIS — R93421 Abnormal radiologic findings on diagnostic imaging of right kidney: Secondary | ICD-10-CM | POA: Insufficient documentation

## 2017-04-08 NOTE — Telephone Encounter (Signed)
Attempted to call patient in regards to Cardiac Rehab - lm on vm °

## 2017-04-10 ENCOUNTER — Encounter: Payer: Medicare HMO | Admitting: Physical Therapy

## 2017-04-10 ENCOUNTER — Ambulatory Visit: Payer: Medicare HMO | Admitting: Cardiology

## 2017-04-10 ENCOUNTER — Ambulatory Visit: Payer: Medicare HMO | Admitting: Physical Therapy

## 2017-04-10 ENCOUNTER — Encounter: Payer: Self-pay | Admitting: Cardiology

## 2017-04-10 VITALS — BP 144/80 | HR 92 | Ht 66.0 in | Wt 165.0 lb

## 2017-04-10 DIAGNOSIS — E782 Mixed hyperlipidemia: Secondary | ICD-10-CM

## 2017-04-10 DIAGNOSIS — I2510 Atherosclerotic heart disease of native coronary artery without angina pectoris: Secondary | ICD-10-CM | POA: Diagnosis not present

## 2017-04-10 DIAGNOSIS — I255 Ischemic cardiomyopathy: Secondary | ICD-10-CM | POA: Diagnosis not present

## 2017-04-10 DIAGNOSIS — Z9861 Coronary angioplasty status: Secondary | ICD-10-CM

## 2017-04-10 DIAGNOSIS — I10 Essential (primary) hypertension: Secondary | ICD-10-CM | POA: Diagnosis not present

## 2017-04-10 DIAGNOSIS — I639 Cerebral infarction, unspecified: Secondary | ICD-10-CM

## 2017-04-10 LAB — BASIC METABOLIC PANEL
BUN/Creatinine Ratio: 18 (ref 10–24)
BUN: 16 mg/dL (ref 8–27)
CO2: 24 mmol/L (ref 20–29)
Calcium: 9.3 mg/dL (ref 8.6–10.2)
Chloride: 103 mmol/L (ref 96–106)
Creatinine, Ser: 0.88 mg/dL (ref 0.76–1.27)
GFR calc Af Amer: 102 mL/min/{1.73_m2} (ref >59)
GFR calc non Af Amer: 88 mL/min/{1.73_m2} (ref >59)
Glucose: 144 mg/dL — ABNORMAL HIGH (ref 65–99)
Potassium: 4.9 mmol/L (ref 3.5–5.2)
Sodium: 140 mmol/L (ref 134–144)

## 2017-04-10 NOTE — Progress Notes (Signed)
04/10/2017 Justin [name]   11/08/1950  [id number]  Primary Physician Heinz Knuckles, MD Primary Cardiologist: Dr [name]  HPI:  67 y/o male followed by Dr [name] with a history of HTN, s/p CVA Jan 2019 (unknown source) and s/p loop recorder. When he had his CVA an echo showed LVD with an EF of 35-40% with an inferior WMA. He was admitted for diagnostic cath 04/02/17. This showed a 70% RCA, FFR was 0.8 and he underwent PCI with DES. He is in the office today for follow up. He has "good days and bad days". Main complaint is lack of stamina. His B/P is running 145/85 average. He denies chest pain or SOB. He was started on Entresto at discharge.    Current Outpatient Medications  Medication Sig Dispense Refill  . amLODipine (NORVASC) 5 MG tablet Take 1 tablet (5 mg total) by mouth daily. 90 tablet 3  . aspirin 81 MG chewable tablet Chew 1 tablet (81 mg total) by mouth daily.    Marland Kitchen atorvastatin (LIPITOR) 40 MG tablet Take 1 tablet (40 mg total) by mouth daily at 6 PM. 90 tablet 3  . carvedilol (COREG) 6.25 MG tablet Take 1 tablet (6.25 mg total) by mouth 2 (two) times daily with a meal. 180 tablet 3  . clopidogrel (PLAVIX) 75 MG tablet Take 1 tablet (75 mg total) by mouth daily. 90 tablet 3  . Multiple Vitamins-Minerals (PRESERVISION AREDS 2) CAPS Take 2 capsules by mouth daily.    . nitroGLYCERIN (NITROSTAT) 0.4 MG SL tablet Place 1 tablet (0.4 mg total) under the tongue every 5 (five) minutes as needed for chest pain. 25 tablet 4  . sacubitril-valsartan (ENTRESTO) 24-26 MG Take 1 tablet by mouth 2 (two) times daily. 60 tablet 0   No current facility-administered medications for this visit.     No Known Allergies  Past Medical History:  Diagnosis Date  . Abnormal echocardiogram 04/03/2017  . CAD S/P percutaneous coronary angioplasty 04/02/17 DES to RCA 04/02/2017  . Calcium oxalate renal stones   . Chewing tobacco use   . Gallstones    hx/o, s/p choleycystectomy  . H/O echocardiogram  10/2008   no wall thickness, EF 55%, mild mitral regurge  . Hyperlipidemia   . Hypertension    hx/o, noncompliance prior with medication  . Influenza vaccine refused 09/2013  . Rheumatic fever    age 60  . Stroke ([place]) 10/02/2008  . Tinnitus     Social History   Socioeconomic History  . Marital status: Single    Spouse name: Not on file  . Number of children: 2  . Years of education: Not on file  . Highest education level: Not on file  Social Needs  . Financial resource strain: Not on file  . Food insecurity - worry: Not on file  . Food insecurity - inability: Not on file  . Transportation needs - medical: Not on file  . Transportation needs - non-medical: Not on file  Occupational History    Employer: [employer]  Tobacco Use  . Smoking status: Never Smoker  . Smokeless tobacco: Current User    Types: Chew  Substance and Sexual Activity  . Alcohol use: No    Alcohol/week: 0.0 oz  . Drug use: No  . Sexual activity: Not on file  Other Topics Concern  . Not on file  Social History Narrative   Single, has 2 sons, 34yo and 27yo, electrician, walks regularly, 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  Family History  Problem Relation Age of Onset  . Heart disease Mother 23       MI  . Migraines Mother   . Cirrhosis Brother   . Alcohol abuse Father        lived to age 40  . Diabetes Brother   . Colon cancer Neg Hx   . Cancer Neg Hx   . Stroke Neg Hx   . Hypertension Neg Hx   . Hyperlipidemia Neg Hx      Review of Systems: General: negative for chills, fever, night sweats or weight changes.  Cardiovascular: negative for chest pain, dyspnea on exertion, edema, orthopnea, palpitations, paroxysmal nocturnal dyspnea or shortness of breath Dermatological: negative for rash Respiratory: negative for cough or wheezing Urologic: negative for hematuria Abdominal: negative for nausea, vomiting, diarrhea, bright red blood per rectum, melena, or  hematemesis Neurologic: negative for visual changes, syncope, or dizziness All other systems reviewed and are otherwise negative except as noted above.    Blood pressure (!) 144/80, pulse 92, height 5\' 6"  (1.676 m), weight 165 lb (74.8 kg), SpO2 95 %.  General appearance: alert, cooperative and no distress Neck: no carotid bruit and no JVD Lungs: clear to auscultation bilaterally Heart: regular rate and rhythm Extremities: extremities normal, atraumatic, no cyanosis or edema Skin: Skin color, texture, turgor normal. No rashes or lesions Neurologic: Grossly normal   ASSESSMENT AND PLAN:   CAD S/P percutaneous coronary angioplasty  04/02/17 DES to RCA, moderate (40%) CFX  Essential hypertension B/P still high. Renal dopplers normal.  Hyperlipidemia On high dose statin Rx  Ischemic cardiomyopathy EF 35-40% pre RCA PCI   PLAN  Long discussion with the pt and his wife about medications, he is not a fan. I explained our goal is to prevent progression of his CAD and further complications secondary to uncontrolled HTN. Check BMP today. F/U B/P in a week and increase Entresto if possible. F/U Dr [name] in 3 months. He can drive from cardiology standpoint.   Kerin Ransom PA-C 04/10/2017 8:36 AM

## 2017-04-10 NOTE — Assessment & Plan Note (Signed)
On high dose statin Rx 

## 2017-04-10 NOTE — Patient Instructions (Addendum)
Medication Instructions:  Your physician recommends that you continue on your current medications as directed. Please refer to the Current Medication list given to you today.  Labwork: Your physician recommends that you return for lab work in: TODAY-BMP  Testing/Procedures: NONE   Follow-Up: 1. Your physician recommends that you schedule a follow-up appointment in: 3 months with Dr [name]. 2. Your physician recommends that you schedule a follow-up appointment in: 1wk with PharmD-increase Entresto  Any Other Special Instructions Will Be Listed Below (If Applicable).  If you need a refill on your cardiac medications before your next appointment, please call your pharmacy.

## 2017-04-10 NOTE — Progress Notes (Signed)
Thank you MCr 

## 2017-04-10 NOTE — Assessment & Plan Note (Signed)
04/02/17 DES to RCA, moderate (40%) CFX

## 2017-04-10 NOTE — Assessment & Plan Note (Signed)
B/P still high. Renal dopplers normal.

## 2017-04-10 NOTE — Assessment & Plan Note (Signed)
EF 35-40% pre RCA PCI

## 2017-04-15 ENCOUNTER — Telehealth (HOSPITAL_COMMUNITY): Payer: Self-pay

## 2017-04-15 ENCOUNTER — Encounter (HOSPITAL_COMMUNITY): Payer: Self-pay

## 2017-04-15 ENCOUNTER — Telehealth (HOSPITAL_COMMUNITY): Payer: Self-pay | Admitting: *Deleted

## 2017-04-15 NOTE — Telephone Encounter (Signed)
2nd attempt to call patient in regards to Cardiac Rehab - lm on vm. Sending letter. °

## 2017-04-15 NOTE — Telephone Encounter (Signed)
Pt returned call to cardiac rehab.  Reviewed with pt the benefits of doing cardiac rehab.  Pt did not feel the exercise program would work for him since he was already walking 25-30 minutes.  Pt declined to participate. Cherre Huger, BSN Cardiac and Training and development officer

## 2017-04-23 NOTE — Progress Notes (Signed)
04/24/2017 Justin [name] February 13, 1950 [id number]   HPI:  Justin [name] is a 67 y.o. male patient of Dr. [name] , with a PMH below who presents today for heart failure medication titration.  He was admitted to [facility] on Jan 29 for a CVA.  He had noted intermittent visual disturbances since Christmas and was sent to ER by his ophthalmologist.  During hospitalization a loop recorder was placed and an echocardiogram revealed an EF of 35%.  He was discharged two days later to rehab where he stayed for a week before being discharged to home.  He followed up on the HF diagnosis with Dr. [name] on March 5 and it was recommended that he undergo coronary angiography.  On March 6 a heart cath was done, he had a DES placed in the RCA.  He was discharged with Entresto 24/26 mg.  Shortly after that a renal doppler showed normal renal arteries.  He followed up with Kerin Ransom on March 14, who noted patient was "not a fan" of medications, although I am not sure if it is medication in general or just one specific.   No medication changes were made at that time and patient was asked to follow up with CVRR for further dose titrations.    He presents today for follow up.    Blood Pressure Goal:  130/80  Current Medications:  Cardiac Hx:  Family Hx:  Mother died from MI at age 67  Father died from 59 "old age"  32 brother - 1 deceased with etoh  2 sons with hypertension  Social Hx:  Chewing tobacco 1 can q3d; no alcohol; coffee daily, unsweet tea;  Diet:  Mostly home cooked; very little added salt with cooking, none at table;   Exercise:  Walks daily 1/2 mile once to twice daily  Home BP readings:  Home BP cuff, bought in 2010;  Checked at PT last month, was told WNL  15 home readings in past 2 weeks, average 148/87 with HR 68.  BP range 140-164/83-90  Intolerances:   nkda  Labs:  04/10/17:  Na 140, K 4.9, Glu 144, BUN 16, SCr 0.88.  CrCl = 85  Wt Readings from Last 3 Encounters:   04/24/17 166 lb 3.2 oz (75.4 kg)  04/10/17 165 lb (74.8 kg)  04/03/17 163 lb 2.3 oz (74 kg)   BP Readings from Last 3 Encounters:  04/24/17 (!) 150/88  04/10/17 (!) 144/80  04/03/17 132/64   Pulse Readings from Last 3 Encounters:  04/24/17 64  04/10/17 92  04/03/17 (!) 56    Current Outpatient Medications  Medication Sig Dispense Refill  . amLODipine (NORVASC) 5 MG tablet Take 1 tablet (5 mg total) by mouth daily. 90 tablet 3  . aspirin 81 MG chewable tablet Chew 1 tablet (81 mg total) by mouth daily.    Marland Kitchen atorvastatin (LIPITOR) 40 MG tablet Take 1 tablet (40 mg total) by mouth daily at 6 PM. 90 tablet 3  . carvedilol (COREG) 6.25 MG tablet Take 1 tablet (6.25 mg total) by mouth 2 (two) times daily with a meal. 180 tablet 3  . clopidogrel (PLAVIX) 75 MG tablet Take 1 tablet (75 mg total) by mouth daily. 90 tablet 3  . Multiple Vitamins-Minerals (PRESERVISION AREDS 2) CAPS Take 2 capsules by mouth daily.    . nitroGLYCERIN (NITROSTAT) 0.4 MG SL tablet Place 1 tablet (0.4 mg total) under the tongue every 5 (five) minutes as needed for chest pain.  25 tablet 4  . sacubitril-valsartan (ENTRESTO) 49-51 MG Take 1 tablet by mouth 2 (two) times daily. 28 tablet 0   No current facility-administered medications for this visit.     No Known Allergies  Past Medical History:  Diagnosis Date  . Abnormal echocardiogram 04/03/2017  . CAD S/P percutaneous coronary angioplasty 04/02/17 DES to RCA 04/02/2017  . Calcium oxalate renal stones   . Chewing tobacco use   . Gallstones    hx/o, s/p choleycystectomy  . H/O echocardiogram 10/2008   no wall thickness, EF 55%, mild mitral regurge  . Hyperlipidemia   . Hypertension    hx/o, noncompliance prior with medication  . Influenza vaccine refused 09/2013  . Rheumatic fever    age 3  . Stroke ([place]) 10/02/2008  . Tinnitus     Blood pressure (!) 150/88, pulse 64, height 5\' 6"  (1.676 m), weight 166 lb 3.2 oz (75.4 kg).  Standing BP 162/94  Chronic  combined systolic and diastolic heart failure ([place]) Patient with chronic heart failure, EF 35%.  Has been tolerating Entresto 24/26 mg without any concerns.  Patient does not like taking medications and is hesitant to make changes or increase doses, but wife appears to be understanding of the need for this.  Will increase the Entresto to 49/51 mg twice daily and have patient return in 2 weeks for follow up.   He will need to continue with daily home BP checks and was encouraged to continue increasing his exercise as tolerated.     Tommy Medal PharmD CPP [facility] [address] [place], [place] [address] [phone]

## 2017-04-24 ENCOUNTER — Ambulatory Visit (INDEPENDENT_AMBULATORY_CARE_PROVIDER_SITE_OTHER): Payer: Medicare HMO | Admitting: Pharmacist Clinician (PhC)/ Clinical Pharmacy Specialist

## 2017-04-24 DIAGNOSIS — I5042 Chronic combined systolic (congestive) and diastolic (congestive) heart failure: Secondary | ICD-10-CM

## 2017-04-24 MED ORDER — SACUBITRIL-VALSARTAN 49-51 MG PO TABS: 1.0000 | ORAL_TABLET | Freq: Two times a day (BID) | ORAL | 0 refills | Status: DC

## 2017-04-24 NOTE — Patient Instructions (Signed)
Return for a a follow up appointment in 2 weeks  Your blood pressure today is 150/88  Check your blood pressure at home daily and keep record of the readings.  Take your BP meds as follows:  Increase Entresto to 49/51 mg twice daily  Continue all other medications  Bring all of your meds, your BP cuff and your record of home blood pressures to your next appointment.  Exercise as you're able, try to walk approximately 30 minutes per day.  Keep salt intake to a minimum, especially watch canned and prepared boxed foods.  Eat more fresh fruits and vegetables and fewer canned items.  Avoid eating in fast food restaurants.    HOW TO TAKE YOUR BLOOD PRESSURE: . Rest 5 minutes before taking your blood pressure. .  Don't smoke or drink caffeinated beverages for at least 30 minutes before. . Take your blood pressure before (not after) you eat. . Sit comfortably with your back supported and both feet on the floor (don't cross your legs). . Elevate your arm to heart level on a table or a desk. . Use the proper sized cuff. It should fit smoothly and snugly around your bare upper arm. There should be enough room to slip a fingertip under the cuff. The bottom edge of the cuff should be 1 inch above the crease of the elbow. . Ideally, take 3 measurements at one sitting and record the average.

## 2017-04-24 NOTE — Assessment & Plan Note (Signed)
Patient with chronic heart failure, EF 35%.  Has been tolerating Entresto 24/26 mg without any concerns.  Patient does not like taking medications and is hesitant to make changes or increase doses, but wife appears to be understanding of the need for this.  Will increase the Entresto to 49/51 mg twice daily and have patient return in 2 weeks for follow up.   He will need to continue with daily home BP checks and was encouraged to continue increasing his exercise as tolerated.

## 2017-04-24 NOTE — Progress Notes (Signed)
Thank you. Will work on getting him to join the [employer]. MCr

## 2017-04-30 ENCOUNTER — Ambulatory Visit: Payer: Self-pay | Admitting: Adult Health

## 2017-04-30 ENCOUNTER — Other Ambulatory Visit: Payer: Self-pay | Admitting: Cardiology

## 2017-05-01 ENCOUNTER — Ambulatory Visit (INDEPENDENT_AMBULATORY_CARE_PROVIDER_SITE_OTHER): Payer: Medicare HMO | Admitting: *Deleted

## 2017-05-01 DIAGNOSIS — I639 Cerebral infarction, unspecified: Secondary | ICD-10-CM | POA: Diagnosis not present

## 2017-05-05 LAB — CUP PACEART REMOTE DEVICE CHECK
Date Time Interrogation Session: 20190302211033
MDC IDC PG IMPLANT DT: 20190131

## 2017-05-05 NOTE — Progress Notes (Signed)
Carelink Summary Report / Loop Recorder 

## 2017-05-06 ENCOUNTER — Other Ambulatory Visit: Payer: Self-pay | Admitting: Cardiology

## 2017-05-07 ENCOUNTER — Telehealth: Payer: Self-pay | Admitting: Cardiovascular Disease

## 2017-05-07 NOTE — Telephone Encounter (Signed)
New Message    Patient calling the office for samples of medication:   1.  What medication and dosage are you requesting samples for? entresto   2.  Are you currently out of this medication? Will be out Monday

## 2017-05-13 ENCOUNTER — Ambulatory Visit (INDEPENDENT_AMBULATORY_CARE_PROVIDER_SITE_OTHER): Payer: Medicare HMO | Admitting: Pharmacist Clinician (PhC)/ Clinical Pharmacy Specialist

## 2017-05-13 ENCOUNTER — Encounter: Payer: Self-pay | Admitting: Pharmacist Clinician (PhC)/ Clinical Pharmacy Specialist

## 2017-05-13 DIAGNOSIS — I5042 Chronic combined systolic (congestive) and diastolic (congestive) heart failure: Secondary | ICD-10-CM | POA: Diagnosis not present

## 2017-05-13 NOTE — Progress Notes (Signed)
Thanks MCr 

## 2017-05-13 NOTE — Patient Instructions (Addendum)
Return for a a follow up appointment in 3 weeks  Your blood pressure today is 140/82   Check your blood pressure at home daily and keep record of the readings.  Take your BP meds as follows:  Increase Entresto to 97/103 mg twice daily.  Take 2 of the Entresto 49/51 mg tabs twice daily until gone, then start the new tablets.   Continue with all other medications  Bring all of your meds, your BP cuff and your record of home blood pressures to your next appointment.  Exercise as you're able, try to walk approximately 30 minutes per day.  Keep salt intake to a minimum, especially watch canned and prepared boxed foods.  Eat more fresh fruits and vegetables and fewer canned items.  Avoid eating in fast food restaurants.    HOW TO TAKE YOUR BLOOD PRESSURE: . Rest 5 minutes before taking your blood pressure. .  Don't smoke or drink caffeinated beverages for at least 30 minutes before. . Take your blood pressure before (not after) you eat. . Sit comfortably with your back supported and both feet on the floor (don't cross your legs). . Elevate your arm to heart level on a table or a desk. . Use the proper sized cuff. It should fit smoothly and snugly around your bare upper arm. There should be enough room to slip a fingertip under the cuff. The bottom edge of the cuff should be 1 inch above the crease of the elbow. . Ideally, take 3 measurements at one sitting and record the average.

## 2017-05-13 NOTE — Progress Notes (Signed)
05/13/2017 MASYN ROSTRO 1950/03/16 [id number]   HPI:  PRUDENCIO VELAZCO is a 67 y.o. male patient of Dr. [name] , with a PMH below who presents today for heart failure medication titration.  He was admitted to [facility] on Jan 29 for a CVA.  He had noted intermittent visual disturbances since Christmas and was sent to ER by his ophthalmologist.  During hospitalization a loop recorder was placed and an echocardiogram revealed an EF of 35%.  He was discharged two days later to rehab where he stayed for a week before being discharged to home.  He followed up on the HF diagnosis with Dr. [name] on March 5 and it was recommended that he undergo coronary angiography.  On March 6 a heart cath was done, he had a DES placed in the RCA.  He was discharged with Entresto 24/26 mg.  Shortly after that a renal doppler showed normal renal arteries.  He followed up with Kerin Ransom on March 14, who noted patient was "not a fan" of medications, although I am not sure if it is medication in general or just one specific.   No medication changes were made at that time and patient was asked to follow up with CVRR for further dose titrations.    He presents today for follow up.  He has not had any problems with the [facility], but reports that he doesn't have the energy to get up and do things.  Apparently once he gets up, he's fine, because he noted that he used the weed-eater the other day and had to fill the gas tank 4 times before he finished the area he was working in.  Today he reports some numbness and tightness across his chest, but no shortness of breath, either resting or exertional.  He does note that he has had at least 3 mimi-storkes Took all but entresto this am Some numbness/dull heavy pain in chest.  Some tightness across chest, but no difficulty breathing.  Believes had 3 TIA/strokes in past 2 weeks.  States he will get dizzy and off kilter for about 15-20 seconds, then feels "normal" again, but  will be tired for the rest of the day.  He was encouraged to follow up with neurology about this.      Blood Pressure Goal:  130/80  Current Medications:  Carvedilol 6.25 mg bid  Entresto 49/51 mg bid  (amlodipine 5 mg currently on hold)  Family Hx:  Mother died from MI at age 29  Father died from 50 "old age"  92 brother - 1 deceased with etoh  2 sons with hypertension  Social Hx:  Chewing tobacco 1 can q3d; no alcohol; coffee daily, unsweet tea;   Diet:  Mostly home cooked; very little added salt with cooking, none at table;   Exercise:  Walks daily 1/2 mile once to twice daily  Home BP readings:  Home BP cuff, bought in 2010 - Viv[employer] brand, wrist cuff.  It read 10 points higher (both systolic and diastolic) consistently with 2 different tests.  Home readings (19) averaged 143/88 with a range of 128-158/76-100.    Intolerances:   nkda  Labs:  04/10/17:  Na 140, K 4.9, Glu 144, BUN 16, SCr 0.88.  CrCl = 85  Wt Readings from Last 3 Encounters:  04/24/17 166 lb 3.2 oz (75.4 kg)  04/10/17 165 lb (74.8 kg)  04/03/17 163 lb 2.3 oz (74 kg)   BP Readings from Last 3 Encounters:  05/13/17 140/82  04/24/17 (!) 150/88  04/10/17 (!) 144/80   Pulse Readings from Last 3 Encounters:  05/13/17 60  04/24/17 64  04/10/17 92    Current Outpatient Medications  Medication Sig Dispense Refill  . aspirin 81 MG chewable tablet Chew 1 tablet (81 mg total) by mouth daily.    Marland Kitchen atorvastatin (LIPITOR) 40 MG tablet Take 1 tablet (40 mg total) by mouth daily at 6 PM. 90 tablet 3  . carvedilol (COREG) 6.25 MG tablet Take 1 tablet (6.25 mg total) by mouth 2 (two) times daily with a meal. 180 tablet 3  . clopidogrel (PLAVIX) 75 MG tablet Take 1 tablet (75 mg total) by mouth daily. 90 tablet 3  . Multiple Vitamins-Minerals (PRESERVISION AREDS 2) CAPS Take 2 capsules by mouth daily.    . nitroGLYCERIN (NITROSTAT) 0.4 MG SL tablet Place 1 tablet (0.4 mg total) under the tongue every 5 (five)  minutes as needed for chest pain. 25 tablet 4  . sacubitril-valsartan (ENTRESTO) 49-51 MG Take 1 tablet by mouth 2 (two) times daily. 28 tablet 0   No current facility-administered medications for this visit.     No Known Allergies  Past Medical History:  Diagnosis Date  . Abnormal echocardiogram 04/03/2017  . CAD S/P percutaneous coronary angioplasty 04/02/17 DES to RCA 04/02/2017  . Calcium oxalate renal stones   . Chewing tobacco use   . Gallstones    hx/o, s/p choleycystectomy  . H/O echocardiogram 10/2008   no wall thickness, EF 55%, mild mitral regurge  . Hyperlipidemia   . Hypertension    hx/o, noncompliance prior with medication  . Influenza vaccine refused 09/2013  . Rheumatic fever    age 73  . Stroke ([place]) 10/02/2008  . Tinnitus     Blood pressure 140/82, pulse 60.    Chronic combined systolic and diastolic heart failure ([place]) Patient has had improvement in BP since increasing the Entresto dose.  Because he is still not at goal BP and is doing well on the [facility], will increase his dose to 97/103.  Patient is hesitant about doubling the dose, but I explained that this is the way the medication was studied and is prescribed.  We cannot break the 49/51 mg tabs and do 1.5 tabs bid, as they are not scored and with 2 medications the 1/2 tablet would not actually be 1/2 of the active ingredients.  He agrees to take the increased dose and we will see him back in about 3 weeks for follow up.  At that time I will repeat BMET, and if his BP remains elevated, we can consider adding in a small dose of amlodipine again.  Patient was instructed to keep that bottle should we need the medication.     Tommy Medal PharmD CPP [facility] [address] [place], [place] [address] [phone]

## 2017-05-13 NOTE — Assessment & Plan Note (Signed)
Patient has had improvement in BP since increasing the Entresto dose.  Because he is still not at goal BP and is doing well on the [facility], will increase his dose to 97/103.  Patient is hesitant about doubling the dose, but I explained that this is the way the medication was studied and is prescribed.  We cannot break the 49/51 mg tabs and do 1.5 tabs bid, as they are not scored and with 2 medications the 1/2 tablet would not actually be 1/2 of the active ingredients.  He agrees to take the increased dose and we will see him back in about 3 weeks for follow up.  At that time I will repeat BMET, and if his BP remains elevated, we can consider adding in a small dose of amlodipine again.  Patient was instructed to keep that bottle should we need the medication.

## 2017-05-14 ENCOUNTER — Telehealth: Payer: Self-pay | Admitting: Cardiovascular Disease

## 2017-05-14 ENCOUNTER — Encounter: Payer: Self-pay | Admitting: Physical Medicine & Rehabilitation

## 2017-05-14 ENCOUNTER — Encounter: Payer: Medicare HMO | Attending: Physical Medicine & Rehabilitation | Admitting: Physical Medicine & Rehabilitation

## 2017-05-14 VITALS — BP 158/90 | HR 74 | Ht 66.0 in | Wt 170.0 lb

## 2017-05-14 DIAGNOSIS — I69319 Unspecified symptoms and signs involving cognitive functions following cerebral infarction: Secondary | ICD-10-CM

## 2017-05-14 DIAGNOSIS — I639 Cerebral infarction, unspecified: Secondary | ICD-10-CM | POA: Diagnosis not present

## 2017-05-14 DIAGNOSIS — Z87442 Personal history of urinary calculi: Secondary | ICD-10-CM | POA: Insufficient documentation

## 2017-05-14 DIAGNOSIS — Z8673 Personal history of transient ischemic attack (TIA), and cerebral infarction without residual deficits: Secondary | ICD-10-CM | POA: Insufficient documentation

## 2017-05-14 DIAGNOSIS — F17220 Nicotine dependence, chewing tobacco, uncomplicated: Secondary | ICD-10-CM | POA: Insufficient documentation

## 2017-05-14 DIAGNOSIS — Z9049 Acquired absence of other specified parts of digestive tract: Secondary | ICD-10-CM | POA: Diagnosis not present

## 2017-05-14 DIAGNOSIS — R350 Frequency of micturition: Secondary | ICD-10-CM | POA: Diagnosis not present

## 2017-05-14 DIAGNOSIS — E119 Type 2 diabetes mellitus without complications: Secondary | ICD-10-CM | POA: Insufficient documentation

## 2017-05-14 DIAGNOSIS — E785 Hyperlipidemia, unspecified: Secondary | ICD-10-CM | POA: Insufficient documentation

## 2017-05-14 DIAGNOSIS — I10 Essential (primary) hypertension: Secondary | ICD-10-CM

## 2017-05-14 MED ORDER — SACUBITRIL-VALSARTAN 97-103 MG PO TABS: 1.0000 | ORAL_TABLET | Freq: Two times a day (BID) | ORAL | 2 refills | Status: DC

## 2017-05-14 NOTE — Progress Notes (Signed)
Subjective:    Patient ID: Justin [name], male    DOB: 1950-02-23, 67 y.o.   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   Last clinic visit 03/14/17.  Wife present, who provides some of the history. Since that time, pt states his BP has been elevated.  His BP remains elevated in the office, but states it is normal at home.   His BP meds were adjusted recently.  He does not have an appointment with PCP.  He has not obtained glucometer or followed up with PCP. Urinary frequency has resolved. Denies falls.   Pain Inventory Average Pain 0 Pain Right Now 0 My pain is no pain  In the last 24 hours, has pain interfered with the following? General activity 0 Relation with others 0 Enjoyment of life 0 What TIME of day is your pain at its worst? no pain Sleep (in general) Fair  Pain is worse with: no pain Pain improves with: no pain Relief from Meds: no pain  Mobility walk without assistance how many minutes can you walk? 25 ability to climb steps?  no do you drive?  yes  Function retired  Neuro/Psych No problems in this area  Prior Studies Any changes since last visit?  no  Physicians involved in your care Any changes since last visit?  no   Family History  Problem Relation Age of Onset  . Heart disease Mother 66       MI  . Migraines Mother   . Cirrhosis Brother   . Alcohol abuse Father        lived to age 8  . Diabetes Brother   . Colon cancer Neg Hx   . Cancer Neg Hx   . Stroke Neg Hx   . Hypertension Neg Hx   . Hyperlipidemia Neg Hx    Social History   Socioeconomic History  . Marital status: Single    Spouse name: Not on file  . Number of children: 2  . Years of education: Not on file  . Highest education level: Not on  file  Occupational History    Employer: [employer]  . Financial resource strain: Not on file  . Food insecurity:    Worry: Not on file    Inability: Not on file  . Transportation needs:    Medical: Not on file    Non-medical: Not on file  Tobacco Use  . Smoking status: Never Smoker  . Smokeless tobacco: Current User    Types: Chew  Substance and Sexual Activity  . Alcohol use: No    Alcohol/week: 0.0 oz  . Drug use: No  . Sexual activity: Not on file  Lifestyle  . Physical activity:    Days per week: Not on file    Minutes per session: Not on file  . Stress: Not on file  Relationships  . Social connections:    Talks on phone: Not on file    Gets together: Not on file    Attends religious service: Not on file    Active member of club or organization: Not on file    Attends meetings of clubs or organizations: Not on file    Relationship status: Not on file  Other Topics Concern  . Not on file  Social  History Narrative   Single, has 2 sons, 68yo and 85yo, electrician, walks regularly, 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     Past Surgical History:  Procedure Laterality Date  . blood clot  1997   brain  . BRAIN SURGERY  1997   craniotomy, evacuation of hematoma  . CHOLECYSTECTOMY  09/06/2011   Procedure: LAPAROSCOPIC CHOLECYSTECTOMY WITH INTRAOPERATIVE CHOLANGIOGRAM;  Surgeon: Earnstine Regal, MD;  Location: [facility];  Service: General;  Laterality: N/A;  . COLONOSCOPY  01/04/14   normal. Dr. Owens Loffler  . CORONARY STENT INTERVENTION N/A 04/02/2017   Procedure: CORONARY STENT INTERVENTION;  Surgeon: Leonie Man, MD;  Location: [place] [facility];  Service: Cardiovascular;  Laterality: N/A;  . Dental implant    . INTRAVASCULAR PRESSURE WIRE/FFR STUDY N/A 04/02/2017   Procedure: INTRAVASCULAR PRESSURE WIRE/FFR STUDY;  Surgeon: Leonie Man, MD;  Location: [place] [facility];  Service: Cardiovascular;  Laterality: N/A;  . JOINT REPLACEMENT  2004    LEFT BICEP REPAIR, RTC repair  . LEFT HEART CATH AND CORONARY ANGIOGRAPHY N/A 04/02/2017   Procedure: LEFT HEART CATH AND CORONARY ANGIOGRAPHY;  Surgeon: Leonie Man, MD;  Location: [place] [facility];  Service: Cardiovascular;  Laterality: N/A;  . LOOP RECORDER INSERTION N/A 02/27/2017   Procedure: LOOP RECORDER INSERTION;  Surgeon: Sanda Klein, MD;  Location: [place] [facility];  Service: Cardiovascular;  Laterality: N/A;  . LOOP RECORDER INSERTION N/A 02/27/2017   Procedure: LOOP RECORDER INSERTION;  Surgeon: Pixie Casino, MD;  Location: [place];  Service: Cardiovascular;  Laterality: N/A;  . ROTATOR CUFF REPAIR     Right  . SIGMOIDOSCOPY    . TEE WITHOUT CARDIOVERSION N/A 02/27/2017   Procedure: TRANSESOPHAGEAL ECHOCARDIOGRAM (TEE);  Surgeon: Pixie Casino, MD;  Location: [facility];  Service: Cardiovascular;  Laterality: N/A;   Past Medical History:  Diagnosis Date  . Abnormal echocardiogram 04/03/2017  . CAD S/P percutaneous coronary angioplasty 04/02/17 DES to RCA 04/02/2017  . Calcium oxalate renal stones   . Chewing tobacco use   . Gallstones    hx/o, s/p choleycystectomy  . H/O echocardiogram 10/2008   no wall thickness, EF 55%, mild mitral regurge  . Hyperlipidemia   . Hypertension    hx/o, noncompliance prior with medication  . Influenza vaccine refused 09/2013  . Rheumatic fever    age 19  . Stroke ([place]) 10/02/2008  . Tinnitus    BP (!) 158/90   Pulse 74   Ht 5\' 6"  (1.676 m)   Wt 170 lb (77.1 kg)   SpO2 98%   BMI 27.44 kg/m   Opioid Risk Score:   Fall Risk Score:  `1  Depression screen PHQ 2/9  Depression screen [facility] 2/9 03/14/2017 04/01/2016 02/24/2015  Decreased Interest 0 0 0  Down, Depressed, Hopeless 0 0 0  PHQ - 2 Score 0 0 0  Altered sleeping 1 - -  Tired, decreased energy 1 - -  Change in appetite 0 - -  Feeling bad or failure about yourself  0 - -  Trouble concentrating 0 - -  Moving slowly or fidgety/restless 0 - -  Suicidal thoughts  0 - -  PHQ-9 Score 2 - -  Difficult doing work/chores Not difficult at all - -   Review of Systems  Constitutional: Negative.   HENT: Negative.   Eyes: Negative.   Respiratory: Negative.   Cardiovascular: Negative.   Gastrointestinal: Negative.   Endocrine: Negative.  High blood sugar  Genitourinary: Positive for frequency.  Musculoskeletal: Negative.   Skin: Negative.   Allergic/Immunologic: Negative.   Neurological: Negative.        Orthostasis  Hematological: Negative.   Psychiatric/Behavioral: Negative.   All other systems reviewed and are negative.     Objective:   Physical Exam Gen.: Well-developed, well-nourished. NAD. Head: Normocephalic. Atraumatic Eyes: EOM are normal. No discharge.  Cardiovascular: RRR and no JVD.  Respiratory: Effort normal breath sounds normal. No respiratory distress.  GI: Bowel sounds are normal. He exhibits no distension.  Musculoskeletal: He exhibits no edema or tenderness.  Neurological: He is alert and oriented.  Followed full commands Motor: RUE/RLE: 5/5 proximal to distal LUE/LLE: 4+-5/5 proximal to distal  Skin. Warm and dry Psych: Normal mood and behavior.     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   1. Cognitive deficits, dysesthesias, secondary to 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 Status post loop recorder.    Cont HEP  Follow up with Neurology, needs appointment  2. Hypertension.   Cont meds  Follow up with PCP, meds recently adjusted  3. Diet controlled diabetes mellitus.   Pt states he is going to follow up with PCP for glucometer to check CBGs, reminded again  4. Urinary frequency  Resolved

## 2017-05-14 NOTE — Progress Notes (Addendum)
[facility] [address] [place]. [place] [address] [phone]       OFFICE FOLLOW UP NOTE  Mr. Justin [name] Date of Birth:  Sep 25, 1950 Medical Record Number:  [id number]   Reason for Referral:  hospital stroke follow up  CHIEF COMPLAINT:  Chief Complaint  Patient presents with  . Follow-up    Hospital STroke follow up pt saw Dr.Xu in the hospital     HPI: Justin [name] is being seen today for initial visit in the office for multi infarct stroke on 02/25/17. History obtained from patient and chart review. Reviewed all radiology images and labs personally.  Justin [name] is a 67 year old male with history of HTN, stroke in 2010, traumatic ICH status post craniotomy in 1997, HLD admitted for double vision, difficulty walking, right lower quadrantanopia and intermittent weakness.  CT no acute abnormality.  However, MRI showed left BG, left MCA/PCA and right cerebellum patchy punctate infarcts.  Examination showed right lower quadrantanopia.  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  EF 35-40%.  LDL 139 and A1c 6.5.  Patient denies any cardiac issues in the past.  Denies palpitation. TEE unremarkable, no PFO. Loop recorder placed. Cardiology consulted for cardiomyopathy and possible cardiac cath. Patient stated that he has been on aspirin 81 several years ago, he re-started aspirin 81 for the last several days and recommended increasing asa to 325 and plavix 75.  D/c to CIR where he made good recovery. On 04/02/17, patient underwent cardiac cath with stent placement and recommendation of DAPT with aspirin and plavix for 12 months.   Since discharge, patient has been doing well and is accompanied at today's appointment with his wife.  He states that he no longer has consistent double vision or vision problems.  Intermittently he may have times where he cannot focus and feels disoriented and has to lay down  for couple hours after.  Denies losing consciousness or any other associated symptoms.  He does state these happen more after working majority of the day are already feeling fatigued. Denies headaches or hx of migraines. Cardiologist recently increased Entresto.  Patient does have history of seizures will like to hold off on EEG monitoring to see if they subside.  Continue to take aspirin and Plavix with minor bruising but no bleeding.  Continue to take Lipitor without side effects or myalgias.  There is blood pressure satisfactory 120/70.  Loop recorder has not shown atrial fibrillation last fall.  He does admit to snoring, daytime sleepiness, and frequent napping but declines sleep study due to not wanting to wear CPAP or use oral device.  Advised patient of increased risk of cardiac disease and stroke including increased blood pressure, increased cholesterol and increase diabetic numbers but the patient continues to decline at this time.  Patient is back to doing all his usual activities without restrictions.  He has been trying to eat healthier and keep active.  PCP is assisting in managing HLD, DM and HTN.  Denies new or worsening stroke/TIA symptoms.  ROS:   14 system review of systems performed and negative with exception of ringing in ears, easy bruising, easy bleeding, dizziness, decreased energy, and snoring  PMH:  Past Medical History:  Diagnosis Date  . Abnormal echocardiogram 04/03/2017  . CAD S/P percutaneous coronary angioplasty 04/02/17 DES to RCA 04/02/2017  . Calcium oxalate renal stones   . Chewing tobacco use   . Gallstones  hx/o, s/p choleycystectomy  . H/O echocardiogram 10/2008   no wall thickness, EF 55%, mild mitral regurge  . Hyperlipidemia   . Hypertension    hx/o, noncompliance prior with medication  . Influenza vaccine refused 09/2013  . Rheumatic fever    age 33  . Stroke ([place]) 10/02/2008  . Tinnitus     PSH:  Past Surgical History:  Procedure Laterality Date  .  blood clot  1997   brain  . BRAIN SURGERY  1997   craniotomy, evacuation of hematoma  . CHOLECYSTECTOMY  09/06/2011   Procedure: LAPAROSCOPIC CHOLECYSTECTOMY WITH INTRAOPERATIVE CHOLANGIOGRAM;  Surgeon: Earnstine Regal, MD;  [facility];  Service: General;  Laterality: N/A;  . COLONOSCOPY  01/04/14   normal. Dr. Owens Loffler  . CORONARY STENT INTERVENTION N/A 04/02/2017   Procedure: CORONARY STENT INTERVENTION;  Surgeon: Leonie Man, MD;  Location: [place] [facility];  Service: Cardiovascular;  Laterality: N/A;  . Dental implant    . INTRAVASCULAR PRESSURE WIRE/FFR STUDY N/A 04/02/2017   Procedure: INTRAVASCULAR PRESSURE WIRE/FFR STUDY;  Surgeon: Leonie Man, MD;  Location: [place] [facility];  Service: Cardiovascular;  Laterality: N/A;  . JOINT REPLACEMENT  2004   LEFT BICEP REPAIR, RTC repair  . LEFT HEART CATH AND CORONARY ANGIOGRAPHY N/A 04/02/2017   Procedure: LEFT HEART CATH AND CORONARY ANGIOGRAPHY;  Surgeon: Leonie Man, MD;  Location: [place] [facility];  Service: Cardiovascular;  Laterality: N/A;  . LOOP RECORDER INSERTION N/A 02/27/2017   Procedure: LOOP RECORDER INSERTION;  Surgeon: Sanda Klein, MD;  Location: [place] [facility];  Service: Cardiovascular;  Laterality: N/A;  . LOOP RECORDER INSERTION N/A 02/27/2017   Procedure: LOOP RECORDER INSERTION;  Surgeon: Pixie Casino, MD;  Location: [place];  Service: Cardiovascular;  Laterality: N/A;  . ROTATOR CUFF REPAIR     Right  . SIGMOIDOSCOPY    . TEE WITHOUT CARDIOVERSION N/A 02/27/2017   Procedure: TRANSESOPHAGEAL ECHOCARDIOGRAM (TEE);  Surgeon: Pixie Casino, MD;  Location: [facility];  Service: Cardiovascular;  Laterality: N/A;    Social History:  Social History   Socioeconomic History  . Marital status: Single    Spouse name: Not on file  . Number of children: 2  . Years of education: Not on file  . Highest education level: Not on file  Occupational History    Employer: [employer]  . Financial resource strain: Not on file  . Food insecurity:    Worry: Not on file    Inability: Not on file  . Transportation needs:    Medical: Not on file    Non-medical: Not on file  Tobacco Use  . Smoking status: Never Smoker  . Smokeless tobacco: Current User    Types: Chew  Substance and Sexual Activity  . Alcohol use: No    Alcohol/week: 0.0 oz  . Drug use: No  . Sexual activity: Not on file  Lifestyle  . Physical activity:    Days per week: Not on file    Minutes per session: Not on file  . Stress: Not on file  Relationships  . Social connections:    Talks on phone: Not on file    Gets together: Not on file    Attends religious service: Not on file    Active member of club or organization: Not on file    Attends meetings of clubs or organizations: Not on file    Relationship status: Not on  file  . Intimate partner violence:    Fear of current or ex partner: Not on file    Emotionally abused: Not on file    Physically abused: Not on file    Forced sexual activity: Not on file  Other Topics Concern  . 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      Family History:  Family History  Problem Relation Age of Onset  . Heart disease Mother 10       MI  . Migraines Mother   . Cirrhosis Brother   . Alcohol abuse Father        lived to age 50  . Diabetes Brother   . Colon cancer Neg Hx   . Cancer Neg Hx   . Stroke Neg Hx   . Hypertension Neg Hx   . Hyperlipidemia Neg Hx     Medications:   Current Outpatient Medications on File Prior to Visit  Medication Sig Dispense Refill  . amLODipine (NORVASC) 5 MG tablet Take 5 mg by mouth daily.    Marland Kitchen aspirin 81 MG chewable tablet Chew 1 tablet (81 mg total) by mouth daily.    Marland Kitchen atorvastatin (LIPITOR) 40 MG tablet Take 1 tablet (40 mg total) by mouth daily at 6 PM. 90 tablet 3  . carvedilol (COREG)  6.25 MG tablet Take 1 tablet (6.25 mg total) by mouth 2 (two) times daily with a meal. 180 tablet 3  . clopidogrel (PLAVIX) 75 MG tablet Take 1 tablet (75 mg total) by mouth daily. 90 tablet 3  . Multiple Vitamins-Minerals (PRESERVISION AREDS 2) CAPS Take 2 capsules by mouth daily.    . nitroGLYCERIN (NITROSTAT) 0.4 MG SL tablet Place 1 tablet (0.4 mg total) under the tongue every 5 (five) minutes as needed for chest pain. 25 tablet 4  . sacubitril-valsartan (ENTRESTO) 97-103 MG Take 1 tablet by mouth 2 (two) times daily. 60 tablet 2   No current facility-administered medications on file prior to visit.     Allergies:  No Known Allergies   Physical Exam  Vitals:   05/15/17 0727  BP: 128/78  Pulse: (!) 59  Weight: 168 lb (76.2 kg)  Height: 5\' 6"  (1.676 m)   Body mass index is 27.12 kg/m. No exam data present  General: well developed, elderly Caucasian male, well nourished, seated, in no evident distress Head: head normocephalic and atraumatic.   Neck: supple with no carotid or supraclavicular bruits Cardiovascular: regular rate and rhythm, no murmurs Musculoskeletal: no deformity Skin:  no rash/petichiae Vascular:  Normal pulses all extremities  Neurologic Exam Mental Status: Awake and fully alert. Oriented to place and time. Recent and remote memory intact. Attention span, concentration and fund of knowledge appropriate. Mood and affect appropriate.  Cranial Nerves: Fundoscopic exam reveals sharp disc margins. Pupils equal, briskly reactive to light. Extraocular movements full without nystagmus. Visual fields full to confrontation. Hearing intact. Facial sensation intact. Face, tongue, palate moves normally and symmetrically.  Motor: Normal bulk and tone. Normal strength in all tested extremity muscles. Sensory.: intact to touch , pinprick , position and vibratory sensation.  Coordination: Rapid alternating movements normal in all extremities. Finger-to-nose and heel-to-shin  performed accurately bilaterally. Gait and Station: Arises from chair without difficulty. Stance is normal. Gait demonstrates normal stride length and balance . Able to heel, toe and tandem walk without difficulty.  Reflexes: 1+ and symmetric. Toes  downgoing.    NIHSS  0 Modified Rankin  1   Diagnostic Data (Labs, Imaging, Testing)  Ct Head Wo Contrast Result Date: 02/25/2017 IMPRESSION: No acute intracranial abnormality. Electronically Signed   By: Rolm Baptise M.D.   On: 02/25/2017 17:20   Ct Angio Head/Neck W Or Wo Contrast Result Date: 02/26/2017 IMPRESSION: 1. Atherosclerosis most heavily involving the posterior circulation. 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 These stenoses lead to under opacification of left PCA vessels when compared to the right (which is fetal type). 2. Approximately 50% left supraclinoid ICA stenosis. 3. Atherosclerosis in the neck without flow limiting stenosis or ulceration.  [name] Result Date: 02/25/2017 IMPRESSION: 1. Patchy small volume acute ischemic nonhemorrhagic subcentimeter infarcts involving the left basal ganglia, left parieto-occipital region, and right cerebellar hemisphere as above. 2. Small remote left thalamic lacunar infarct. 3. Sequelae of prior left parietal craniotomy.   Echocardiogram:                                               Study Conclusions - Left ventricle: The cavity size was mildly dilated. Systolic function was moderately reduced. The estimated ejection fraction was in the range of 35% to 40%. There is akinesis of the entireinferolateral, inferior, and inferoseptal myocardium. Features are consistent with a pseudonormal left ventricular filling pattern, with concomitant abnormal relaxation and increased filling pressure (grade 2 diastolic dysfunction). Doppler parameters are consistent with high ventricular filling pressure. - Aortic  valve: Trileaflet; mildly thickened, mildly calcified leaflets. - Mitral valve: There was mild regurgitation. - Left atrium: The atrium was mildly dilated.  TEE  02/27/17 Impressions: - No cardiac source of emboli was indentified.  Cardiac Cath 04/02/17  The left ventricular systolic function is normal.  Prox RCA-1 lesion is 70% stenosed. Prox RCA-2 lesion is 50% stenosed. -- FFR 0.8  A drug-eluting stent was successfully placed using a STENT SIERRA 2.75 X 18 MM.  Post intervention, there is a 0% residual stenosis.  Dist RCA lesion is 20% stenosed.  Mid LM to Dist LM lesion is 30% stenosed. Prox Cx lesion is 40% stenosed.  LV end diastolic pressure is mildly elevated.    ASSESSMENT: Justin [name] is a 67 y.o. year old male here with left BG, left parital/occipital, and right cerebellar infarcts on 02/15/17 secondary to possibly embolic. Vascular risk factors include HTN, HLD, and DM.   PLAN: -Continue aspirin 81 mg daily and clopidogrel 75 mg daily  and lipitor  for secondary stroke prevention -F/u with PCP regarding your HLD, HTN, and DM management -continue to monitor BP at home -Consider EEG if these events continue or increase in frequency -advised to monitor her closely regarding length of these episodes, but he was doing prior to these episodes, and exact symptoms -Monitor loop recorder -Maintain strict control of hypertension with blood pressure goal below 130/90, diabetes with hemoglobin A1c goal below 6.5% and cholesterol with LDL cholesterol (bad cholesterol) goal below 70 mg/dL. I also advised the patient to eat a healthy diet with plenty of whole grains, cereals, fruits and vegetables, exercise regularly and maintain ideal body weight.  Follow up in 3 months or call earlier if needed   Greater than 50% time during this 25 minute consultation visit was spent on counseling and coordination of care about HLD,  HTN and DM, discussion about risk benefit of  anticoagulation and answering questions.   Venancio Poisson, AGNP-BC  [facility] [address] [place],  [address]  Phone [phone] Fax [phone]

## 2017-05-14 NOTE — Telephone Encounter (Signed)
New message:      *STAT* If patient is at the pharmacy, call can be transferred to refill team.   1. Which medications need to be refilled? (please list name of each medication and dose if known) sacubitril-valsartan (ENTRESTO) 97-103 MG  2. Which pharmacy/location (including street and city if local pharmacy) is medication to be sent to?[id number] - [place], [place] - [address]  3. Do they need a 30 day or 90 day supply? [place]

## 2017-05-15 ENCOUNTER — Telehealth: Payer: Self-pay | Admitting: Adult Health

## 2017-05-15 ENCOUNTER — Ambulatory Visit: Payer: Medicare HMO | Admitting: Adult Health

## 2017-05-15 ENCOUNTER — Encounter: Payer: Self-pay | Admitting: Adult Health

## 2017-05-15 VITALS — BP 128/78 | HR 59 | Ht 66.0 in | Wt 168.0 lb

## 2017-05-15 DIAGNOSIS — I10 Essential (primary) hypertension: Secondary | ICD-10-CM

## 2017-05-15 DIAGNOSIS — E785 Hyperlipidemia, unspecified: Secondary | ICD-10-CM

## 2017-05-15 DIAGNOSIS — I639 Cerebral infarction, unspecified: Secondary | ICD-10-CM | POA: Diagnosis not present

## 2017-05-15 DIAGNOSIS — Z87898 Personal history of other specified conditions: Secondary | ICD-10-CM

## 2017-05-15 NOTE — Telephone Encounter (Signed)
EEG order and schedule for patient per [facility].

## 2017-05-15 NOTE — Telephone Encounter (Signed)
Pt states that at his appointment this morning NP Janett Billow mentioned an EEG to him.  Pt states he has thought about it and would like to be scheduled for the EEG. Please have pt called to scheduled

## 2017-05-15 NOTE — Addendum Note (Signed)
Addended by: Venancio Poisson on: 05/15/2017 09:19 AM   Modules accepted: Orders

## 2017-05-15 NOTE — Telephone Encounter (Signed)
Patient had complaints during visit of blurry vision, disorientation, and then feeling extreme exhaustion where he may sleep for a few hours. He denies losing consciousness. These events happened 3x this week alone and has been occurring since his stroke. Patient does have a seizure history. Recommend EEG to rule out seizures. EEG order placed. Thank you

## 2017-05-15 NOTE — Patient Instructions (Signed)
Continue aspirin 81 mg daily and clopidogrel 75 mg daily  and lipitor  for secondary stroke prevention  Continue to follow up with PCP regarding cholesterol and blood pressure management  Continue to monitor loop recorder - we will call you if atrial fibrillation is found  Continue to monitor blood pressure at home  Continue to stay active and eat a healthy diet  Maintain strict control of hypertension with blood pressure goal below 130/90, diabetes with hemoglobin A1c goal below 6.5% and cholesterol with LDL cholesterol (bad cholesterol) goal below 70 mg/dL. I also advised the patient to eat a healthy diet with plenty of whole grains, cereals, fruits and vegetables, exercise regularly and maintain ideal body weight.  Followup in the future with me in 3 months or call earlier if needed

## 2017-05-19 ENCOUNTER — Ambulatory Visit: Payer: Medicare HMO | Admitting: Neurology

## 2017-05-19 ENCOUNTER — Telehealth: Payer: Self-pay | Admitting: Cardiovascular Disease

## 2017-05-19 DIAGNOSIS — I639 Cerebral infarction, unspecified: Secondary | ICD-10-CM

## 2017-05-19 DIAGNOSIS — R410 Disorientation, unspecified: Secondary | ICD-10-CM | POA: Diagnosis not present

## 2017-05-19 DIAGNOSIS — Z87898 Personal history of other specified conditions: Secondary | ICD-10-CM

## 2017-05-19 NOTE — Procedures (Signed)
    History:  Justin [name] is a 67 year old gentleman problems with multiple cerebral infarcts.  The patient is having episodes where intermittently he is unable to focus and feels disoriented, the patient may have to lay down for a few hours following the events.  The patient does not lose consciousness.  He is being evaluated for these events.  This is a routine EEG.  No skull defects are noted.  Medications include Norvasc, aspirin, Lipitor, Coreg, Plavix, multivitamins, nitroglycerin, and Entresto.  EEG classification: Normal awake and drowsy  Description of the recording: The background rhythms of this recording consists of a fairly well modulated medium amplitude alpha rhythm of 9 Hz that is reactive to eye opening and closure. As the record progresses, the patient appears to remain in the waking state throughout the recording. Photic stimulation was performed, resulting in a bilateral and symmetric photic driving response. Hyperventilation was not performed. Toward the end of the recording, the patient enters the drowsy state with slight symmetric slowing seen. The patient never enters stage II sleep. At no time during the recording does there appear to be evidence of spike or spike wave discharges or evidence of focal slowing. EKG monitor shows no evidence of cardiac rhythm abnormalities with a heart rate of 60.  Impression: This is a normal EEG recording in the waking and drowsy state. No evidence of ictal or interictal discharges are seen.

## 2017-05-19 NOTE — Telephone Encounter (Signed)
Spoke with Pt's wife and informed that that we don't carry samples for Entresto 97-103 mg. Per Pharm D, instructed to give pt Sample of Entresto 49-51 mg and have pt take 2 tablets in the morning and 2 tablets at night. Pt and wife verbalized understanding and made aware they will available for pick up at the front desk. Entresto 40-08 Qty: 1 bottle Lot# [id number] Exp: 1/21

## 2017-05-19 NOTE — Telephone Encounter (Signed)
Patient calling the office for samples of medication:   1.  What medication and dosage are you requesting samples for? Entesto  2.  Are you currently out of this medication? Pt needs enough until he gets his medicine-they are waiting on insurance approval

## 2017-05-20 ENCOUNTER — Telehealth: Payer: Self-pay | Admitting: Neurology

## 2017-05-20 NOTE — Telephone Encounter (Signed)
-----   Message from Justin Poisson, NP sent at 05/20/2017  8:04 AM EDT ----- Can you please inform patient that his EEG was negative for seizure activity. Thank you.

## 2017-05-20 NOTE — Telephone Encounter (Signed)
Called the patient and made him aware that the EEG was normal and didn't show any seizure activity. The pt was grateful for that but was still concerned for everything taking placed. I have scheduled a follow up apt to discuss in more detail for Thursday morning.

## 2017-05-22 ENCOUNTER — Ambulatory Visit: Payer: Medicare HMO | Admitting: Adult Health

## 2017-05-22 ENCOUNTER — Encounter: Payer: Self-pay | Admitting: Adult Health

## 2017-05-22 ENCOUNTER — Telehealth: Payer: Self-pay | Admitting: Cardiovascular Disease

## 2017-05-22 VITALS — BP 119/73 | HR 73 | Ht 66.0 in | Wt 168.0 lb

## 2017-05-22 DIAGNOSIS — I639 Cerebral infarction, unspecified: Secondary | ICD-10-CM

## 2017-05-22 NOTE — Patient Instructions (Addendum)
Continue aspirin 81 mg daily and clopidogrel 75 mg daily  and lipitor  for secondary stroke prevention  Monitor episodes - if they worsen or increase in frequency, please notify us or call your cardiologist - please do not hesitate to call 911  Maintain strict control of hypertension with blood pressure goal below 130/90, diabetes with hemoglobin A1c goal below 6.5% and cholesterol with LDL cholesterol (bad cholesterol) goal below 70 mg/dL. I also advised the patient to eat a healthy diet with plenty of whole grains, cereals, fruits and vegetables, exercise regularly and maintain ideal body weight.  Followup in the future on 08/14/17

## 2017-05-22 NOTE — Telephone Encounter (Signed)
Returned call to patient, advised PA will be completed today and will call to let him know when complete.     Patient aware and verbalized understanding.  Primary aware.

## 2017-05-22 NOTE — Progress Notes (Signed)
[facility] [address] [place]. [place] [address] [phone]       OFFICE FOLLOW UP NOTE  Mr. Justin [name] Date of Birth:  [birth year] Medical Record Number:  [id number]   Reason for Referral:  hospital stroke follow up  CHIEF COMPLAINT:  Chief Complaint  Patient presents with  . Follow-up    discuss EEG results    HPI: Justin [name] is being seen today for initial visit in the office for multi infarct stroke on 02/25/17. History obtained from patient and chart review. Reviewed all radiology images and labs personally.  Justin [name] is a 66 year old male with history of HTN, stroke in 2010, traumatic ICH status post craniotomy in 1997, HLD admitted for double vision, difficulty walking, right lower quadrantanopia and intermittent weakness.  CT no acute abnormality.  However, MRI showed left BG, left MCA/PCA and right cerebellum patchy punctate infarcts.  Examination showed right lower quadrantanopia.  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  EF 35-40%.  LDL 139 and A1c 6.5.  Patient denies any cardiac issues in the past.  Denies palpitation. TEE unremarkable, no PFO. Loop recorder placed. Cardiology consulted for cardiomyopathy and possible cardiac cath. Patient stated that he has been on aspirin 81 several years ago, he re-started aspirin 81 for the last several days and recommended increasing asa to 325 and plavix 75.  D/c to CIR where he made good recovery. On 04/02/17, patient underwent cardiac cath with stent placement and recommendation of DAPT with aspirin and plavix for 12 months.   History 05/15/17: Since discharge, patient has been doing well and is accompanied at today's appointment with his wife.  He states that he no longer has consistent double vision or vision problems.  Intermittently he may have times where he cannot focus and feels disoriented and has to lay down for couple hours  after.  Denies losing consciousness or any other associated symptoms.  He does state these happen more after working majority of the day are already feeling fatigued. Denies headaches or hx of migraines. Cardiologist recently increased Entresto.  Patient does have history of seizures will like to hold off on EEG monitoring to see if they subside.  Continue to take aspirin and Plavix with minor bruising but no bleeding.  Continue to take Lipitor without side effects or myalgias.  There is blood pressure satisfactory 120/70.  Loop recorder has not shown atrial fibrillation thus far.  He does admit to snoring, daytime sleepiness, and frequent napping but declines sleep study due to not wanting to wear CPAP or use oral device.  Advised patient of increased risk of cardiac disease and stroke including increased blood pressure, increased cholesterol and increase diabetic numbers if he does have untreated OSA but the patient continues to decline at this time.  Patient is back to doing all his usual activities without restrictions.  He has been trying to eat healthier and keep active.  PCP is assisting in managing HLD, DM and HTN.  Denies new or worsening stroke/TIA symptoms.  Update 05/22/17: Patient returns today for patient requested follow-up visit.  Recently underwent EEG which was a normal EEG without seizure activity.  Patient did have an episode 3 days ago when his mouth went numb, had double vision and hard time focusing.  This lasted approximately 1 hour, he took a nap and woke up feeling better with all symptoms resolved.  Prior to this occurring, he just  woke up in the morning, went to the kitchen to make coffee and sat down on the chair and started having the symptoms.  He states this is the worst one that he has had yet.  Did not check blood pressure during this episode but believes his blood pressure is not a factor.  Denies headache, weakness, extremity numbness or speech difficulties.  Wife believes that  these episodes have been less in frequency.  Patient does continue to take Plavix and aspirin without side effects of bleeding or bruising.  Continues to take Lipitor without side effects of myalgias.  Blood pressure today satisfactory at 119/73.  Patient continues to stay active and he was able to go out the past 2 days to go fishing without these episodes occurring.  Loop recorder has not shown atrial fibrillation thus far.  ROS:   14 system review of systems performed and negative with exception of see HPI  PMH:  Past Medical History:  Diagnosis Date  . Abnormal echocardiogram 04/03/2017  . CAD S/P percutaneous coronary angioplasty 04/02/17 DES to RCA 04/02/2017  . Calcium oxalate renal stones   . Chewing tobacco use   . Gallstones    hx/o, s/p choleycystectomy  . H/O echocardiogram 10/2008   no wall thickness, EF 55%, mild mitral regurge  . Hyperlipidemia   . Hypertension    hx/o, noncompliance prior with medication  . Influenza vaccine refused 09/2013  . Rheumatic fever    age 49  . Stroke ([place]) 10/02/2008  . Tinnitus     PSH:  Past Surgical History:  Procedure Laterality Date  . blood clot  1997   brain  . BRAIN SURGERY  1997   craniotomy, evacuation of hematoma  . CHOLECYSTECTOMY  09/06/2011   Procedure: LAPAROSCOPIC CHOLECYSTECTOMY WITH INTRAOPERATIVE CHOLANGIOGRAM;  Surgeon: Earnstine Regal, MD;  Location: [facility];  Service: General;  Laterality: N/A;  . COLONOSCOPY  01/04/14   normal. Dr. Owens Loffler  . CORONARY STENT INTERVENTION N/A 04/02/2017   Procedure: CORONARY STENT INTERVENTION;  Surgeon: Leonie Man, MD;  Location: [place];  Service: Cardiovascular;  Laterality: N/A;  . Dental implant    . INTRAVASCULAR PRESSURE WIRE/FFR STUDY N/A 04/02/2017   Procedure: INTRAVASCULAR PRESSURE WIRE/FFR STUDY;  Surgeon: Leonie Man, MD;  Location: [facility];  Service: Cardiovascular;  Laterality: N/A;  . JOINT REPLACEMENT  2004   LEFT BICEP REPAIR, RTC repair    . LEFT HEART CATH AND CORONARY ANGIOGRAPHY N/A 04/02/2017   Procedure: LEFT HEART CATH AND CORONARY ANGIOGRAPHY;  Surgeon: Leonie Man, MD;  Location: [facility];  Service: Cardiovascular;  Laterality: N/A;  . LOOP RECORDER INSERTION N/A 02/27/2017   Procedure: LOOP RECORDER INSERTION;  Surgeon: Sanda Klein, MD;  Location: [facility];  Service: Cardiovascular;  Laterality: N/A;  . LOOP RECORDER INSERTION N/A 02/27/2017   Procedure: LOOP RECORDER INSERTION;  Surgeon: Pixie Casino, MD;  Location: [facility];  Service: Cardiovascular;  Laterality: N/A;  . ROTATOR CUFF REPAIR     Right  . SIGMOIDOSCOPY    . TEE WITHOUT CARDIOVERSION N/A 02/27/2017   Procedure: TRANSESOPHAGEAL ECHOCARDIOGRAM (TEE);  Surgeon: Pixie Casino, MD;  Location: [facility];  Service: Cardiovascular;  Laterality: N/A;    Social History:  Social History   Socioeconomic History  . Marital status: Single    Spouse name: Not on file  . Number of children: 2  . Years of education: Not on file  .  Highest education level: Not on file  Occupational History    Employer: [employer]  . Financial resource strain: Not on file  . Food insecurity:    Worry: Not on file    Inability: Not on file  . Transportation needs:    Medical: Not on file    Non-medical: Not on file  Tobacco Use  . Smoking status: Never Smoker  . Smokeless tobacco: Current User    Types: Chew  Substance and Sexual Activity  . Alcohol use: No    Alcohol/week: 0.0 oz  . Drug use: No  . Sexual activity: Not on file  Lifestyle  . Physical activity:    Days per week: Not on file    Minutes per session: Not on file  . Stress: Not on file  Relationships  . Social connections:    Talks on phone: Not on file    Gets together: Not on file    Attends religious service: Not on file    Active member of club or organization: Not on file    Attends meetings of clubs or organizations: Not on file     Relationship status: Not on file  . Intimate partner violence:    Fear of current or ex partner: Not on file    Emotionally abused: Not on file    Physically abused: Not on file    Forced sexual activity: Not on file  Other Topics Concern  . Not on file  Social History Narrative   Single, has 2 sons, 27yo and 68yo, electrician, walks regularly, 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      Family History:  Family History  Problem Relation Age of Onset  . Heart disease Mother 53       MI  . Migraines Mother   . Cirrhosis Brother   . Alcohol abuse Father        lived to age 67  . Diabetes Brother   . Colon cancer Neg Hx   . Cancer Neg Hx   . Stroke Neg Hx   . Hypertension Neg Hx   . Hyperlipidemia Neg Hx     Medications:   Current Outpatient Medications on File Prior to Visit  Medication Sig Dispense Refill  . amLODipine (NORVASC) 5 MG tablet Take 5 mg by mouth daily.    Marland Kitchen aspirin 81 MG chewable tablet Chew 1 tablet (81 mg total) by mouth daily.    Marland Kitchen atorvastatin (LIPITOR) 40 MG tablet Take 1 tablet (40 mg total) by mouth daily at 6 PM. 90 tablet 3  . carvedilol (COREG) 6.25 MG tablet Take 1 tablet (6.25 mg total) by mouth 2 (two) times daily with a meal. 180 tablet 3  . clopidogrel (PLAVIX) 75 MG tablet Take 1 tablet (75 mg total) by mouth daily. 90 tablet 3  . Multiple Vitamins-Minerals (PRESERVISION AREDS 2) CAPS Take 2 capsules by mouth daily.    . nitroGLYCERIN (NITROSTAT) 0.4 MG SL tablet Place 1 tablet (0.4 mg total) under the tongue every 5 (five) minutes as needed for chest pain. 25 tablet 4  . sacubitril-valsartan (ENTRESTO) 97-103 MG Take 1 tablet by mouth 2 (two) times daily. 60 tablet 2   No current facility-administered medications on file prior to visit.     Allergies:  No Known Allergies   Physical Exam  Vitals:   05/22/17 0746  BP: 119/73  Pulse: 73  Weight: 168 lb (76.2 kg)  Height: 5\' 6"  (1.676 m)  Body mass index is 27.12 kg/m. No exam data  present  General: well developed, elderly Caucasian male, well nourished, seated, in no evident distress Head: head normocephalic and atraumatic.   Neck: supple with no carotid or supraclavicular bruits Cardiovascular: regular rate and rhythm, no murmurs Musculoskeletal: no deformity Skin:  no rash/petichiae Vascular:  Normal pulses all extremities  Neurologic Exam Mental Status: Awake and fully alert. Oriented to place and time. Recent and remote memory intact. Attention span, concentration and fund of knowledge appropriate. Mood and affect appropriate.  Cranial Nerves: Fundoscopic exam reveals sharp disc margins. Pupils equal, briskly reactive to light. Extraocular movements full without nystagmus. Visual fields full to confrontation. Hearing intact. Facial sensation intact. Face, tongue, palate moves normally and symmetrically.  Motor: Normal bulk and tone. Normal strength in all tested extremity muscles. Sensory.: intact to touch , pinprick , position and vibratory sensation.  Coordination: Rapid alternating movements normal in all extremities. Finger-to-nose and heel-to-shin performed accurately bilaterally. Gait and Station: Arises from chair without difficulty. Stance is normal. Gait demonstrates normal stride length and balance . Able to heel, toe and tandem walk without difficulty.  Reflexes: 1+ and symmetric. Toes downgoing.    NIHSS  0 Modified Rankin  1   Diagnostic Data (Labs, Imaging, Testing)  Ct Head Wo Contrast Result Date: 02/25/2017 IMPRESSION: No acute intracranial abnormality. Electronically Signed   By: Rolm Baptise M.D.   On: 02/25/2017 17:20   Ct Angio Head/Neck W Or Wo Contrast Result Date: 02/26/2017 IMPRESSION: 1. Atherosclerosis most heavily involving the posterior circulation. 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 These stenoses lead to under opacification of left PCA vessels when compared to the right (which is fetal  type). 2. Approximately 50% left supraclinoid ICA stenosis. 3. Atherosclerosis in the neck without flow limiting stenosis or ulceration.  [name] Result Date: 02/25/2017 IMPRESSION: 1. Patchy small volume acute ischemic nonhemorrhagic subcentimeter infarcts involving the left basal ganglia, left parieto-occipital region, and right cerebellar hemisphere as above. 2. Small remote left thalamic lacunar infarct. 3. Sequelae of prior left parietal craniotomy.   Echocardiogram:                                               Study Conclusions - Left ventricle: The cavity size was mildly dilated. Systolic function was moderately reduced. The estimated ejection fraction was in the range of 35% to 40%. There is akinesis of the entireinferolateral, inferior, and inferoseptal myocardium. Features are consistent with a pseudonormal left ventricular filling pattern, with concomitant abnormal relaxation and increased filling pressure (grade 2 diastolic dysfunction). Doppler parameters are consistent with high ventricular filling pressure. - Aortic valve: Trileaflet; mildly thickened, mildly calcified leaflets. - Mitral valve: There was mild regurgitation. - Left atrium: The atrium was mildly dilated.  TEE  02/27/17 Impressions: - No cardiac source of emboli was indentified.  Cardiac Cath 04/02/17  The left ventricular systolic function is normal.  Prox RCA-1 lesion is 70% stenosed. Prox RCA-2 lesion is 50% stenosed. -- FFR 0.8  A drug-eluting stent was successfully placed using a STENT SIERRA 2.75 X 18 MM.  Post intervention, there is a 0% residual stenosis.  Dist RCA lesion is 20% stenosed.  Mid LM to Dist LM lesion is 30% stenosed. Prox Cx lesion is 40% stenosed.  LV end diastolic  pressure is mildly elevated.  EEG 05/19/2017 Impression: This is a normal EEG recording in the waking and drowsy state. No evidence of ictal or interictal  discharges are seen.    ASSESSMENT: Justin [name] is a 67 y.o. year old male here with left BG, left parital/occipital, and right cerebellar infarcts on 02/15/17 secondary to possibly embolic. Vascular risk factors include HTN, HLD, and DM. Patient returns today for follow up visit after EEG.    PLAN: -Continue aspirin 81 mg daily and clopidogrel 75 mg daily  and lipitor  for secondary stroke prevention -Advised patient that these episodes could possibly be a complicated migraine but patient did not want to start any additional medication at this time versus possibly related to poor cardiac history and consider scheduling appointment sooner with cardiologist to be evaluated -Patient would like to continue to monitor these episodes for right now without additional tests or medication management.  Patient will notify us if these get worse in severity or frequency  -F/u with PCP regarding your HLD, HTN, and DM management -continue to monitor BP at home -Monitor loop recorder -Maintain strict control of hypertension with blood pressure goal below 130/90, diabetes with hemoglobin A1c goal below 6.5% and cholesterol with LDL cholesterol (bad cholesterol) goal below 70 mg/dL. I also advised the patient to eat a healthy diet with plenty of whole grains, cereals, fruits and vegetables, exercise regularly and maintain ideal body weight.  Patient will keep previously scheduled appointment on 08/14/2017 for follow-up or call earlier if needed   Greater than 50% time during this 25 minute consultation visit was spent on counseling and coordination of care about HLD, HTN and DM, discussion about risk benefit of anticoagulation and answering questions.   Justin [name], AGNP-BC  [facility] [address] [place], [place] [address]  Phone [phone] Fax [phone]

## 2017-05-22 NOTE — Telephone Encounter (Signed)
New Message:      Pt c/o medication issue:  1. Name of Medication: sacubitril-valsartan (ENTRESTO) 97-103 MG  2. How are you currently taking this medication (dosage and times per day)?  Take 1 tablet by mouth 2 (two) times daily. 3. Are you having a reaction (difficulty breathing--STAT)? No  4. What is your medication issue? Pt states that we are needing to call the insurance company to have this medication approved for a refill. Pt states they will only fill the lower mg but will not fill the higher dosage without approval from [place]. Pt also states he will be out of this medication in two days.

## 2017-05-22 NOTE — Telephone Encounter (Addendum)
Prior authorization for Entresto 97-103 mg faxed to patient's insurance company via [email or web address]. Awaiting approval.  Key: [id number]

## 2017-05-22 NOTE — Progress Notes (Signed)
I have read the note, and I agree with the clinical assessment and plan.  Justin [name]   

## 2017-05-22 NOTE — Telephone Encounter (Signed)
Prior authorization for Justin [name] has been approved through 05/22/2018. PA Case #: [id number] Pharmacy notified via fax. Left message for patient. Advised to call back if he has questions.

## 2017-06-02 NOTE — Progress Notes (Signed)
HPI:  Justin [name] is a 67 y.o. male patient of Dr. [name] , with a PMH below who presents today for Entresto titration.  He was admitted to [facility] on Jan 29 for a CVA. During hospitalization a loop recorder was placed and an echocardiogram revealed an EF of 35%.  He followed up on the HF diagnosis with Dr. [name] on March 5 and it was recommended that he undergo coronary angiography.  On March 6 a heart cath was done, he had a DES placed in the RCA.  He followed up with Kerin Ransom on March 14, who noted patient was "not a fan" of medications. On March/17 his Delene Loll was titrated to 97/103mg  twice daily. Since then his prior-authorization from insurance was approved ,and follow up with neurologist showed stable condition. Patient reports one episode of dizzy spells last week with lasting symptoms for few hours until lay down but no other problems noted.   Blood Pressure Goal:  130/80  Current Medications:  Carvedilol 6.25 mg twice daily  Entresto 97-103 mg twice daily  Amlodipine 5mg  daily   Family History:  Mother died from MI at age 71  Father died from 77 "old age"  71 brother - 1 deceased with etoh  2 sons with hypertension  Social History:  Chewing tobacco 1 can q3d; no alcohol; coffee daily, unsweet tea;   Diet:  Mostly home cooked; very little added salt with cooking, none at table;   Exercise:  Walks daily 1/2 mile once to twice daily  Home BP readings:  Home BP cuff, bought in 2010 - [employer] brand, wrist cuff.  It read 10 points higher (both systolic and diastolic) consistently with 2 different tests.  Home readings (19) averaged 143/88 with a range of 128-158/76-100.    Intolerances:   nkda  Labs:  04/10/17:  Na 140, K 4.9, Glu 144, BUN 16, SCr 0.88.  CrCl = 85  Wt Readings from Last 3 Encounters:  05/22/17 168 lb (76.2 kg)  05/15/17 168 lb (76.2 kg)  05/14/17 170 lb (77.1 kg)   BP Readings from Last 3 Encounters:  06/03/17 132/64  05/22/17 119/73   05/15/17 128/78   Pulse Readings from Last 3 Encounters:  06/03/17 62  05/22/17 73  05/15/17 (!) 59    Current Outpatient Medications  Medication Sig Dispense Refill  . amLODipine (NORVASC) 5 MG tablet Take 5 mg by mouth daily.    Marland Kitchen aspirin 81 MG chewable tablet Chew 1 tablet (81 mg total) by mouth daily.    Marland Kitchen atorvastatin (LIPITOR) 40 MG tablet Take 1 tablet (40 mg total) by mouth daily at 6 PM. 90 tablet 3  . carvedilol (COREG) 6.25 MG tablet Take 1 tablet (6.25 mg total) by mouth 2 (two) times daily with a meal. 180 tablet 3  . clopidogrel (PLAVIX) 75 MG tablet Take 1 tablet (75 mg total) by mouth daily. 90 tablet 3  . Multiple Vitamins-Minerals (PRESERVISION AREDS 2) CAPS Take 2 capsules by mouth daily.    . nitroGLYCERIN (NITROSTAT) 0.4 MG SL tablet Place 1 tablet (0.4 mg total) under the tongue every 5 (five) minutes as needed for chest pain. 25 tablet 4  . sacubitril-valsartan (ENTRESTO) 97-103 MG Take 1 tablet by mouth 2 (two) times daily. 60 tablet 2   No current facility-administered medications for this visit.     No Known Allergies  Past Medical History:  Diagnosis Date  . Abnormal echocardiogram 04/03/2017  . CAD S/P percutaneous coronary  angioplasty 04/02/17 DES to RCA 04/02/2017  . Calcium oxalate renal stones   . Chewing tobacco use   . Gallstones    hx/o, s/p choleycystectomy  . H/O echocardiogram 10/2008   no wall thickness, EF 55%, mild mitral regurge  . Hyperlipidemia   . Hypertension    hx/o, noncompliance prior with medication  . Influenza vaccine refused 09/2013  . Rheumatic fever    age 48  . Stroke ([place]) 10/02/2008  . Tinnitus     Blood pressure 132/64, pulse 62, SpO2 96 %.    Chronic combined systolic and diastolic heart failure (HCC) Blood pressure remains stable at 132/64. All other readings during recent office visits also at goal. Patient resume his amlodipine 5mg  for unknow reason but is working well at controlling his blood pressure.  Will repeat  BMEt to re-assess renal function after Entresto dose increased, continue current therapy without changes,and follow up as needed.    Caprina Wussow Rodriguez-Guzman PharmD, BCPS, CPP [place] [address] [place] [address] 06/03/2017 9:05 AM

## 2017-06-03 ENCOUNTER — Encounter: Payer: Self-pay | Admitting: Pharmacist

## 2017-06-03 ENCOUNTER — Ambulatory Visit (INDEPENDENT_AMBULATORY_CARE_PROVIDER_SITE_OTHER): Payer: Medicare HMO | Admitting: *Deleted

## 2017-06-03 ENCOUNTER — Ambulatory Visit: Payer: Medicare HMO | Admitting: Pharmacist

## 2017-06-03 VITALS — BP 132/64 | HR 62

## 2017-06-03 DIAGNOSIS — I5042 Chronic combined systolic (congestive) and diastolic (congestive) heart failure: Secondary | ICD-10-CM

## 2017-06-03 DIAGNOSIS — I639 Cerebral infarction, unspecified: Secondary | ICD-10-CM

## 2017-06-03 LAB — BASIC METABOLIC PANEL
BUN/Creatinine Ratio: 16 (ref 10–24)
BUN: 14 mg/dL (ref 8–27)
CO2: 24 mmol/L (ref 20–29)
CREATININE: 0.85 mg/dL (ref 0.76–1.27)
Calcium: 9 mg/dL (ref 8.6–10.2)
Chloride: 102 mmol/L (ref 96–106)
GFR calc Af Amer: 103 mL/min/{1.73_m2} (ref >59)
GFR, EST NON AFRICAN AMERICAN: 90 mL/min/{1.73_m2} (ref >59)
GLUCOSE: 127 mg/dL — AB (ref 65–99)
Potassium: 4.6 mmol/L (ref 3.5–5.2)
Sodium: 139 mmol/L (ref 134–144)

## 2017-06-03 NOTE — Patient Instructions (Signed)
Return for a  follow up appointment as needed  Check your blood pressure at home daily (if able) and keep record of the readings.  Take your BP meds as follows: *NO MEDICATION CHANGES*  Bring your BP cuff and your record of home blood pressures to your next appointment.  Exercise as you're able, try to walk approximately 30 minutes per day.  Keep salt intake to a minimum, especially watch canned and prepared boxed foods.  Eat more fresh fruits and vegetables and fewer canned items.  Avoid eating in fast food restaurants.    HOW TO TAKE YOUR BLOOD PRESSURE: . Rest 5 minutes before taking your blood pressure. .  Don't smoke or drink caffeinated beverages for at least 30 minutes before. . Take your blood pressure before (not after) you eat. . Sit comfortably with your back supported and both feet on the floor (don't cross your legs). . Elevate your arm to heart level on a table or a desk. . Use the proper sized cuff. It should fit smoothly and snugly around your bare upper arm. There should be enough room to slip a fingertip under the cuff. The bottom edge of the cuff should be 1 inch above the crease of the elbow. . Ideally, take 3 measurements at one sitting and record the average.

## 2017-06-03 NOTE — Assessment & Plan Note (Signed)
Blood pressure remains stable at 132/64. All other readings during recent office visits also at goal. Patient resume his amlodipine 5mg  for unknow reason but is working well at controlling his blood pressure.  Will repeat BMEt to re-assess renal function after Entresto dose increased, continue current therapy without changes,and follow up as needed.

## 2017-06-04 LAB — CUP PACEART REMOTE DEVICE CHECK
Implantable Pulse Generator Implant Date: 20190131
MDC IDC SESS DTM: 20190404221015

## 2017-06-04 NOTE — Progress Notes (Signed)
Carelink Summary Report / Loop Recorder 

## 2017-06-06 NOTE — Progress Notes (Signed)
Personally  participated in, made any corrections needed, and agree with history, physical, neuro exam,assessment and plan as stated above.    Jacklynn Dehaas, MD [facility] 

## 2017-06-24 LAB — CUP PACEART REMOTE DEVICE CHECK
Date Time Interrogation Session: 20190507220720
MDC IDC PG IMPLANT DT: 20190131

## 2017-06-25 ENCOUNTER — Encounter: Payer: Self-pay | Admitting: Adult Health

## 2017-06-25 ENCOUNTER — Ambulatory Visit: Payer: Medicare HMO | Admitting: Adult Health

## 2017-06-25 VITALS — BP 134/72 | HR 72 | Ht 66.0 in | Wt 164.2 lb

## 2017-06-25 DIAGNOSIS — I10 Essential (primary) hypertension: Secondary | ICD-10-CM

## 2017-06-25 DIAGNOSIS — R0989 Other specified symptoms and signs involving the circulatory and respiratory systems: Secondary | ICD-10-CM

## 2017-06-25 DIAGNOSIS — I519 Heart disease, unspecified: Secondary | ICD-10-CM | POA: Diagnosis not present

## 2017-06-25 DIAGNOSIS — Z79899 Other long term (current) drug therapy: Secondary | ICD-10-CM

## 2017-06-25 MED ORDER — SACUBITRIL-VALSARTAN 97-103 MG PO TABS: 0.5000 | ORAL_TABLET | Freq: Two times a day (BID) | ORAL | 2 refills | Status: DC

## 2017-06-25 NOTE — Patient Instructions (Signed)
Medication Instructions:  DECREASE ENTRESTO 49/51MG  TWICE DAILY  If you need a refill on your cardiac medications before your next appointment, please call your pharmacy.  Labwork: BMET TODAY HERE IN OUR OFFICE AT [facility]  Testing/Procedures: Echocardiogram - Your physician has requested that you have an echocardiogram. Echocardiography is a painless test that uses sound waves to create images of your heart. It provides your doctor with information about the size and shape of your heart and how well your heart's chambers and valves are working. This procedure takes approximately one hour. There are no restrictions for this procedure. This will be performed at our [facility] location - [address], [address].  Special Instructions: CALL IN ONE WEEK AND TELL us HOW YOU ARE DOING ON ENTRESTO DECREASED DOSING  Follow-Up: Your physician wants you to follow-up in: AFTER TESTING WITH DR [name]   Thank you for choosing [facility] at [facility]!!

## 2017-06-25 NOTE — Progress Notes (Signed)
Cardiology Office Note   Date:  06/25/2017   ID:  Justin [name], DOB 29-Jun-1950, MRN [id number]  PCP:  Heinz Knuckles, MD  Cardiologist:  Dr.Croitoru  Chief Complaint  Patient presents with  . Follow-up    medications making him feel bad, pt states he has to make himself do things,no energy     History of Present Illness: Justin [name] is a 67 y.o. male who presents for ongoing assessment and management of hypertension with history of CVA 01/2017, status post loop recorder.  Echocardiogram showed LV EF of 35% to 40%, with inferior wall motion abnormalities.  On 04/02/2017 the patient had a cardiac catheterization revealing 70% right coronary artery, FFR was 0.8 and he underwent PCI with drug-eluting stent to the RCA.    The patient was last seen in the office on 04/10/2017 by Kerin Ransom, PA, the patient states that he was having good days and bad days but denied any cardiac symptoms.  The patient had several questions answered especially concerning medication compliance.  The patient was to have increased dose of Entresto if blood pressure was able to support this.  He also had a follow-up BMET.  The patient was seen by Mrs. Rodriguez-Guzman, pharmacist on 06/03/2017 for blood pressure evaluation which was stable.  The patient was continued amlodipine on his own, without being advised to do so.  At that time blood pressure was being well controlled and therefore no medication changes were made.  He comes today angry. He states that the medications have taken away his energy, he does not like the way they makes him feel, he has no desire to do anything. I asked him if he was having some depression and he began to cry. He reports that his libido is completely eliminated and he does not feel like himself anymore. He is often dizzy.    Past Medical History:  Diagnosis Date  . Abnormal echocardiogram 04/03/2017  . CAD S/P percutaneous coronary angioplasty 04/02/17 DES to RCA 04/02/2017  . Calcium  oxalate renal stones   . Chewing tobacco use   . Gallstones    hx/o, s/p choleycystectomy  . H/O echocardiogram 10/2008   no wall thickness, EF 55%, mild mitral regurge  . Hyperlipidemia   . Hypertension    hx/o, noncompliance prior with medication  . Influenza vaccine refused 09/2013  . Rheumatic fever    age 61  . Stroke ([place]) 10/02/2008  . Tinnitus     Past Surgical History:  Procedure Laterality Date  . blood clot  1997   brain  . BRAIN SURGERY  1997   craniotomy, evacuation of hematoma  . CHOLECYSTECTOMY  09/06/2011   Procedure: LAPAROSCOPIC CHOLECYSTECTOMY WITH INTRAOPERATIVE CHOLANGIOGRAM;  Surgeon: Earnstine Regal, MD;  Location: [facility];  Service: General;  Laterality: N/A;  . COLONOSCOPY  01/04/14   normal. Dr. Owens Loffler  . CORONARY STENT INTERVENTION N/A 04/02/2017   Procedure: CORONARY STENT INTERVENTION;  Surgeon: Leonie Man, MD;  Location: [place] [facility];  Service: Cardiovascular;  Laterality: N/A;  . Dental implant    . INTRAVASCULAR PRESSURE WIRE/FFR STUDY N/A 04/02/2017   Procedure: INTRAVASCULAR PRESSURE WIRE/FFR STUDY;  Surgeon: Leonie Man, MD;  Location: [place] [facility];  Service: Cardiovascular;  Laterality: N/A;  . JOINT REPLACEMENT  2004   LEFT BICEP REPAIR, RTC repair  . LEFT HEART CATH AND CORONARY ANGIOGRAPHY N/A 04/02/2017   Procedure: LEFT HEART CATH AND CORONARY ANGIOGRAPHY;  Surgeon: Leonie Man, MD;  Location: [place] [facility];  Service: Cardiovascular;  Laterality: N/A;  . LOOP RECORDER INSERTION N/A 02/27/2017   Procedure: LOOP RECORDER INSERTION;  Surgeon: Sanda Klein, MD;  Location: [place] [facility];  Service: Cardiovascular;  Laterality: N/A;  . LOOP RECORDER INSERTION N/A 02/27/2017   Procedure: LOOP RECORDER INSERTION;  Surgeon: Pixie Casino, MD;  Location: [place];  Service: Cardiovascular;  Laterality: N/A;  . ROTATOR CUFF REPAIR     Right  . SIGMOIDOSCOPY    . TEE WITHOUT CARDIOVERSION N/A 02/27/2017    Procedure: TRANSESOPHAGEAL ECHOCARDIOGRAM (TEE);  Surgeon: Pixie Casino, MD;  Location: [facility]COPY;  Service: Cardiovascular;  Laterality: N/A;     Current Outpatient Medications  Medication Sig Dispense Refill  . amLODipine (NORVASC) 5 MG tablet Take 5 mg by mouth daily.    Marland Kitchen aspirin 81 MG chewable tablet Chew 1 tablet (81 mg total) by mouth daily.    Marland Kitchen atorvastatin (LIPITOR) 40 MG tablet Take 1 tablet (40 mg total) by mouth daily at 6 PM. 90 tablet 3  . carvedilol (COREG) 6.25 MG tablet Take 1 tablet (6.25 mg total) by mouth 2 (two) times daily with a meal. 180 tablet 3  . clopidogrel (PLAVIX) 75 MG tablet Take 1 tablet (75 mg total) by mouth daily. 90 tablet 3  . Multiple Vitamins-Minerals (PRESERVISION AREDS 2) CAPS Take 2 capsules by mouth daily.    . nitroGLYCERIN (NITROSTAT) 0.4 MG SL tablet Place 1 tablet (0.4 mg total) under the tongue every 5 (five) minutes as needed for chest pain. 25 tablet 4  . sacubitril-valsartan (ENTRESTO) 97-103 MG Take 0.5 tablets by mouth 2 (two) times daily. 60 tablet 2   No current facility-administered medications for this visit.     Allergies:   Patient has no known allergies.    Social History:  The patient  reports that he has never smoked. His smokeless tobacco use includes chew. He reports that he does not drink alcohol or use drugs.   Family History:  The patient's family history includes Alcohol abuse in his father; Cirrhosis in his brother; Diabetes in his brother; Heart disease (age of onset: 54) in his mother; Migraines in his mother.    ROS: All other systems are reviewed and negative. Unless otherwise mentioned in H&P    PHYSICAL EXAM: VS:  BP 134/72 (BP Location: Left Arm)   Pulse 72   Ht 5\' 6"  (1.676 m)   Wt 164 lb 3.2 oz (74.5 kg)   BMI 26.50 kg/m  , BMI Body mass index is 26.5 kg/m. GEN: Well nourished, well developed, in no acute distress  HEENT: normal  Neck: no JVD, carotid bruits, or masses Cardiac: RRR; no  murmurs, rubs, or gallops,no edema  Respiratory:  clear to auscultation bilaterally, normal work of breathing GI: soft, nontender, nondistended, + BS MS: no deformity or atrophy  Skin: warm and dry, no rash Neuro:  Strength and sensation are intact Psych: euthymic mood, full affect   EKG:  Not completed this office visit.   Recent Labs: 02/28/2017: ALT 12 04/03/2017: Hemoglobin 12.9; Platelets 157 06/03/2017: BUN 14; Creatinine, Ser 0.85; Potassium 4.6; Sodium 139    Lipid Panel    Component Value Date/Time   CHOL 211 (H) 02/26/2017 0328   TRIG 153 (H) 02/26/2017 0328   HDL 41 02/26/2017 0328   CHOLHDL 5.1 02/26/2017 0328   VLDL 31 02/26/2017 0328   LDLCALC 139 (H) 02/26/2017 0328      Wt Readings from  Last 3 Encounters:  06/25/17 164 lb 3.2 oz (74.5 kg)  05/22/17 168 lb (76.2 kg)  05/15/17 168 lb (76.2 kg)      Other studies Reviewed: Echocardiogram 03-12-2017 Left ventricle: Mild to moderate LVH. Systolic function was   moderately reduced. The estimated ejection fraction was in the   range of 35% to 40%. Inferior and inferoseptal hypokinesis. - Aortic valve: No evidence of vegetation. - Mitral valve: There was mild regurgitation. - Left atrium: The atrium was dilated. No evidence of thrombus in   the atrial cavity or appendage. - Right atrium: No evidence of thrombus in the atrial cavity or   appendage. - Atrial septum: No defect or patent foramen ovale was identified. - Pulmonic valve: No evidence of vegetation.   ASSESSMENT AND PLAN:  1. Chronic Systolic CHF: He has no evidence of fluid overload at this time. He is very dizzy when he takes his medications and become very listless. I have completed orthostatic BP 's. His BP dropped from 142/83 to 125/82. He was symptomatic.   I will decrease the Entresto from 97/103 to 49 mg/51 mg.  Due to this significant drop. I will repeat his echocardiogram to evaluate if his LV fx has improved which will allow for less side  effects of dizziness.  He is on coreg as well. I will not make any adjustments with this yet, but suspect that this contributing to both depression and decreased libido. I will discuss this further with Dr. [name] once echo is resulted. He is very tearful, actively crying when he talks about the decreased desire and not feeling like himself. I have given some reassurance that these medications can affect libido and erection.   2. CAD: Hx of stent to RCA in March of 2019. He continues on Plavix and ASA. Coreg for HR control. No complaints of chest pain,   3. Hx of CVA: (2010), No residual neurologic deficits. Consider this as additional etiology of depression.   4. Depression: Very tearful, crying, with hopelessness and lack of interest in daily activities. Consider antidepressant therapy. Will be deferred to PCP for recommendations or counseling if necessary.      Current medicines are reviewed at length with the patient today.    Labs/ tests ordered today include: Echocardiogram.   Phill Myron. West Pugh, ANP, AACC   06/25/2017 2:59 PM    [facility] [address], [place], [place] [address] Phone: [phone]; Fax: [phone]

## 2017-06-26 ENCOUNTER — Ambulatory Visit (HOSPITAL_COMMUNITY): Payer: Medicare HMO | Attending: Cardiovascular Disease

## 2017-06-26 ENCOUNTER — Other Ambulatory Visit: Payer: Self-pay

## 2017-06-26 DIAGNOSIS — R0989 Other specified symptoms and signs involving the circulatory and respiratory systems: Secondary | ICD-10-CM | POA: Insufficient documentation

## 2017-06-26 DIAGNOSIS — I509 Heart failure, unspecified: Secondary | ICD-10-CM | POA: Diagnosis not present

## 2017-06-26 DIAGNOSIS — Z8673 Personal history of transient ischemic attack (TIA), and cerebral infarction without residual deficits: Secondary | ICD-10-CM | POA: Insufficient documentation

## 2017-06-26 DIAGNOSIS — I110 Hypertensive heart disease with heart failure: Secondary | ICD-10-CM | POA: Diagnosis not present

## 2017-06-26 DIAGNOSIS — I519 Heart disease, unspecified: Secondary | ICD-10-CM

## 2017-06-26 DIAGNOSIS — I340 Nonrheumatic mitral (valve) insufficiency: Secondary | ICD-10-CM | POA: Diagnosis not present

## 2017-06-26 LAB — BASIC METABOLIC PANEL
BUN/Creatinine Ratio: 14 (ref 10–24)
BUN: 13 mg/dL (ref 8–27)
CALCIUM: 9.5 mg/dL (ref 8.6–10.2)
CO2: 23 mmol/L (ref 20–29)
Chloride: 106 mmol/L (ref 96–106)
Creatinine, Ser: 0.93 mg/dL (ref 0.76–1.27)
GFR, EST AFRICAN AMERICAN: 97 mL/min/{1.73_m2} (ref >59)
GFR, EST NON AFRICAN AMERICAN: 84 mL/min/{1.73_m2} (ref >59)
Glucose: 106 mg/dL — ABNORMAL HIGH (ref 65–99)
POTASSIUM: 5.1 mmol/L (ref 3.5–5.2)
SODIUM: 141 mmol/L (ref 134–144)

## 2017-07-07 ENCOUNTER — Ambulatory Visit (INDEPENDENT_AMBULATORY_CARE_PROVIDER_SITE_OTHER): Payer: Medicare HMO | Admitting: *Deleted

## 2017-07-07 ENCOUNTER — Encounter: Payer: Self-pay | Admitting: Cardiovascular Disease

## 2017-07-07 ENCOUNTER — Ambulatory Visit: Payer: Medicare HMO | Admitting: Cardiovascular Disease

## 2017-07-07 VITALS — BP 122/60 | HR 61 | Ht 66.0 in | Wt 165.0 lb

## 2017-07-07 DIAGNOSIS — I10 Essential (primary) hypertension: Secondary | ICD-10-CM | POA: Diagnosis not present

## 2017-07-07 DIAGNOSIS — I639 Cerebral infarction, unspecified: Secondary | ICD-10-CM

## 2017-07-07 DIAGNOSIS — I2510 Atherosclerotic heart disease of native coronary artery without angina pectoris: Secondary | ICD-10-CM | POA: Diagnosis not present

## 2017-07-07 DIAGNOSIS — Z8673 Personal history of transient ischemic attack (TIA), and cerebral infarction without residual deficits: Secondary | ICD-10-CM

## 2017-07-07 DIAGNOSIS — I25810 Atherosclerosis of coronary artery bypass graft(s) without angina pectoris: Secondary | ICD-10-CM | POA: Insufficient documentation

## 2017-07-07 DIAGNOSIS — E782 Mixed hyperlipidemia: Secondary | ICD-10-CM

## 2017-07-07 DIAGNOSIS — I5042 Chronic combined systolic (congestive) and diastolic (congestive) heart failure: Secondary | ICD-10-CM | POA: Diagnosis not present

## 2017-07-07 DIAGNOSIS — E1159 Type 2 diabetes mellitus with other circulatory complications: Secondary | ICD-10-CM

## 2017-07-07 DIAGNOSIS — R4584 Anhedonia: Secondary | ICD-10-CM | POA: Diagnosis not present

## 2017-07-07 NOTE — Progress Notes (Signed)
Carelink Summary Report / Loop Recorder 

## 2017-07-07 NOTE — Progress Notes (Signed)
Cardiology Office Note:    Date:  07/07/2017   ID:  GENESIS PAGET, DOB 04-19-1950, MRN [id number]  PCP:  Heinz Knuckles, MD  Cardiologist:  Sanda Klein, MD   Referring MD: Heinz Knuckles, MD   Chief Complaint  Patient presents with  . Follow-up  . Shortness of Breath  Left ventricular dysfunction, recent stroke  History of Present Illness:    Justin [name] is a 67 y.o. 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    In January 2019 , presented with CVA andhe underwent transesophageal echo which did not show embolic source and had a loop recorder placed.  However the echo evaluation showed evidence of moderate left ventricular systolic dysfunction with an ejection fraction of 35%, mild dilation of the left ventricular chamber and regional wall motion abnormalities with akinesis of the entire distribution of the right coronary artery and grade 2 diastolic dysfunction.  The echo abnormalities were new when compared to a study from 2010. Cath showed CAD and received a DES to proximal RCA March 2019, followed by improvement in LVEF to 40-45%).  After he completed inpatient rehab his balance problems appear to improve.  With more aggressive treatment for his hypertension and heart failure, he has developed some episodes of disorientation and poor balance, especially after changes in position.  We had to reduce the doses of carvedilol and Entresto due to these complaints.  He is still taking amlodipine and a moderate dose.  Blood pressure control is now excellent.  He is compliant with dual antiplatelet therapy and has not had any bleeding problems.  On statin his LDL cholesterol was excellent at 31, HDL 40, triglycerides mildly elevated at [name], May 2019). LDL cholesterol was 139 before  starting statin therapy.  He has diabetes mellitus that is controlled with diet alone, last hemoglobin A1c 6.5%.   He complains of fatigue.  He does not feel that he has the desire her stamina to perform his previous favorite activities such as fishing.  The patient specifically denies any chest pain at rest or with exertion, dyspnea at rest or with exertion, orthopnea, paroxysmal nocturnal dyspnea, syncope, palpitations, new focal neurological deficits, intermittent claudication, lower extremity edema, unexplained weight gain, cough, hemoptysis or wheezing.  He has a very peculiar complaint.  His loop recorder site is not tender, but it hurts every time he sneezes.  Past Medical History:  Diagnosis Date  . Abnormal echocardiogram 04/03/2017  . CAD S/P percutaneous coronary angioplasty 04/02/17 DES to RCA 04/02/2017  . Calcium oxalate renal stones   . Chewing tobacco use   . Gallstones    hx/o, s/p choleycystectomy  . H/O echocardiogram 10/2008   no wall thickness, EF 55%, mild mitral regurge  . Hyperlipidemia   . Hypertension    hx/o, noncompliance prior with medication  . Influenza vaccine refused 09/2013  . Rheumatic fever    age 29  . Stroke ([place]) 10/02/2008  . Tinnitus     Past Surgical History:  Procedure Laterality Date  . blood clot  1997   brain  . BRAIN SURGERY  1997   craniotomy, evacuation of hematoma  . CHOLECYSTECTOMY  09/06/2011   Procedure: LAPAROSCOPIC CHOLECYSTECTOMY WITH INTRAOPERATIVE CHOLANGIOGRAM;  Surgeon: Earnstine Regal, MD;  Location: [facility];  Service: General;  Laterality: N/A;  . COLONOSCOPY  01/04/14   normal. Dr. Owens Loffler  . CORONARY STENT INTERVENTION N/A 04/02/2017   Procedure: CORONARY STENT INTERVENTION;  Surgeon: Leonie Man, MD;  Location: [facility];  Service: Cardiovascular;  Laterality: N/A;  . Dental implant    . INTRAVASCULAR PRESSURE WIRE/FFR STUDY N/A 04/02/2017   Procedure: INTRAVASCULAR PRESSURE WIRE/FFR STUDY;  Surgeon: Leonie Man, MD;  Location: [facility] [place] [facility];  Service: Cardiovascular;  Laterality: N/A;  . JOINT REPLACEMENT  2004   LEFT BICEP REPAIR, RTC repair  . LEFT HEART CATH AND CORONARY ANGIOGRAPHY N/A 04/02/2017   Procedure: LEFT HEART CATH AND CORONARY ANGIOGRAPHY;  Surgeon: Leonie Man, MD;  Location: [facility];  Service: Cardiovascular;  Laterality: N/A;  . LOOP RECORDER INSERTION N/A 02/27/2017   Procedure: LOOP RECORDER INSERTION;  Surgeon: Sanda Klein, MD;  Location: [facility];  Service: Cardiovascular;  Laterality: N/A;  . LOOP RECORDER INSERTION N/A 02/27/2017   Procedure: LOOP RECORDER INSERTION;  Surgeon: Pixie Casino, MD;  Location: [facility];  Service: Cardiovascular;  Laterality: N/A;  . ROTATOR CUFF REPAIR     Right  . SIGMOIDOSCOPY    . TEE WITHOUT CARDIOVERSION N/A 02/27/2017   Procedure: TRANSESOPHAGEAL ECHOCARDIOGRAM (TEE);  Surgeon: Pixie Casino, MD;  Location: [facility];  Service: Cardiovascular;  Laterality: N/A;    Current Medications: Current Meds  Medication Sig  . amLODipine (NORVASC) 5 MG tablet Take 5 mg by mouth daily.  Marland Kitchen aspirin 81 MG chewable tablet Chew 1 tablet (81 mg total) by mouth daily.  Marland Kitchen atorvastatin (LIPITOR) 40 MG tablet Take 1 tablet (40 mg total) by mouth daily at 6 PM.  . carvedilol (COREG) 6.25 MG tablet Take 1 tablet (6.25 mg total) by mouth 2 (two) times daily with a meal. (Patient taking differently: Take 3.125 mg by mouth 2 (two) times daily with a meal. )  . clopidogrel (PLAVIX) 75 MG tablet Take 1 tablet (75 mg total) by mouth daily.  . Multiple Vitamins-Minerals (PRESERVISION AREDS 2) CAPS Take 2 capsules by mouth daily.  . nitroGLYCERIN (NITROSTAT) 0.4 MG SL tablet Place 1 tablet (0.4 mg total) under the tongue every 5 (five) minutes as needed for chest pain.  . sacubitril-valsartan (ENTRESTO) 97-103 MG Take 0.5 tablets by mouth 2 (two) times daily.     Allergies:   Patient has no known allergies.    Social History   Socioeconomic History  . Marital status: Single    Spouse name: Not on file  . Number of children: 2  . Years of education: Not on file  . Highest education level: Not on file  Occupational History    Employer: [employer]  . Financial resource strain: Not on file  . Food insecurity:    Worry: Not on file    Inability: Not on file  . Transportation needs:    Medical: Not on file    Non-medical: Not on file  Tobacco Use  . Smoking status: Never Smoker  . Smokeless tobacco: Current User    Types: Chew  Substance and Sexual Activity  . Alcohol use: No    Alcohol/week: 0.0 oz  . Drug use: No  . Sexual activity: Not on file  Lifestyle  . Physical activity:    Days per week: Not on file    Minutes per session: Not on file  . Stress: Not on file  Relationships  . Social connections:    Talks on phone:  Not on file    Gets together: Not on file    Attends religious service: Not on file    Active member of club or organization: Not on file    Attends meetings of clubs or organizations: Not on file    Relationship status: Not on file  Other Topics Concern  . Not on file  Social History Narrative   Single, has 2 sons, 15yo and 67yo, electrician, walks regularly, 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       Family History: The patient's family history includes Alcohol abuse in his father; Cirrhosis in his brother; Diabetes in his brother; Heart disease (age of onset: 53) in his mother; Migraines in his mother. There is no history of Colon cancer, Cancer, Stroke, Hypertension, or Hyperlipidemia.  ROS:   Please see the history of present illness.     All other systems reviewed and are negative.  EKGs/Labs/Other Studies Reviewed:    The following studies were reviewed today: Echo and TEE from hospital stay  EKG:  EKG is not ordered today.    Recent Labs: 02/28/2017: ALT 12 04/03/2017: Hemoglobin 12.9; Platelets 157 06/25/2017: BUN  13; Creatinine, Ser 0.93; Potassium 5.1; Sodium 141  Recent Lipid Panel    Component Value Date/Time   CHOL 211 (H) 02/26/2017 0328   TRIG 153 (H) 02/26/2017 0328   HDL 41 02/26/2017 0328   CHOLHDL 5.1 02/26/2017 0328   VLDL 31 02/26/2017 0328   LDLCALC 139 (H) 02/26/2017 0328   Jun 16, 2017, [facility] Total cholesterol 109, triglycerides 190, HDL 40, LDL 31 March 17, 2017  hemoglobin A1c 5.7%  Physical Exam:    VS:  BP 122/60 (BP Location: Left Arm, Patient Position: Sitting, Cuff Size: Normal)   Pulse 61   Ht 5\' 6"  (1.676 m)   Wt 165 lb (74.8 kg)   BMI 26.63 kg/m      Wt Readings from Last 3 Encounters:  07/07/17 165 lb (74.8 kg)  06/25/17 164 lb 3.2 oz (74.5 kg)  05/22/17 168 lb (76.2 kg)     General: Alert, oriented x3, no distress, minimally overweight Head: no evidence of trauma, PERRL, EOMI, no exophtalmos or lid lag, no myxedema, no xanthelasma; normal ears, nose and oropharynx Neck: normal jugular venous pulsations and no hepatojugular reflux; brisk carotid pulses without delay and no carotid bruits Chest: clear to auscultation, no signs of consolidation by percussion or palpation, normal fremitus, symmetrical and full respiratory excursions.  The loop recorder site appears very healthy and is not tender, red or swollen. Cardiovascular: normal position and quality of the apical impulse, regular rhythm, normal first and second heart sounds, no murmurs, rubs or gallops Abdomen: no tenderness or distention, no masses by palpation, no abnormal pulsatility or arterial bruits, normal bowel sounds, no hepatosplenomegaly Extremities: no clubbing, cyanosis or edema; 2+ radial, ulnar and brachial pulses bilaterally; 2+ right femoral, posterior tibial and dorsalis pedis pulses; 2+ left femoral, posterior tibial and dorsalis pedis pulses; no subclavian or femoral bruits Neurological: grossly nonfocal Psych: Normal mood and affect   ASSESSMENT:    1. Chronic combined  systolic and diastolic heart failure ([place])   2. Coronary artery disease involving native coronary artery of native heart without angina pectoris   3. Essential hypertension   4. Mixed hyperlipidemia   5. Controlled type 2 diabetes mellitus with other circulatory complication, without long-term current use of insulin ([place])   6. History of embolic stroke   7. Anhedonia  PLAN:    In order of problems listed above:  1. CHF: He appears to have NYHA functional class I-2 combined systolic and diastolic heart failure.  After starting Entresto, we had to retreat on the dose because of symptoms of hypotension.  This has improved, will try to stop his amlodipine in order to allow full dose of Entresto.  Clinically euvolemic without diuretics. 2. CAD: Never really had convincing history of angina.  There is evidence of some permanent injury in the distribution of the right coronary artery, but LVEF has improved following stent placement and optimization of heart failure medications. 3. HTN: Now that his blood pressure is well controlled we will try to discontinue the amlodipine and optimize the dose of Entresto.He may not tolerate a higher dose of carvedilol due to relative bradycardia. 4. HLP: Excellent response to statin, at target LDL well under 70. 5. DM: Well-controlled with diet only.  Advised a little bit of additional weight loss.  He has become more sedentary and has gained a little weight.  This will be disadvantageous and he needs to start exercising again. 6. Embolic CVA: pattern was strongly suggestive of embolic disease with involvement of multiple vascular territories.  His loop recorder has not shown evidence of atrial fibrillation to date.  Neurologically, he has recovered well. 7. Depression?:  Some of his complaints suggest anhedonia.  He went from being very healthy and active to having had a stroke and being diagnosed with heart failure and coronary artery disease over period of a few  weeks.  He may still be adjusting to his new health problems.  Encouraged him to discuss any symptoms of depression with his PCP.  I also encouraged him to go back fishing.  This is 1 of his favorite activities and he would participate in competitions.  Medication Adjustments/Labs and Tests Ordered: Current medicines are reviewed at length with the patient today.  Concerns regarding medicines are outlined above.  No orders of the defined types were placed in this encounter.  No orders of the defined types were placed in this encounter.   Signed, Sanda Klein, MD  07/07/2017 1:08 PM    [place]

## 2017-07-07 NOTE — Patient Instructions (Signed)
Dr [name] has recommended making the following medication changes: 1. STOP Amlodipine  Your physician has requested that you regularly monitor your blood pressure at home. Please use the same machine to check your blood pressure daily. Keep a record of your blood pressures using the log sheet provided. In 2 weeks, please report your readings back to Dr C. You may use our online patient portal 'MyChart' or you can call the office to speak with a nurse.  Your physician recommends that you schedule an appointment with one of our clinical pharmacists in 1 month for a blood pressure check.  Dr [name] recommends that you schedule a follow-up appointment in 4 months.  If you need a refill on your cardiac medications before your next appointment, please call your pharmacy.

## 2017-07-21 ENCOUNTER — Ambulatory Visit: Payer: Medicare HMO | Admitting: Cardiovascular Disease

## 2017-07-24 ENCOUNTER — Encounter (INDEPENDENT_AMBULATORY_CARE_PROVIDER_SITE_OTHER): Payer: 59 | Admitting: Ophthalmology

## 2017-07-24 ENCOUNTER — Encounter: Payer: Medicare HMO | Attending: Physical Medicine & Rehabilitation | Admitting: Physical Medicine & Rehabilitation

## 2017-07-24 ENCOUNTER — Encounter: Payer: Self-pay | Admitting: Physical Medicine & Rehabilitation

## 2017-07-24 ENCOUNTER — Other Ambulatory Visit: Payer: Self-pay

## 2017-07-24 VITALS — BP 171/82 | HR 66 | Ht 66.0 in | Wt 167.4 lb

## 2017-07-24 DIAGNOSIS — Z9049 Acquired absence of other specified parts of digestive tract: Secondary | ICD-10-CM | POA: Insufficient documentation

## 2017-07-24 DIAGNOSIS — Z87442 Personal history of urinary calculi: Secondary | ICD-10-CM | POA: Insufficient documentation

## 2017-07-24 DIAGNOSIS — I10 Essential (primary) hypertension: Secondary | ICD-10-CM | POA: Diagnosis present

## 2017-07-24 DIAGNOSIS — I69319 Unspecified symptoms and signs involving cognitive functions following cerebral infarction: Secondary | ICD-10-CM

## 2017-07-24 DIAGNOSIS — Z9189 Other specified personal risk factors, not elsewhere classified: Secondary | ICD-10-CM | POA: Diagnosis not present

## 2017-07-24 DIAGNOSIS — H43813 Vitreous degeneration, bilateral: Secondary | ICD-10-CM

## 2017-07-24 DIAGNOSIS — Z8673 Personal history of transient ischemic attack (TIA), and cerebral infarction without residual deficits: Secondary | ICD-10-CM | POA: Insufficient documentation

## 2017-07-24 DIAGNOSIS — R350 Frequency of micturition: Secondary | ICD-10-CM | POA: Insufficient documentation

## 2017-07-24 DIAGNOSIS — I639 Cerebral infarction, unspecified: Secondary | ICD-10-CM

## 2017-07-24 DIAGNOSIS — E785 Hyperlipidemia, unspecified: Secondary | ICD-10-CM | POA: Diagnosis not present

## 2017-07-24 DIAGNOSIS — H353131 Nonexudative age-related macular degeneration, bilateral, early dry stage: Secondary | ICD-10-CM

## 2017-07-24 DIAGNOSIS — H35033 Hypertensive retinopathy, bilateral: Secondary | ICD-10-CM

## 2017-07-24 DIAGNOSIS — E119 Type 2 diabetes mellitus without complications: Secondary | ICD-10-CM | POA: Diagnosis not present

## 2017-07-24 DIAGNOSIS — F17220 Nicotine dependence, chewing tobacco, uncomplicated: Secondary | ICD-10-CM | POA: Insufficient documentation

## 2017-07-24 NOTE — Patient Instructions (Signed)
Please follow up with Dr. [name], Neurology:   Address: [address], [place], [place] [address]  Phone: [phone]

## 2017-07-24 NOTE — Progress Notes (Addendum)
Subjective:    Patient ID: Justin [name], male    DOB: 07-30-1950, 67 y.o.   MRN: [id number]  HPI 67 year old right-handed male with history of hypertension, diabetes mellitus, and traumatic ICH with craniotomy in 1997, as well as CVA without residual deficit presents for follow for left basal ganglia, parietal, occipital, and right cerebellar infarcts.   Last clinic visit 05/14/17.  Wife supplements history.  Since that time, patient states he is not doing HEP, not ambulating because he does not feel like it.  His medications are being adjusted by Cardiology. He still has not followed up with Neurology. His BP remains elevated.  He is following up with Cards - notes reviewed, plan to increase Entresto. He is not checking his CBGs, stating he has never had to.    Pain Inventory Average Pain 0 Pain Right Now 0 My pain is no pain  In the last 24 hours, has pain interfered with the following? General activity 0 Relation with others 0 Enjoyment of life 10 What TIME of day is your pain at its worst? no pain Sleep (in general) Fair  Pain is worse with: no pain Pain improves with: no pain Relief from Meds: no pain  Mobility walk without assistance how many minutes can you walk? 60 ability to climb steps?  yes do you drive?  yes  Function retired  Neuro/Psych No problems in this area  Prior Studies Any changes since last visit?  no  Physicians involved in your care Any changes since last visit?  no   Family History  Problem Relation Age of Onset  . Heart disease Mother 59       MI  . Migraines Mother   . Cirrhosis Brother   . Alcohol abuse Father        lived to age 67  . Diabetes Brother   . Colon cancer Neg Hx   . Cancer Neg Hx   . Stroke Neg Hx   . Hypertension Neg Hx   . Hyperlipidemia Neg Hx    Social History   Socioeconomic History  . Marital status: Single    Spouse name: Not on file  . Number of children: 2  . Years of education: Not on file  .  Highest education level: Not on file  Occupational History    Employer: [employer]  . Financial resource strain: Not on file  . Food insecurity:    Worry: Not on file    Inability: Not on file  . Transportation needs:    Medical: Not on file    Non-medical: Not on file  Tobacco Use  . Smoking status: Never Smoker  . Smokeless tobacco: Current User    Types: Chew  Substance and Sexual Activity  . Alcohol use: No    Alcohol/week: 0.0 oz  . Drug use: No  . Sexual activity: Not on file  Lifestyle  . Physical activity:    Days per week: Not on file    Minutes per session: Not on file  . Stress: Not on file  Relationships  . Social connections:    Talks on phone: Not on file    Gets together: Not on file    Attends religious service: Not on file    Active member of club or organization: Not on file    Attends meetings of clubs or organizations: Not on file    Relationship status: Not on file  Other Topics Concern  . 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     Past Surgical History:  Procedure Laterality Date  . blood clot  1997   brain  . BRAIN SURGERY  1997   craniotomy, evacuation of hematoma  . CHOLECYSTECTOMY  09/06/2011   Procedure: LAPAROSCOPIC CHOLECYSTECTOMY WITH INTRAOPERATIVE CHOLANGIOGRAM;  Surgeon: Earnstine Regal, MD;  Location: [facility];  Service: General;  Laterality: N/A;  . COLONOSCOPY  01/04/14   normal. Dr. Owens Loffler  . CORONARY STENT INTERVENTION N/A 04/02/2017   Procedure: CORONARY STENT INTERVENTION;  Surgeon: Leonie Man, MD;  Location: [facility];  Service: Cardiovascular;  Laterality: N/A;  . Dental implant    . INTRAVASCULAR PRESSURE WIRE/FFR STUDY N/A 04/02/2017   Procedure: INTRAVASCULAR PRESSURE WIRE/FFR STUDY;  Surgeon: Leonie Man, MD;  Location: [facility];  Service: Cardiovascular;  Laterality:  N/A;  . JOINT REPLACEMENT  2004   LEFT BICEP REPAIR, RTC repair  . LEFT HEART CATH AND CORONARY ANGIOGRAPHY N/A 04/02/2017   Procedure: LEFT HEART CATH AND CORONARY ANGIOGRAPHY;  Surgeon: Leonie Man, MD;  Location: [facility] [facility];  Service: Cardiovascular;  Laterality: N/A;  . LOOP RECORDER INSERTION N/A 02/27/2017   Procedure: LOOP RECORDER INSERTION;  Surgeon: Sanda Klein, MD;  Location: [facility];  Service: Cardiovascular;  Laterality: N/A;  . LOOP RECORDER INSERTION N/A 02/27/2017   Procedure: LOOP RECORDER INSERTION;  Surgeon: Pixie Casino, MD;  Location: [facility];  Service: Cardiovascular;  Laterality: N/A;  . ROTATOR CUFF REPAIR     Right  . SIGMOIDOSCOPY    . TEE WITHOUT CARDIOVERSION N/A 02/27/2017   Procedure: TRANSESOPHAGEAL ECHOCARDIOGRAM (TEE);  Surgeon: Pixie Casino, MD;  Location: [facility];  Service: Cardiovascular;  Laterality: N/A;   Past Medical History:  Diagnosis Date  . Abnormal echocardiogram 04/03/2017  . CAD S/P percutaneous coronary angioplasty 04/02/17 DES to RCA 04/02/2017  . Calcium oxalate renal stones   . Chewing tobacco use   . Gallstones    hx/o, s/p choleycystectomy  . H/O echocardiogram 10/2008   no wall thickness, EF 55%, mild mitral regurge  . Hyperlipidemia   . Hypertension    hx/o, noncompliance prior with medication  . Influenza vaccine refused 09/2013  . Rheumatic fever    age 24  . Stroke ([place]) 10/02/2008  . Tinnitus    BP (!) 171/82   Pulse 66   Ht 5\' 6"  (1.676 m)   Wt 167 lb 6.4 oz (75.9 kg)   SpO2 96%   BMI 27.02 kg/m   Opioid Risk Score:   Fall Risk Score:  `1  Depression screen PHQ 2/9  Depression screen [facility] 2/9 07/24/2017 03/14/2017 04/01/2016 02/24/2015  Decreased Interest 0 0 0 0  Down, Depressed, Hopeless 0 0 0 0  PHQ - 2 Score 0 0 0 0  Altered sleeping - 1 - -  Tired, decreased energy - 1 - -  Change in appetite - 0 - -  Feeling bad or failure about yourself  - 0 - -  Trouble concentrating - 0  - -  Moving slowly or fidgety/restless - 0 - -  Suicidal thoughts - 0 - -  PHQ-9 Score - 2 - -  Difficult doing work/chores - Not difficult at all - -   Review of Systems  Constitutional: Positive for fatigue.  HENT: Negative.   Eyes: Negative.  Respiratory: Negative.   Cardiovascular: Negative.   Gastrointestinal: Negative.   Endocrine: Negative.        High blood sugar  Genitourinary: Positive for frequency.  Musculoskeletal: Negative.   Skin: Negative.   Allergic/Immunologic: Negative.   Neurological: Negative.        Orthostasis  Hematological: Negative.   Psychiatric/Behavioral: Negative.   All other systems reviewed and are negative.     Objective:   Physical Exam Gen.: Well-developed, well-nourished. NAD. Head: Normocephalic. Atraumatic Eyes: EOM are normal. No discharge.  Cardiovascular: RRR and no JVD.  Respiratory: Effort normal breath sounds normal. No respiratory distress.  GI: Bowel sounds are normal. He exhibits no distension.  Musculoskeletal: He exhibits no edema or tenderness.  Neurological: He is alert and oriented.  Followed full commands Motor: RUE/RLE: 5/5 proximal to distal LUE/LLE: 5/5 proximal to distal  Skin. Warm and dry Psych: Normal mood and behavior.     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   1. Cognitive deficits, dysesthesias, secondary to 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 Status post loop recorder.    Encouraged HEP  Follow up with Neurology, needs appointment, reminded again - Dr. [name]  2. Hypertension.   Elevated today  Meds being adjusted by Cards  3. Diet controlled diabetes mellitus.   Now states he is not checking his CBGs because  he has never had to  4. Lack of motivation  Meds being adjusted by Cards  Recommended follow up with PCP if not improvement after cardiac meds adjusted

## 2017-08-07 ENCOUNTER — Encounter: Payer: Self-pay | Admitting: Pharmacist Clinician (PhC)/ Clinical Pharmacy Specialist

## 2017-08-07 ENCOUNTER — Ambulatory Visit (INDEPENDENT_AMBULATORY_CARE_PROVIDER_SITE_OTHER): Payer: Medicare HMO | Admitting: Pharmacist Clinician (PhC)/ Clinical Pharmacy Specialist

## 2017-08-07 ENCOUNTER — Other Ambulatory Visit: Payer: Self-pay | Admitting: Pharmacist Clinician (PhC)/ Clinical Pharmacy Specialist

## 2017-08-07 DIAGNOSIS — I5042 Chronic combined systolic (congestive) and diastolic (congestive) heart failure: Secondary | ICD-10-CM

## 2017-08-07 MED ORDER — SACUBITRIL-VALSARTAN 97-103 MG PO TABS: 1.0000 | ORAL_TABLET | Freq: Two times a day (BID) | ORAL | 5 refills | Status: DC

## 2017-08-07 NOTE — Assessment & Plan Note (Signed)
Patient with combined systolic and diastolic heart failure, last Echo improved to 40-45%.  He does not note any increase in ability to activities of daily living, but that is more due to his underlying depression and lack of interest/motivation.   Blood pressure elevated enough today that we can increase the Entresto to 97/103 mg twice daily.   He will return in a month with more home BP readings and at that time we can do another metabolic panel to assure that he is tolerating the full dose without problem.

## 2017-08-07 NOTE — Progress Notes (Signed)
08/07/2017 Justin [name] Mar 22, 1950 [id number]   HPI:  Justin [name] is a 67 y.o. male patient of Dr [name], with a PMH below who presents today for heart failure medication titration.   In addition to combined chronic systolic/diastolic heart failure, his medical history is significant for CAD, hypertension, hyperlipidemia, DM2, and depression/anhedonia.    His stroke was in January of this year.  Early on he had problems with balance and disorientation, and he states that it continues to be a problem at times.  He continues to have a lack of motivation, doesn't care if he sits in one place all day or not.  He can't say if he develops DOE, as he has not done anything to exert himself.    When he saw Dr. [name] his blood pressure was 122/60.  He had not been able to tolerate full dose Entresto, so he stopped the amlodipine.  Since then he took 4 home BP readings, which were all higher.  The hope was that we could now increase the Entresto to full dose.    Blood Pressure Goal:  130/80  Current Medications:  Entresto 49/51 mg bid  Carvedilol 6.25 mg bid  Family Hx:  Mother died from MI at age 56, 2 sons with hypertension  Social Hx:  Chews tobacco regularly, no alcohol, coffee and unsweet tea daily  Diet:  Eats out only rarely, otherwise home cooked.  Very little salt, none at table  Exercise:  Yard work and puttering around the [place] BP readings:  Checked x 4, average 150/89.  Note that his home cuff was checked earlier this year and noted to be about 10 points high both systolic and diastolic.  Intolerances:   nkda  Labs:  05/2017:  Na 141, K 5.1, Glu 106, BUN 13, SCr 0.93, CrCl 81.6  Wt Readings from Last 3 Encounters:  07/24/17 167 lb 6.4 oz (75.9 kg)  07/07/17 165 lb (74.8 kg)  06/25/17 164 lb 3.2 oz (74.5 kg)   BP Readings from Last 3 Encounters:  08/07/17 (!) 150/84  07/24/17 (!) 171/82  07/07/17 122/60   Pulse Readings from Last 3 Encounters:    08/07/17 66  07/24/17 66  07/07/17 61    Current Outpatient Medications  Medication Sig Dispense Refill  . aspirin 81 MG chewable tablet Chew 1 tablet (81 mg total) by mouth daily.    Marland Kitchen atorvastatin (LIPITOR) 40 MG tablet Take 1 tablet (40 mg total) by mouth daily at 6 PM. 90 tablet 3  . carvedilol (COREG) 6.25 MG tablet Take 1 tablet (6.25 mg total) by mouth 2 (two) times daily with a meal. (Patient taking differently: Take 3.125 mg by mouth 2 (two) times daily with a meal. ) 180 tablet 3  . clopidogrel (PLAVIX) 75 MG tablet Take 1 tablet (75 mg total) by mouth daily. 90 tablet 3  . Multiple Vitamins-Minerals (PRESERVISION AREDS 2) CAPS Take 2 capsules by mouth daily.    . nitroGLYCERIN (NITROSTAT) 0.4 MG SL tablet Place 1 tablet (0.4 mg total) under the tongue every 5 (five) minutes as needed for chest pain. 25 tablet 4  . sacubitril-valsartan (ENTRESTO) 97-103 MG Take 0.5 tablets by mouth 2 (two) times daily. 60 tablet 2   No current facility-administered medications for this visit.     No Known Allergies  Past Medical History:  Diagnosis Date  . Abnormal echocardiogram 04/03/2017  . CAD S/P percutaneous coronary angioplasty 04/02/17 DES to RCA 04/02/2017  .  Calcium oxalate renal stones   . Chewing tobacco use   . Gallstones    hx/o, s/p choleycystectomy  . H/O echocardiogram 10/2008   no wall thickness, EF 55%, mild mitral regurge  . Hyperlipidemia   . Hypertension    hx/o, noncompliance prior with medication  . Influenza vaccine refused 09/2013  . Rheumatic fever    age 7  . Stroke ([place]) 10/02/2008  . Tinnitus     Blood pressure (!) 150/84, pulse 66.  Chronic combined systolic and diastolic heart failure ([place]) Patient with combined systolic and diastolic heart failure, last Echo improved to 40-45%.  He does not note any increase in ability to activities of daily living, but that is more due to his underlying depression and lack of interest/motivation.   Blood pressure  elevated enough today that we can increase the Entresto to 97/103 mg twice daily.   He will return in a month with more home BP readings and at that time we can do another metabolic panel to assure that he is tolerating the full dose without problem.     Tommy Medal PharmD CPP [facility] [address] [place], [place] [address] [phone]

## 2017-08-07 NOTE — Patient Instructions (Addendum)
Return for a a follow up appointment in 1 month  Your blood pressure today is 150/84  Check your blood pressure at home every 2-3 days and keep record of the readings.  Take your BP meds as follows:  Increase the Entresto to 1 tablet twice daily  Continue with all your other medications  Bring all of your meds, your BP cuff and your record of home blood pressures to your next appointment.  Exercise as you're able, try to walk approximately 30 minutes per day.  Keep salt intake to a minimum, especially watch canned and prepared boxed foods.  Eat more fresh fruits and vegetables and fewer canned items.  Avoid eating in fast food restaurants.    HOW TO TAKE YOUR BLOOD PRESSURE: . Rest 5 minutes before taking your blood pressure. .  Don't smoke or drink caffeinated beverages for at least 30 minutes before. . Take your blood pressure before (not after) you eat. . Sit comfortably with your back supported and both feet on the floor (don't cross your legs). . Elevate your arm to heart level on a table or a desk. . Use the proper sized cuff. It should fit smoothly and snugly around your bare upper arm. There should be enough room to slip a fingertip under the cuff. The bottom edge of the cuff should be 1 inch above the crease of the elbow. . Ideally, take 3 measurements at one sitting and record the average.

## 2017-08-08 ENCOUNTER — Ambulatory Visit (INDEPENDENT_AMBULATORY_CARE_PROVIDER_SITE_OTHER): Payer: Medicare HMO | Admitting: *Deleted

## 2017-08-08 DIAGNOSIS — I639 Cerebral infarction, unspecified: Secondary | ICD-10-CM

## 2017-08-11 NOTE — Progress Notes (Signed)
Carelink Summary Report / Loop Recorder 

## 2017-08-12 LAB — CUP PACEART REMOTE DEVICE CHECK
Implantable Pulse Generator Implant Date: 20190131
MDC IDC SESS DTM: 20190609220625

## 2017-08-13 NOTE — Progress Notes (Signed)
[facility] [address] [place]. [place] [address] [phone]       OFFICE FOLLOW UP NOTE  Mr. Justin [name] Date of Birth:  Sep 29, 1950 Medical Record Number:  [id number]   Reason for Referral:  hospital stroke follow up  CHIEF COMPLAINT:  Chief Complaint  Patient presents with  . Follow-up    RM 9 with significant other.  Last seen 05/22/17. Takes ASA, clopidigrel, lipitor. Doing well, no new symptoms.     HPI: Justin [name] is being seen today for initial visit in the office for multi infarct stroke on 02/25/17. History obtained from patient and chart review. Reviewed all radiology images and labs personally.  Justin [name] is a 67 year old male with history of HTN, stroke in 2010, traumatic ICH status post craniotomy in 1997, HLD admitted for double vision, difficulty walking, right lower quadrantanopia and intermittent weakness.  CT no acute abnormality.  However, MRI showed left BG, left MCA/PCA and right cerebellum patchy punctate infarcts.  Examination showed right lower quadrantanopia.  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  EF 35-40%.  LDL 139 and A1c 6.5.  Patient denies any cardiac issues in the past.  Denies palpitation. TEE unremarkable, no PFO. Loop recorder placed. Cardiology consulted for cardiomyopathy and possible cardiac cath. Patient stated that he has been on aspirin 81 several years ago, he re-started aspirin 81 for the last several days and recommended increasing asa to 325 and plavix 75.  D/c to CIR where he made good recovery. On 04/02/17, patient underwent cardiac cath with stent placement and recommendation of DAPT with aspirin and plavix for 12 months.   History 05/15/17: Since discharge, patient has been doing well and is accompanied at today's appointment with his wife.  He states that he no longer has consistent double vision or vision problems.  Intermittently he may  have times where he cannot focus and feels disoriented and has to lay down for couple hours after.  Denies losing consciousness or any other associated symptoms.  He does state these happen more after working majority of the day are already feeling fatigued. Denies headaches or hx of migraines. Cardiologist recently increased Entresto.  Patient does have history of seizures will like to hold off on EEG monitoring to see if they subside.  Continue to take aspirin and Plavix with minor bruising but no bleeding.  Continue to take Lipitor without side effects or myalgias.  There is blood pressure satisfactory 120/70.  Loop recorder has not shown atrial fibrillation thus far.  He does admit to snoring, daytime sleepiness, and frequent napping but declines sleep study due to not wanting to wear CPAP or use oral device.  Advised patient of increased risk of cardiac disease and stroke including increased blood pressure, increased cholesterol and increase diabetic numbers if he does have untreated OSA but the patient continues to decline at this time.  Patient is back to doing all his usual activities without restrictions.  He has been trying to eat healthier and keep active.  PCP is assisting in managing HLD, DM and HTN.  Denies new or worsening stroke/TIA symptoms.  05/22/17 visit: Patient returns today for patient requested follow-up visit.  Recently underwent EEG which was a normal EEG without seizure activity.  Patient did have an episode 3 days ago when his mouth went numb, had double vision and hard time focusing.  This lasted approximately 1 hour, he took a nap  and woke up feeling better with all symptoms resolved.  Prior to this occurring, he just woke up in the morning, went to the kitchen to make coffee and sat down on the chair and started having the symptoms.  He states this is the worst one that he has had yet.  Did not check blood pressure during this episode but believes his blood pressure is not a factor.   Denies headache, weakness, extremity numbness or speech difficulties.  Wife believes that these episodes have been less in frequency.  Patient does continue to take Plavix and aspirin without side effects of bleeding or bruising.  Continues to take Lipitor without side effects of myalgias.  Blood pressure today satisfactory at 119/73.  Patient continues to stay active and he was able to go out the past 2 days to go fishing without these episodes occurring.  Loop recorder has not shown atrial fibrillation thus far.  08/14/17 UPDATE: Patient returns today for follow-up visit and is accompanied by his wife.  Overall he continues to do well from a stroke standpoint.  He does state that he continues to have floaters but has been worked up extensively by ophthalmology and at this time no surgery is recommended.  Continues to take both aspirin and Plavix without bleeding or bruising.  Continues to take atorvastatin without side effects myalgias.  Blood pressure today satisfactory 129/74 and he does continue to monitor this at home.  Patient continues to stay active and maintaining a healthy diet.  Loop recorder has not shown atrial fibrillation thus far.  Denies new or worsening stroke/TIA symptoms.    ROS:   14 system review of systems performed and negative with exception of dizziness  PMH:  Past Medical History:  Diagnosis Date  . Abnormal echocardiogram 04/03/2017  . CAD S/P percutaneous coronary angioplasty 04/02/17 DES to RCA 04/02/2017  . Calcium oxalate renal stones   . Chewing tobacco use   . Gallstones    hx/o, s/p choleycystectomy  . H/O echocardiogram 10/2008   no wall thickness, EF 55%, mild mitral regurge  . Hyperlipidemia   . Hypertension    hx/o, noncompliance prior with medication  . Influenza vaccine refused 09/2013  . Rheumatic fever    age 34  . Stroke ([place]) 10/02/2008  . Tinnitus     PSH:  Past Surgical History:  Procedure Laterality Date  . blood clot  1997   brain  . BRAIN  SURGERY  1997   craniotomy, evacuation of hematoma  . CHOLECYSTECTOMY  09/06/2011   Procedure: LAPAROSCOPIC CHOLECYSTECTOMY WITH INTRAOPERATIVE CHOLANGIOGRAM;  Surgeon: Earnstine Regal, MD;  Location: [facility];  Service: General;  Laterality: N/A;  . COLONOSCOPY  01/04/14   normal. Dr. Owens Loffler  . CORONARY STENT INTERVENTION N/A 04/02/2017   Procedure: CORONARY STENT INTERVENTION;  Surgeon: Leonie Man, MD;  Location: [facility];  Service: Cardiovascular;  Laterality: N/A;  . Dental implant    . INTRAVASCULAR PRESSURE WIRE/FFR STUDY N/A 04/02/2017   Procedure: INTRAVASCULAR PRESSURE WIRE/FFR STUDY;  Surgeon: Leonie Man, MD;  Location: [facility];  Service: Cardiovascular;  Laterality: N/A;  . JOINT REPLACEMENT  2004   LEFT BICEP REPAIR, RTC repair  . LEFT HEART CATH AND CORONARY ANGIOGRAPHY N/A 04/02/2017   Procedure: LEFT HEART CATH AND CORONARY ANGIOGRAPHY;  Surgeon: Leonie Man, MD;  Location: [facility];  Service: Cardiovascular;  Laterality: N/A;  . LOOP RECORDER INSERTION N/A 02/27/2017   Procedure: LOOP RECORDER  INSERTION;  Surgeon: Sanda Klein, MD;  Location: [facility];  Service: Cardiovascular;  Laterality: N/A;  . LOOP RECORDER INSERTION N/A 02/27/2017   Procedure: LOOP RECORDER INSERTION;  Surgeon: Pixie Casino, MD;  Location: [facility];  Service: Cardiovascular;  Laterality: N/A;  . ROTATOR CUFF REPAIR     Right  . SIGMOIDOSCOPY    . TEE WITHOUT CARDIOVERSION N/A 02/27/2017   Procedure: TRANSESOPHAGEAL ECHOCARDIOGRAM (TEE);  Surgeon: Pixie Casino, MD;  [facility];  Service: Cardiovascular;  Laterality: N/A;    Social History:  Social History   Socioeconomic History  . Marital status: Single    Spouse name: Not on file  . Number of children: 2  . Years of education: Not on file  . Highest education level: Not on file  Occupational History    Employer: [employer] Needs  . Financial resource  strain: Not on file  . Food insecurity:    Worry: Not on file    Inability: Not on file  . Transportation needs:    Medical: Not on file    Non-medical: Not on file  Tobacco Use  . Smoking status: Never Smoker  . Smokeless tobacco: Current User    Types: Chew  Substance and Sexual Activity  . Alcohol use: No    Alcohol/week: 0.0 oz  . Drug use: No  . Sexual activity: Not on file  Lifestyle  . Physical activity:    Days per week: Not on file    Minutes per session: Not on file  . Stress: Not on file  Relationships  . Social connections:    Talks on phone: Not on file    Gets together: Not on file    Attends religious service: Not on file    Active member of club or organization: Not on file    Attends meetings of clubs or organizations: Not on file    Relationship status: Not on file  . Intimate partner violence:    Fear of current or ex partner: Not on file    Emotionally abused: Not on file    Physically abused: Not on file    Forced sexual activity: Not on file  Other Topics Concern  . Not on file  Social History Narrative   Single, has 2 sons, 74yo and 84yo, electrician, walks regularly, 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      Family History:  Family History  Problem Relation Age of Onset  . Heart disease Mother 49       MI  . Migraines Mother   . Cirrhosis Brother   . Alcohol abuse Father        lived to age 15  . Diabetes Brother   . Colon cancer Neg Hx   . Cancer Neg Hx   . Stroke Neg Hx   . Hypertension Neg Hx   . Hyperlipidemia Neg Hx     Medications:   Current Outpatient Medications on File Prior to Visit  Medication Sig Dispense Refill  . aspirin 81 MG chewable tablet Chew 1 tablet (81 mg total) by mouth daily.    Marland Kitchen atorvastatin (LIPITOR) 40 MG tablet Take 1 tablet (40 mg total) by mouth daily at 6 PM. 90 tablet 3  . carvedilol (COREG) 6.25 MG tablet Take 1 tablet (6.25 mg total) by mouth 2 (two) times daily with a meal. (Patient taking  differently: Take 3.125 mg by mouth 2 (two) times daily with a meal. )  180 tablet 3  . clopidogrel (PLAVIX) 75 MG tablet Take 1 tablet (75 mg total) by mouth daily. 90 tablet 3  . Multiple Vitamins-Minerals (PRESERVISION AREDS 2) CAPS Take 2 capsules by mouth daily.    . nitroGLYCERIN (NITROSTAT) 0.4 MG SL tablet Place 1 tablet (0.4 mg total) under the tongue every 5 (five) minutes as needed for chest pain. 25 tablet 4  . sacubitril-valsartan (ENTRESTO) 97-103 MG Take 1 tablet by mouth 2 (two) times daily. 60 tablet 5   No current facility-administered medications on file prior to visit.     Allergies:  No Known Allergies   Physical Exam  Vitals:   08/14/17 0733  BP: 129/74  Pulse: 69  Weight: 166 lb 9.6 oz (75.6 kg)  Height: 5\' 6"  (1.676 m)   Body mass index is 26.89 kg/m. No exam data present  General: well developed, elderly Caucasian male, well nourished, seated, in no evident distress Head: head normocephalic and atraumatic.   Neck: supple with no carotid or supraclavicular bruits Cardiovascular: regular rate and rhythm, no murmurs Musculoskeletal: no deformity Skin:  no rash/petichiae Vascular:  Normal pulses all extremities  Neurologic Exam Mental Status: Awake and fully alert. Oriented to place and time. Recent and remote memory intact. Attention span, concentration and fund of knowledge appropriate. Mood and affect appropriate.  Cranial Nerves: Fundoscopic exam reveals sharp disc margins. Pupils equal, briskly reactive to light. Extraocular movements full without nystagmus. Visual fields full to confrontation. Hearing intact. Facial sensation intact. Face, tongue, palate moves normally and symmetrically.  Motor: Normal bulk and tone. Normal strength in all tested extremity muscles. Sensory.: intact to touch , pinprick , position and vibratory sensation.  Coordination: Rapid alternating movements normal in all extremities. Finger-to-nose and heel-to-shin performed  accurately bilaterally. Gait and Station: Arises from chair without difficulty. Stance is normal. Gait demonstrates normal stride length and balance . Able to heel, toe and tandem walk without difficulty.  Reflexes: 1+ and symmetric. Toes downgoing.      Diagnostic Data (Labs, Imaging, Testing)  Ct Head Wo Contrast Result Date: 02/25/2017 IMPRESSION: No acute intracranial abnormality. Electronically Signed   By: Rolm Baptise M.D.   On: 02/25/2017 17:20   Ct Angio Head/Neck W Or Wo Contrast Result Date: 02/26/2017 IMPRESSION: 1. Atherosclerosis most heavily involving the posterior circulation. 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 These stenoses lead to under opacification of left PCA vessels when compared to the right (which is fetal type). 2. Approximately 50% left supraclinoid ICA stenosis. 3. Atherosclerosis in the neck without flow limiting stenosis or ulceration.  [name] Result Date: 02/25/2017 IMPRESSION: 1. Patchy small volume acute ischemic nonhemorrhagic subcentimeter infarcts involving the left basal ganglia, left parieto-occipital region, and right cerebellar hemisphere as above. 2. Small remote left thalamic lacunar infarct. 3. Sequelae of prior left parietal craniotomy.   Echocardiogram:                                               Study Conclusions - Left ventricle: The cavity size was mildly dilated. Systolic function was moderately reduced. The estimated ejection fraction was in the range of 35% to 40%. There is akinesis of the entireinferolateral, inferior, and inferoseptal myocardium. Features are consistent with a pseudonormal left ventricular filling pattern, with concomitant abnormal relaxation and increased filling pressure (  grade 2 diastolic dysfunction). Doppler parameters are consistent with high ventricular filling pressure. - Aortic valve: Trileaflet; mildly thickened,  mildly calcified leaflets. - Mitral valve: There was mild regurgitation. - Left atrium: The atrium was mildly dilated.  TEE  02/27/17 Impressions: - No cardiac source of emboli was indentified.  Cardiac Cath 04/02/17  The left ventricular systolic function is normal.  Prox RCA-1 lesion is 70% stenosed. Prox RCA-2 lesion is 50% stenosed. -- FFR 0.8  A drug-eluting stent was successfully placed using a STENT SIERRA 2.75 X 18 MM.  Post intervention, there is a 0% residual stenosis.  Dist RCA lesion is 20% stenosed.  Mid LM to Dist LM lesion is 30% stenosed. Prox Cx lesion is 40% stenosed.  LV end diastolic pressure is mildly elevated.  EEG 05/19/2017 Impression: This is a normal EEG recording in the waking and drowsy state. No evidence of ictal or interictal discharges are seen.    ASSESSMENT: Justin [name] is a 67 y.o. year old male here with left BG, left parital/occipital, and right cerebellar infarcts on 02/15/17 secondary to possibly embolic. Vascular risk factors include HTN, HLD, and DM. Patient returns today for follow up visit after EEG.    PLAN: -Continue aspirin 81 mg daily and clopidogrel 75 mg daily  and lipitor  for secondary stroke prevention -F/u with PCP regarding your HLD, HTN, and DM management -Continue to follow with cardiologist regarding CAD -continue to monitor BP at home -Monitor loop recorder -Maintain strict control of hypertension with blood pressure goal below 130/90, diabetes with hemoglobin A1c goal below 6.5% and cholesterol with LDL cholesterol (bad cholesterol) goal below 70 mg/dL. I also advised the patient to eat a healthy diet with plenty of whole grains, cereals, fruits and vegetables, exercise regularly and maintain ideal body weight.  Follow-up as needed or call with questions/concerns   Greater than 50% time during this 25 minute consultation visit was spent on counseling and coordination of care about HLD, HTN and DM, discussion  about risk benefit of anticoagulation and answering questions.   Venancio Poisson, AGNP-BC  [facility] [address] [place], [place] [address]  Phone [phone] Fax [phone]

## 2017-08-14 ENCOUNTER — Ambulatory Visit: Payer: Medicare HMO | Admitting: Adult Health

## 2017-08-14 ENCOUNTER — Telehealth: Payer: Self-pay | Admitting: Cardiovascular Disease

## 2017-08-14 ENCOUNTER — Other Ambulatory Visit: Payer: Self-pay

## 2017-08-14 ENCOUNTER — Encounter: Payer: Self-pay | Admitting: Adult Health

## 2017-08-14 VITALS — BP 129/74 | HR 69 | Ht 66.0 in | Wt 166.6 lb

## 2017-08-14 DIAGNOSIS — I10 Essential (primary) hypertension: Secondary | ICD-10-CM | POA: Diagnosis not present

## 2017-08-14 DIAGNOSIS — E785 Hyperlipidemia, unspecified: Secondary | ICD-10-CM

## 2017-08-14 DIAGNOSIS — I639 Cerebral infarction, unspecified: Secondary | ICD-10-CM

## 2017-08-14 MED ORDER — CARVEDILOL 6.25 MG PO TABS: 3.1250 mg | ORAL_TABLET | Freq: Two times a day (BID) | ORAL | 6 refills | Status: DC

## 2017-08-14 NOTE — Telephone Encounter (Signed)
New Message       *STAT* If patient is at the pharmacy, call can be transferred to refill team.   1. Which medications need to be refilled? (please list name of each medication and dose if known) carvedilol (COREG) 6.25 MG tablet  2. Which pharmacy/location (including street and city if local pharmacy) is medication to be sent to? [facility] [address]  3. Do they need a 30 day or 90 day supply? 90 Patient states it should be reduced to 3.1.5 from the 6.25

## 2017-08-14 NOTE — Telephone Encounter (Signed)
Rx request sent to pharmacy.  

## 2017-08-14 NOTE — Patient Instructions (Addendum)
Continue aspirin 81 mg daily and clopidogrel 75 mg daily  and lipitor  for secondary stroke prevention  Continue to follow up with PCP regarding cholesterol and blood pressure management   Continue to stay active and eat healthy  Continue to monitor blood pressure at home  Maintain strict control of hypertension with blood pressure goal below 130/90, diabetes with hemoglobin A1c goal below 6.5% and cholesterol with LDL cholesterol (bad cholesterol) goal below 70 mg/dL. I also advised the patient to eat a healthy diet with plenty of whole grains, cereals, fruits and vegetables, exercise regularly and maintain ideal body weight.  Followup in the future with me as needed or call earlier if needed         Thank you for coming to see [place] at [facility]. I hope we have been able to provide you high quality care today.  You may receive a patient satisfaction survey over the next few weeks. We would appreciate your feedback and comments so that we may continue to improve ourselves and the health of our patients.

## 2017-08-16 NOTE — Progress Notes (Signed)
I agree with the above plan 

## 2017-09-02 ENCOUNTER — Ambulatory Visit (INDEPENDENT_AMBULATORY_CARE_PROVIDER_SITE_OTHER): Payer: Medicare HMO | Admitting: Pharmacist Clinician (PhC)/ Clinical Pharmacy Specialist

## 2017-09-02 VITALS — BP 148/86 | HR 66

## 2017-09-02 DIAGNOSIS — I5042 Chronic combined systolic (congestive) and diastolic (congestive) heart failure: Secondary | ICD-10-CM

## 2017-09-02 LAB — BASIC METABOLIC PANEL
BUN / CREAT RATIO: 14 (ref 10–24)
BUN: 12 mg/dL (ref 8–27)
CHLORIDE: 105 mmol/L (ref 96–106)
CO2: 21 mmol/L (ref 20–29)
Calcium: 9.1 mg/dL (ref 8.6–10.2)
Creatinine, Ser: 0.87 mg/dL (ref 0.76–1.27)
GFR calc non Af Amer: 89 mL/min/{1.73_m2} (ref >59)
GFR, EST AFRICAN AMERICAN: 103 mL/min/{1.73_m2} (ref >59)
Glucose: 143 mg/dL — ABNORMAL HIGH (ref 65–99)
POTASSIUM: 4.3 mmol/L (ref 3.5–5.2)
Sodium: 139 mmol/L (ref 134–144)

## 2017-09-02 NOTE — Progress Notes (Signed)
09/02/2017 ZIAIR PENSON 04/15/1950 [id number]   HPI:  Justin [name] is a 67 y.o. male patient of Dr [name], with a PMH below who presents today for heart failure medication titration.   In addition to combined chronic systolic/diastolic heart failure, his medical history is significant for CAD, hypertension, hyperlipidemia, DM2, and depression/anhedonia.    His stroke was in January of this year.  Early on he had problems with balance and disorientation, and he states that it continues to be a problem at times.  His bigger issue is apathy/anhedonia.  He has no interest in doing things, would be fine to just sit in one place all day.  He has been out fishing the last several days, but admits that he doesn't go out because he wants to or is interested, he just goes.       When he was started on Entresto his blood pressure supported the 49/51 mg dose without problem.  However the higher dose brought his pressure down too low, so he was resumed on the 49/51 mg dose.  His amlodipine was stopped and at his last visit his pressure was 150/84, so we asked him to increase the [facility] again.  He returns today for follow up.    He has not increased his Entresto as we asked.  When asked why he did not do this, his response was that he "didn't feel the need".  Stated his blood pressure was fine, and the last time he took the full dose, he became lightheaded.  He also admits to 2 separate episodes of SOB, one after working in the grass on a sprinkler system, the other when he was just sitting in the house.  He states each lasted an hour or two.  I advised that when this happens he needs to go to the ER or UC so that a cause can be determined, but he refuses.    Blood Pressure Goal:  130/80  Current Medications:  Entresto 97/103 mg bid  Carvedilol 6.25 mg bid  Family Hx:  Mother died from MI at age 56, 2 sons with hypertension  Social Hx:  Chews tobacco regularly, no alcohol; coffee and unsweet  tea daily  Diet:  Eats out only rarely, otherwise home cooked.  Adding salt to his tomatoes daily (getting fresh from neighboring farm)  Exercise:  Yard work and puttering around [employer]; did go fishing x 4 days  Home BP readings:  Did not check since last visit  Intolerances:   nkda  Labs:  05/2017:  Na 141, K 5.1, Glu 106, BUN 13, SCr 0.93, CrCl 81.6  Wt Readings from Last 3 Encounters:  08/14/17 166 lb 9.6 oz (75.6 kg)  07/24/17 167 lb 6.4 oz (75.9 kg)  07/07/17 165 lb (74.8 kg)   BP Readings from Last 3 Encounters:  09/02/17 (!) 148/86  08/14/17 129/74  08/07/17 (!) 150/84   Pulse Readings from Last 3 Encounters:  09/02/17 66  08/14/17 69  08/07/17 66    Current Outpatient Medications  Medication Sig Dispense Refill  . aspirin 81 MG chewable tablet Chew 1 tablet (81 mg total) by mouth daily.    Marland Kitchen atorvastatin (LIPITOR) 40 MG tablet Take 1 tablet (40 mg total) by mouth daily at 6 PM. 90 tablet 3  . carvedilol (COREG) 6.25 MG tablet Take 0.5 tablets (3.125 mg total) by mouth 2 (two) times daily with a meal. 30 tablet 6  . clopidogrel (PLAVIX) 75 MG  tablet Take 1 tablet (75 mg total) by mouth daily. 90 tablet 3  . Multiple Vitamins-Minerals (PRESERVISION AREDS 2) CAPS Take 2 capsules by mouth daily.    . nitroGLYCERIN (NITROSTAT) 0.4 MG SL tablet Place 1 tablet (0.4 mg total) under the tongue every 5 (five) minutes as needed for chest pain. 25 tablet 4  . sacubitril-valsartan (ENTRESTO) 97-103 MG Take 1 tablet by mouth 2 (two) times daily. 60 tablet 5   No current facility-administered medications for this visit.     No Known Allergies  Past Medical History:  Diagnosis Date  . Abnormal echocardiogram 04/03/2017  . CAD S/P percutaneous coronary angioplasty 04/02/17 DES to RCA 04/02/2017  . Calcium oxalate renal stones   . Chewing tobacco use   . Gallstones    hx/o, s/p choleycystectomy  . H/O echocardiogram 10/2008   no wall thickness, EF 55%, mild mitral regurge    . Hyperlipidemia   . Hypertension    hx/o, noncompliance prior with medication  . Influenza vaccine refused 09/2013  . Rheumatic fever    age 70  . Stroke ([place]) 10/02/2008  . Tinnitus     Blood pressure (!) 148/86, pulse 66.  Chronic combined systolic and diastolic heart failure ([place]) Patient with continued elevated blood pressure and not yet on full dose of Entresto.  Explained in detail the need to increase dose and that his pressure is elevated enough to support the higher dose.  Will also repeat BMET today to ensure stable electrolytes (his last potassium was 5.1).  He will return in a month for follow up.  He asked about new BP cuffs, as his was not terribly accurate, so I gave recommendation to get an Omron cuff if he could.     Tommy Medal PharmD CPP [facility] [address] [place],  [address] [phone]

## 2017-09-02 NOTE — Patient Instructions (Signed)
Return for a a follow up appointment on Sept 3  If you are unable to tolerate the full dose of Entresto, please call [phone]  Erasmo Downer or Raquel)  Go to the lab today for blood work  Your blood pressure today is 148/86  If you're looking for a new BP cuff, look at the Omron brand of arm cuffs   Check your blood pressure at home daily (if able) and keep record of the readings.  Take your BP meds as follows:  Increase Entresto to 97/103 mg (whole tablet) twice daily  Continue with all other medications  Bring all of your meds, your BP cuff and your record of home blood pressures to your next appointment.  Exercise as you're able, try to walk approximately 30 minutes per day.  Keep salt intake to a minimum, especially watch canned and prepared boxed foods.  Eat more fresh fruits and vegetables and fewer canned items.  Avoid eating in fast food restaurants.    HOW TO TAKE YOUR BLOOD PRESSURE: . Rest 5 minutes before taking your blood pressure. .  Don't smoke or drink caffeinated beverages for at least 30 minutes before. . Take your blood pressure before (not after) you eat. . Sit comfortably with your back supported and both feet on the floor (don't cross your legs). . Elevate your arm to heart level on a table or a desk. . Use the proper sized cuff. It should fit smoothly and snugly around your bare upper arm. There should be enough room to slip a fingertip under the cuff. The bottom edge of the cuff should be 1 inch above the crease of the elbow. . Ideally, take 3 measurements at one sitting and record the average.

## 2017-09-02 NOTE — Assessment & Plan Note (Signed)
Patient with continued elevated blood pressure and not yet on full dose of Entresto.  Explained in detail the need to increase dose and that his pressure is elevated enough to support the higher dose.  Will also repeat BMET today to ensure stable electrolytes (his last potassium was 5.1).  He will return in a month for follow up.  He asked about new BP cuffs, as his was not terribly accurate, so I gave recommendation to get an Omron cuff if he could.

## 2017-09-04 ENCOUNTER — Encounter (INDEPENDENT_AMBULATORY_CARE_PROVIDER_SITE_OTHER): Payer: 59 | Admitting: Ophthalmology

## 2017-09-04 DIAGNOSIS — H353131 Nonexudative age-related macular degeneration, bilateral, early dry stage: Secondary | ICD-10-CM | POA: Diagnosis not present

## 2017-09-04 DIAGNOSIS — H43813 Vitreous degeneration, bilateral: Secondary | ICD-10-CM

## 2017-09-10 ENCOUNTER — Ambulatory Visit (INDEPENDENT_AMBULATORY_CARE_PROVIDER_SITE_OTHER): Payer: Medicare HMO | Admitting: *Deleted

## 2017-09-10 DIAGNOSIS — I639 Cerebral infarction, unspecified: Secondary | ICD-10-CM

## 2017-09-11 NOTE — Progress Notes (Signed)
Carelink Summary Report / Loop Recorder 

## 2017-09-18 LAB — CUP PACEART REMOTE DEVICE CHECK
Date Time Interrogation Session: 20190712220602
MDC IDC PG IMPLANT DT: 20190131

## 2017-09-26 ENCOUNTER — Encounter

## 2017-09-30 ENCOUNTER — Ambulatory Visit (INDEPENDENT_AMBULATORY_CARE_PROVIDER_SITE_OTHER): Payer: Medicare HMO | Admitting: Pharmacist Clinician (PhC)/ Clinical Pharmacy Specialist

## 2017-09-30 DIAGNOSIS — I5042 Chronic combined systolic (congestive) and diastolic (congestive) heart failure: Secondary | ICD-10-CM

## 2017-09-30 MED ORDER — CARVEDILOL 3.125 MG PO TABS: 3.1250 mg | ORAL_TABLET | Freq: Two times a day (BID) | ORAL | 3 refills | Status: DC

## 2017-09-30 NOTE — Progress Notes (Signed)
09/30/2017 Justin [name] [birth year] [id number]   HPI:  Justin [name] is a 67 y.o. male patient of Dr [name], with a PMH below who presents today for heart failure medication titration.   In addition to combined chronic systolic/diastolic heart failure, his medical history is significant for CAD, hypertension, hyperlipidemia, DM2, and depression/anhedonia.    His stroke was in January of this year.  Early on he had problems with balance and disorientation, and he states that it continues to be a problem at times.  His bigger issue is apathy/anhedonia.  He has no interest in doing things, would be fine to just sit in one place all day.    In the past few months we have tried to get him to [facility] 97/103, and after stopping amlodipine, have been able to get him there while maintaining a stable blood pressure.  He continues with carvedilol 3.125 mg twice daily as well.  Since his last visit, he appears to be getting out a little more than previous, having gone dove hunting last week and putting mulch out in his yard.   He still talks about going back to the [facility], but has not yet done that.    Other than tiring easily when he walks, he has no specific complaints today.    Blood Pressure Goal:  130/80  Current Medications:  Entresto 97/103 mg bid  Carvedilol 3.125 mg bid  Family Hx:  Mother died from MI at age 72, 2 sons with hypertension  Social Hx:  Chews tobacco regularly, no alcohol; coffee and unsweet tea daily  Diet:  Eats out only rarely, otherwise home cooked.  Adding salt to his tomatoes daily (getting fresh from neighboring farm)  Exercise:  Has been more active than usual, walked 1/4 mile to go dove hunting, but admitted to being worn out  Home BP readings:  Bought new cuff since last visit - 15 readings average 127/72 HR 68  (range 111-143/59-82)  Intolerances:   nkda  Labs:  05/2017:  Na 141, K 5.1, Glu 106, BUN 13, SCr 0.93, CrCl 81.6  08/2016:  Na 139, K  4.3, Glu 143, BUN 12, SCr 0.87, CrCl 86  Wt Readings from Last 3 Encounters:  08/14/17 166 lb 9.6 oz (75.6 kg)  07/24/17 167 lb 6.4 oz (75.9 kg)  07/07/17 165 lb (74.8 kg)   BP Readings from Last 3 Encounters:  09/30/17 122/74  09/02/17 (!) 148/86  08/14/17 129/74   Pulse Readings from Last 3 Encounters:  09/30/17 64  09/02/17 66  08/14/17 69    Current Outpatient Medications  Medication Sig Dispense Refill  . aspirin 81 MG chewable tablet Chew 1 tablet (81 mg total) by mouth daily.    Marland Kitchen atorvastatin (LIPITOR) 40 MG tablet Take 1 tablet (40 mg total) by mouth daily at 6 PM. 90 tablet 3  . carvedilol (COREG) 3.125 MG tablet Take 1 tablet (3.125 mg total) by mouth 2 (two) times daily. 180 tablet 3  . clopidogrel (PLAVIX) 75 MG tablet Take 1 tablet (75 mg total) by mouth daily. 90 tablet 3  . Multiple Vitamins-Minerals (PRESERVISION AREDS 2) CAPS Take 2 capsules by mouth daily.    . nitroGLYCERIN (NITROSTAT) 0.4 MG SL tablet Place 1 tablet (0.4 mg total) under the tongue every 5 (five) minutes as needed for chest pain. 25 tablet 4  . sacubitril-valsartan (ENTRESTO) 97-103 MG Take 1 tablet by mouth 2 (two) times daily. 60 tablet 5   No  current facility-administered medications for this visit.     No Known Allergies  Past Medical History:  Diagnosis Date  . Abnormal echocardiogram 04/03/2017  . CAD S/P percutaneous coronary angioplasty 04/02/17 DES to RCA 04/02/2017  . Calcium oxalate renal stones   . Chewing tobacco use   . Gallstones    hx/o, s/p choleycystectomy  . H/O echocardiogram 10/2008   no wall thickness, EF 55%, mild mitral regurge  . Hyperlipidemia   . Hypertension    hx/o, noncompliance prior with medication  . Influenza vaccine refused 09/2013  . Rheumatic fever    age 21  . Stroke ([place]) 10/02/2008  . Tinnitus     Blood pressure 122/74, pulse 64.  Chronic combined systolic and diastolic heart failure ([place]) Patient now stable on Entresto 97/103 mg twice daily.   Seems to be complaint with medications.  Will have him continue with current medications and advised that he schedule follow up with Dr. [name] for November.     Tommy Medal PharmD CPP [facility] [address] [place], [place] [address] [phone]

## 2017-09-30 NOTE — Assessment & Plan Note (Signed)
Patient now stable on Entresto 97/103 mg twice daily.  Seems to be complaint with medications.  Will have him continue with current medications and advised that he schedule follow up with Dr. [name] for November.

## 2017-09-30 NOTE — Patient Instructions (Addendum)
You are due to see Dr. [name] in November.  Please call soon to schedule that appointment.   Your blood pressure today is 122/74  Check your blood pressure at home daily and keep record of the readings.  Continue with all current medications  Bring all of your meds, your BP cuff and your record of home blood pressures to your next appointment.  Exercise as you're able, try to walk approximately 30 minutes per day.  Keep salt intake to a minimum, especially watch canned and prepared boxed foods.  Eat more fresh fruits and vegetables and fewer canned items.  Avoid eating in fast food restaurants.    HOW TO TAKE YOUR BLOOD PRESSURE: . Rest 5 minutes before taking your blood pressure. .  Don't smoke or drink caffeinated beverages for at least 30 minutes before. . Take your blood pressure before (not after) you eat. . Sit comfortably with your back supported and both feet on the floor (don't cross your legs). . Elevate your arm to heart level on a table or a desk. . Use the proper sized cuff. It should fit smoothly and snugly around your bare upper arm. There should be enough room to slip a fingertip under the cuff. The bottom edge of the cuff should be 1 inch above the crease of the elbow. . Ideally, take 3 measurements at one sitting and record the average.

## 2017-09-30 NOTE — Progress Notes (Signed)
Thank you MCr 

## 2017-10-13 ENCOUNTER — Ambulatory Visit (INDEPENDENT_AMBULATORY_CARE_PROVIDER_SITE_OTHER): Payer: Medicare HMO | Admitting: *Deleted

## 2017-10-13 DIAGNOSIS — I639 Cerebral infarction, unspecified: Secondary | ICD-10-CM

## 2017-10-14 NOTE — Progress Notes (Signed)
Carelink Summary Report / Loop Recorder 

## 2017-10-16 LAB — CUP PACEART REMOTE DEVICE CHECK
Implantable Pulse Generator Implant Date: 20190131
MDC IDC SESS DTM: 20190814221059

## 2017-10-26 LAB — CUP PACEART REMOTE DEVICE CHECK
MDC IDC PG IMPLANT DT: 20190131
MDC IDC SESS DTM: 20190917003958

## 2017-11-17 ENCOUNTER — Ambulatory Visit (INDEPENDENT_AMBULATORY_CARE_PROVIDER_SITE_OTHER): Payer: Medicare HMO | Admitting: *Deleted

## 2017-11-17 DIAGNOSIS — I639 Cerebral infarction, unspecified: Secondary | ICD-10-CM

## 2017-11-17 NOTE — Progress Notes (Signed)
Carelink Summary Report / Loop Recorder 

## 2017-12-10 LAB — CUP PACEART REMOTE DEVICE CHECK
Implantable Pulse Generator Implant Date: 20190131
MDC IDC SESS DTM: 20191020013941

## 2017-12-11 ENCOUNTER — Encounter: Payer: Self-pay | Admitting: Cardiovascular Disease

## 2017-12-11 ENCOUNTER — Ambulatory Visit: Payer: Medicare HMO | Admitting: Cardiovascular Disease

## 2017-12-11 VITALS — BP 142/84 | HR 62 | Ht 66.0 in | Wt 169.0 lb

## 2017-12-11 DIAGNOSIS — E119 Type 2 diabetes mellitus without complications: Secondary | ICD-10-CM

## 2017-12-11 DIAGNOSIS — I5042 Chronic combined systolic (congestive) and diastolic (congestive) heart failure: Secondary | ICD-10-CM

## 2017-12-11 DIAGNOSIS — E7800 Pure hypercholesterolemia, unspecified: Secondary | ICD-10-CM | POA: Diagnosis not present

## 2017-12-11 DIAGNOSIS — I10 Essential (primary) hypertension: Secondary | ICD-10-CM | POA: Diagnosis not present

## 2017-12-11 DIAGNOSIS — I2510 Atherosclerotic heart disease of native coronary artery without angina pectoris: Secondary | ICD-10-CM | POA: Insufficient documentation

## 2017-12-11 DIAGNOSIS — I63433 Cerebral infarction due to embolism of bilateral posterior cerebral arteries: Secondary | ICD-10-CM

## 2017-12-11 MED ORDER — CARVEDILOL 6.25 MG PO TABS: 6.2500 mg | ORAL_TABLET | Freq: Two times a day (BID) | ORAL | 1 refills | Status: DC

## 2017-12-11 NOTE — Progress Notes (Signed)
Cardiology Office Note:    Date:  12/11/2017   ID:  Justin [name], DOB 1950/08/05, MRN [id number]  PCP:  Heinz Knuckles, MD  Cardiologist:  Sanda Klein, MD   Referring MD: Heinz Knuckles, MD   No chief complaint on file. Left ventricular dysfunction, recent stroke  History of Present Illness:    Justin [name] is a 67 y.o. male with ischemic cardiomyopathy (NYHA class 1-2, EF 40-45% echo May 2019), painless CAD (DES SIERRA 2.75 X 18 mm, March 2505), embolic stroke (January 3976; left basal ganglia, left parieto-occipital cortex and right cerebellar hemisphere).    In January 2019 , presented with CVA andhe underwent transesophageal echo which did not show embolic source and had a loop recorder placed.  However the echo evaluation showed evidence of moderate left ventricular systolic dysfunction with an ejection fraction of 35%, mild dilation of the left ventricular chamber and regional wall motion abnormalities with akinesis of the entire distribution of the right coronary artery and grade 2 diastolic dysfunction.  The echo abnormalities were new when compared to a study from 2010. Cath showed CAD and received a DES to proximal RCA March 2019, followed by improvement in LVEF to 40-45%).  I think he is doing much better.  Much more engaged in physically active.  He's been out fishing a lot.  Is been able to perform yard work without much limitations.  Feels that he is "out of shape" but otherwise feels well.  His balance is also much better.  He has noticed that he relies much more in his vision for balance, but he has not had any falls or really any challenges.  The patient specifically denies any chest pain at rest exertion, dyspnea at rest or with exertion, orthopnea, paroxysmal nocturnal dyspnea, syncope, palpitations, focal neurological deficits, intermittent claudication, lower extremity edema, unexplained weight gain, cough, hemoptysis or wheezing.   He bleeds more freely if he  injures himself, but has not had any serious injuries or bleeding problems.  So far, his loop recorder has not shown any evidence of atrial fibrillation.  Typical blood pressure at home is in the 135-145/70s range.  His usual heart rate in the morning before taking medications is around 77.  Past Medical History:  Diagnosis Date  . Abnormal echocardiogram 04/03/2017  . CAD S/P percutaneous coronary angioplasty 04/02/17 DES to RCA 04/02/2017  . Calcium oxalate renal stones   . Chewing tobacco use   . Gallstones    hx/o, s/p choleycystectomy  . H/O echocardiogram 10/2008   no wall thickness, EF 55%, mild mitral regurge  . Hyperlipidemia   . Hypertension    hx/o, noncompliance prior with medication  . Influenza vaccine refused 09/2013  . Rheumatic fever    age 28  . Stroke ([place]) 10/02/2008  . Tinnitus     Past Surgical History:  Procedure Laterality Date  . blood clot  1997   brain  . BRAIN SURGERY  1997   craniotomy, evacuation of hematoma  . CHOLECYSTECTOMY  09/06/2011   Procedure: LAPAROSCOPIC CHOLECYSTECTOMY WITH INTRAOPERATIVE CHOLANGIOGRAM;  Surgeon: Earnstine Regal, MD;  Location: [facility];  Service: General;  Laterality: N/A;  . COLONOSCOPY  01/04/14   normal. Dr. Owens Loffler  . CORONARY STENT INTERVENTION N/A 04/02/2017   Procedure: CORONARY STENT INTERVENTION;  Surgeon: Leonie Man, MD;  Location: [place] [facility];  Service: Cardiovascular;  Laterality: N/A;  . Dental implant    . INTRAVASCULAR PRESSURE WIRE/FFR STUDY N/A 04/02/2017  Procedure: INTRAVASCULAR PRESSURE WIRE/FFR STUDY;  Surgeon: Leonie Man, MD;  Location: [place] [facility];  Service: Cardiovascular;  Laterality: N/A;  . JOINT REPLACEMENT  2004   LEFT BICEP REPAIR, RTC repair  . LEFT HEART CATH AND CORONARY ANGIOGRAPHY N/A 04/02/2017   Procedure: LEFT HEART CATH AND CORONARY ANGIOGRAPHY;  Surgeon: Leonie Man, MD;  Location: [place] [facility];  Service: Cardiovascular;  Laterality: N/A;  . LOOP  RECORDER INSERTION N/A 02/27/2017   Procedure: LOOP RECORDER INSERTION;  Surgeon: Sanda Klein, MD;  Location: [place] [facility];  Service: Cardiovascular;  Laterality: N/A;  . LOOP RECORDER INSERTION N/A 02/27/2017   Procedure: LOOP RECORDER INSERTION;  Surgeon: Pixie Casino, MD;  Location: [place];  Service: Cardiovascular;  Laterality: N/A;  . ROTATOR CUFF REPAIR     Right  . SIGMOIDOSCOPY    . TEE WITHOUT CARDIOVERSION N/A 02/27/2017   Procedure: TRANSESOPHAGEAL ECHOCARDIOGRAM (TEE);  Surgeon: Pixie Casino, MD;  Location: [facility];  Service: Cardiovascular;  Laterality: N/A;    Current Medications: Current Meds  Medication Sig  . aspirin 81 MG chewable tablet Chew 1 tablet (81 mg total) by mouth daily.  Marland Kitchen atorvastatin (LIPITOR) 40 MG tablet Take 1 tablet (40 mg total) by mouth daily at 6 PM.  . carvedilol (COREG) 6.25 MG tablet Take 1 tablet (6.25 mg total) by mouth 2 (two) times daily.  . clopidogrel (PLAVIX) 75 MG tablet Take 1 tablet (75 mg total) by mouth daily.  . Multiple Vitamins-Minerals (PRESERVISION AREDS 2) CAPS Take 2 capsules by mouth daily.  . nitroGLYCERIN (NITROSTAT) 0.4 MG SL tablet Place 1 tablet (0.4 mg total) under the tongue every 5 (five) minutes as needed for chest pain.  . sacubitril-valsartan (ENTRESTO) 97-103 MG Take 1 tablet by mouth 2 (two) times daily.  . [DISCONTINUED] carvedilol (COREG) 3.125 MG tablet Take 1 tablet (3.125 mg total) by mouth 2 (two) times daily.     Allergies:   Patient has no known allergies.   Social History   Socioeconomic History  . Marital status: Single    Spouse name: Not on file  . Number of children: 2  . Years of education: Not on file  . Highest education level: Not on file  Occupational History    Employer: [employer]  . Financial resource strain: Not on file  . Food insecurity:    Worry: Not on file    Inability: Not on file  . Transportation needs:    Medical: Not on file     Non-medical: Not on file  Tobacco Use  . Smoking status: Never Smoker  . Smokeless tobacco: Current User    Types: Chew  Substance and Sexual Activity  . Alcohol use: No    Alcohol/week: 0.0 standard drinks  . Drug use: No  . Sexual activity: Not on file  Lifestyle  . Physical activity:    Days per week: Not on file    Minutes per session: Not on file  . Stress: Not on file  Relationships  . Social connections:    Talks on phone: Not on file    Gets together: Not on file    Attends religious service: Not on file    Active member of club or organization: Not on file    Attends meetings of clubs or organizations: Not on file    Relationship status: Not on file  Other Topics Concern  . Not on file  Social History Narrative  Single, has 2 sons, 72yo and 57yo, electrician, walks regularly, 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       Family History: The patient's family history includes Alcohol abuse in his father; Cirrhosis in his brother; Diabetes in his brother; Heart disease (age of onset: 75) in his mother; Migraines in his mother. There is no history of Colon cancer, Cancer, Stroke, Hypertension, or Hyperlipidemia.  ROS:   Please see the history of present illness.     All other systems reviewed and are negative.  EKGs/Labs/Other Studies Reviewed:    The following studies were reviewed today:   EKG:  EKG is not ordered today.  Shows sinus rhythm with a minor intraventricular conduction delay (QRS 112 ms), very subtle nonspecific ST segment changes in leads aVF and V6, otherwise normal tracing.  Normal QTC 444 ms  Recent Labs: 02/28/2017: ALT 12 04/03/2017: Hemoglobin 12.9; Platelets 157 09/02/2017: BUN 12; Creatinine, Ser 0.87; Potassium 4.3; Sodium 139  Recent Lipid Panel    Component Value Date/Time   CHOL 211 (H) 02/26/2017 0328   TRIG 153 (H) 02/26/2017 0328   HDL 41 02/26/2017 0328   CHOLHDL 5.1 02/26/2017 0328   VLDL 31 02/26/2017 0328   LDLCALC 139 (H)  02/26/2017 0328   Jun 16, 2017, [facility] Total cholesterol 109, triglycerides 190, HDL 40, LDL 31 March 17, 2017  hemoglobin A1c 5.7%  Physical Exam:    VS:  BP (!) 142/84   Pulse 62   Ht 5\' 6"  (1.676 m)   Wt 169 lb (76.7 kg)   BMI 27.28 kg/m     Wt Readings from Last 3 Encounters:  12/11/17 169 lb (76.7 kg)  08/14/17 166 lb 9.6 oz (75.6 kg)  07/24/17 167 lb 6.4 oz (75.9 kg)      General: Alert, oriented x3, no distress, minimally overweight Head: no evidence of trauma, PERRL, EOMI, no exophtalmos or lid lag, no myxedema, no xanthelasma; normal ears, nose and oropharynx Neck: normal jugular venous pulsations and no hepatojugular reflux; brisk carotid pulses without delay and no carotid bruits Chest: clear to auscultation, no signs of consolidation by percussion or palpation, normal fremitus, symmetrical and full respiratory excursions Cardiovascular: normal position and quality of the apical impulse, regular rhythm, normal first and second heart sounds, no murmurs, rubs or gallops Abdomen: no tenderness or distention, no masses by palpation, no abnormal pulsatility or arterial bruits, normal bowel sounds, no hepatosplenomegaly Extremities: no clubbing, cyanosis or edema; 2+ radial, ulnar and brachial pulses bilaterally; 2+ right femoral, posterior tibial and dorsalis pedis pulses; 2+ left femoral, posterior tibial and dorsalis pedis pulses; no subclavian or femoral bruits Neurological: grossly nonfocal Psych: Normal mood and affect    ASSESSMENT:    1. Chronic combined systolic and diastolic heart failure ([place])   2. Coronary artery disease involving native coronary artery of native heart without angina pectoris   3. Essential hypertension   4. Hypercholesterolemia   5. Diabetes mellitus type 2 in nonobese (HCC)   6. 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 CHF: Essentially asymptomatic, NYHA functional class I, euvolemic without loop diuretics.  Tolerating full dose of Entresto, will try to see if we can increase the dose of carvedilol back to 6.25 mg twice daily.  I suspect she will not be able to tolerate any higher doses of beta-blocker due to his heart rate. 2. CAD:  He has never had angina.  Remains asymptomatic.  There is evidence of some permanent injury in the distribution of the right coronary artery, but LVEF has improved following stent placement and optimization of heart failure medications. 3. HTN: Target blood pressure should definitely be less than 130/80, there is room to increase his carvedilol. 4. HLP: On statin with LDL earlier this year well within target range. 5. DM: Diet controlled.  He is planning to lose 10 pounds of weight or so. 6. Embolic CVA: pattern was strongly suggestive of embolic disease with involvement of multiple vascular territories.  He has made excellent neurological recovery.  So far we have not detected atrial fibrillation despite 6 months of loop recorder monitoring. 7. Depression:  He seems to have overcome the mood problems he was having shortly after his stroke and is more active and engaged in his favorite activities again.  Medication Adjustments/Labs and Tests Ordered: Current medicines are reviewed at length with the patient today.  Concerns regarding medicines are outlined above.  Orders Placed This Encounter  Procedures  . EKG 12-Lead   Meds ordered this encounter  Medications  . carvedilol (COREG) 6.25 MG tablet    Sig: Take 1 tablet (6.25 mg total) by mouth 2 (two) times daily.    Dispense:  180 tablet    Refill:  1    Signed, Sanda Klein, MD  12/11/2017 9:08 AM    [place]

## 2017-12-11 NOTE — Patient Instructions (Signed)
Medication Instructions:  Increase Carvedilol to 6.25 mg twice daily.  If you need a refill on your cardiac medications before your next appointment, please call your pharmacy.    Follow-Up: At [facility], you and your health needs are our priority.  As part of our continuing mission to provide you with exceptional heart care, we have created designated Provider Care Teams.  These Care Teams include your primary Cardiologist (physician) and Advanced Practice Providers (APPs -  Physician Assistants and Nurse Practitioners) who all work together to provide you with the care you need, when you need it. You will need a follow up appointment in 6 months.  Please call our office 2 months in advance to schedule this appointment.  You may see Sanda Klein, MD or one of the following Advanced Practice Providers on your designated Care Team: [place], [place] . Fabian Sharp, PA-C  Any Other Special Instructions Will Be Listed Below (If Applicable). none

## 2017-12-18 ENCOUNTER — Ambulatory Visit (INDEPENDENT_AMBULATORY_CARE_PROVIDER_SITE_OTHER): Payer: Medicare HMO

## 2017-12-18 DIAGNOSIS — I63433 Cerebral infarction due to embolism of bilateral posterior cerebral arteries: Secondary | ICD-10-CM

## 2017-12-19 NOTE — Progress Notes (Signed)
Carelink Summary Report / Loop Recorder 

## 2018-01-20 ENCOUNTER — Ambulatory Visit (INDEPENDENT_AMBULATORY_CARE_PROVIDER_SITE_OTHER): Payer: Medicare HMO

## 2018-01-20 DIAGNOSIS — I63433 Cerebral infarction due to embolism of bilateral posterior cerebral arteries: Secondary | ICD-10-CM

## 2018-01-21 LAB — CUP PACEART REMOTE DEVICE CHECK
Date Time Interrogation Session: 20191225031015
Implantable Pulse Generator Implant Date: 20190131

## 2018-01-22 NOTE — Progress Notes (Signed)
Carelink Summary Report / Loop Recorder 

## 2018-02-01 LAB — CUP PACEART REMOTE DEVICE CHECK
Date Time Interrogation Session: 20191122023954
MDC IDC PG IMPLANT DT: 20190131

## 2018-02-08 ENCOUNTER — Other Ambulatory Visit: Payer: Self-pay | Admitting: Cardiovascular Disease

## 2018-02-16 ENCOUNTER — Telehealth: Payer: Self-pay | Admitting: Cardiovascular Disease

## 2018-02-16 MED ORDER — ATORVASTATIN CALCIUM 40 MG PO TABS: 40.0000 mg | ORAL_TABLET | Freq: Every day | ORAL | 3 refills | Status: DC

## 2018-02-16 MED ORDER — CLOPIDOGREL BISULFATE 75 MG PO TABS: 75.0000 mg | ORAL_TABLET | Freq: Every day | ORAL | 3 refills | Status: DC

## 2018-02-16 MED ORDER — CARVEDILOL 6.25 MG PO TABS: 6.2500 mg | ORAL_TABLET | Freq: Two times a day (BID) | ORAL | 3 refills | Status: DC

## 2018-02-16 MED ORDER — SACUBITRIL-VALSARTAN 97-103 MG PO TABS: 1.0000 | ORAL_TABLET | Freq: Two times a day (BID) | ORAL | 3 refills | Status: DC

## 2018-02-16 NOTE — Telephone Encounter (Signed)
New Message   Pt c/o medication issue:  1. Name of Medication: carvedilol (COREG) 3.125 MG tablet    2. How are you currently taking this medication (dosage and times per day)? TAKE 1 TABLET TWICE A DAY WITH MEALS  3. Are you having a reaction (difficulty breathing--STAT)?  4. What is your medication issue? Patient is calling to inquiry about the change in his medication. He said he was taking 6.25 and now its 3.125 he is not sure why the change occurred. Please call.

## 2018-02-16 NOTE — Telephone Encounter (Signed)
Returned call to patient he stated coreg was increased to 6.25 mg twice a day.Stated when he went to pick up prescription 3.125 mg tablet was sent in.Advised I will send in 90 day coreg 6.25 mg.He also requested 90 day refills on other medications.90 day refills sent in to pharmacy.

## 2018-02-23 ENCOUNTER — Ambulatory Visit (INDEPENDENT_AMBULATORY_CARE_PROVIDER_SITE_OTHER): Payer: Medicare HMO

## 2018-02-23 DIAGNOSIS — I5042 Chronic combined systolic (congestive) and diastolic (congestive) heart failure: Secondary | ICD-10-CM

## 2018-02-23 DIAGNOSIS — I63433 Cerebral infarction due to embolism of bilateral posterior cerebral arteries: Secondary | ICD-10-CM

## 2018-02-24 LAB — CUP PACEART REMOTE DEVICE CHECK
Implantable Pulse Generator Implant Date: 20190131
MDC IDC SESS DTM: 20200127030841

## 2018-02-24 NOTE — Progress Notes (Signed)
Carelink Summary Report / Loop Recorder 

## 2018-03-10 IMAGING — CT CT HEAD W/O CM
4 series · 16 of 47 positions shown, 18 images · non-contrast
Comparison: None.

CLINICAL DATA: Blurred vision.  Unsteady gait.

EXAM:
CT HEAD WITHOUT CONTRAST
TECHNIQUE: Contiguous axial images were obtained from the base of the skull
through the vertex without intravenous contrast.

[Series 3: head without · axial · non-contrast · 0.46mm/px · z∈[-119,-9]mm · 7 of 30 slices shown, 9 images]
[im 4/30  brain]
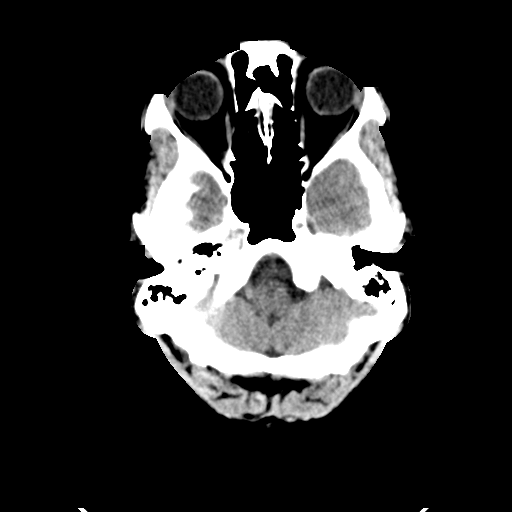
[im 4/30  bone]
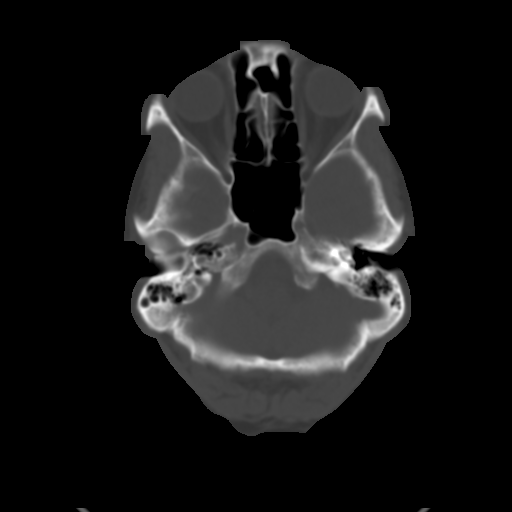
[im 8/30  brain]
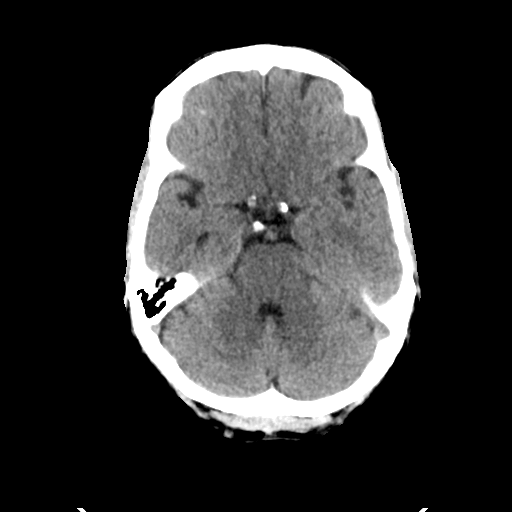
[im 11/30  brain]
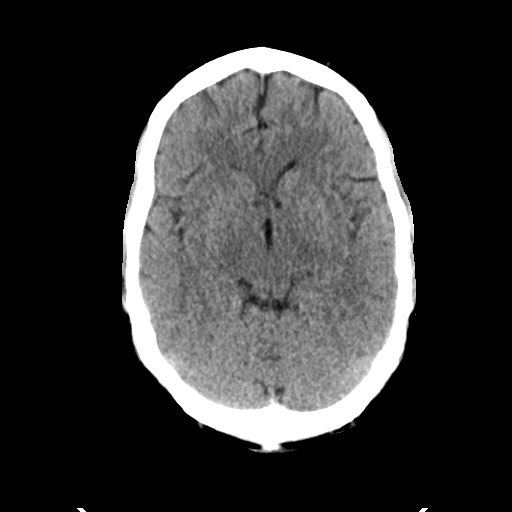
[im 15/30  brain]
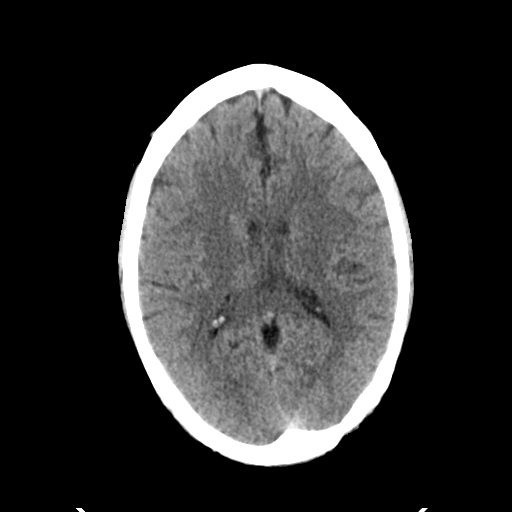
[im 19/30  brain]
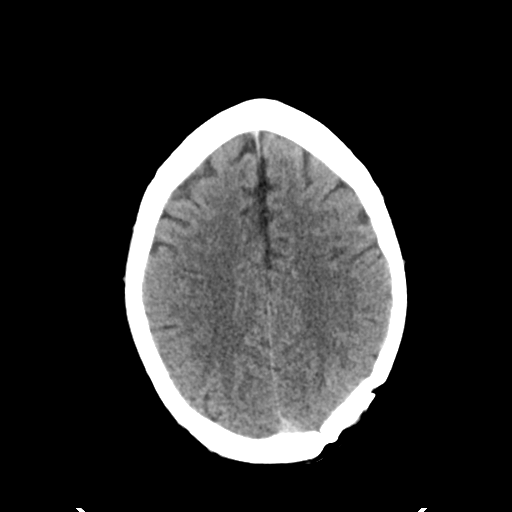
[im 19/30  bone]
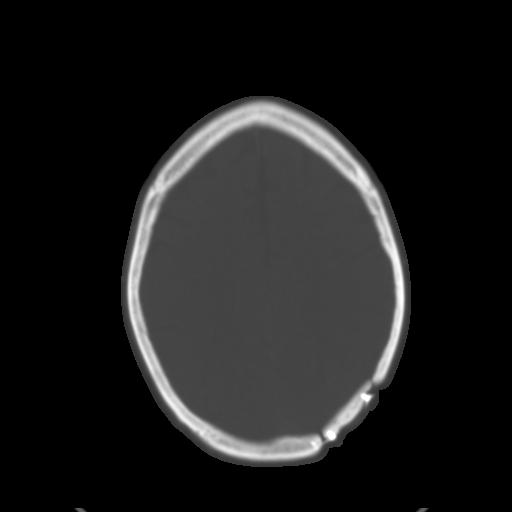
[im 22/30  brain]
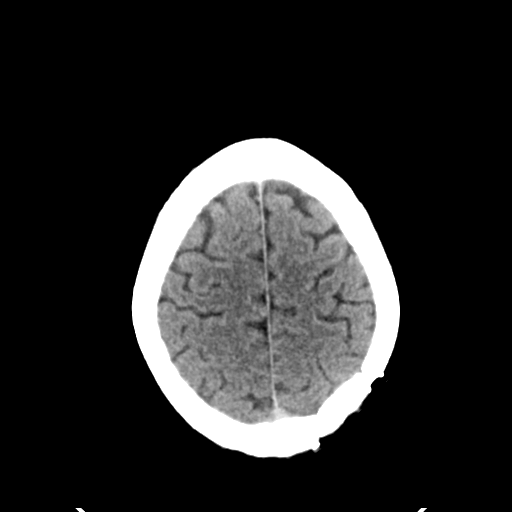
[im 26/30  brain]
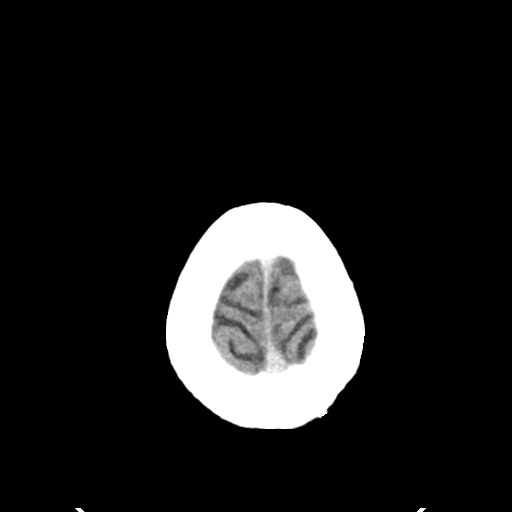

[Series 4: head bone · axial · 0.46mm/px · z∈[-120,-90]mm · 3 of 75 slices shown]
[im 8/75  bone]
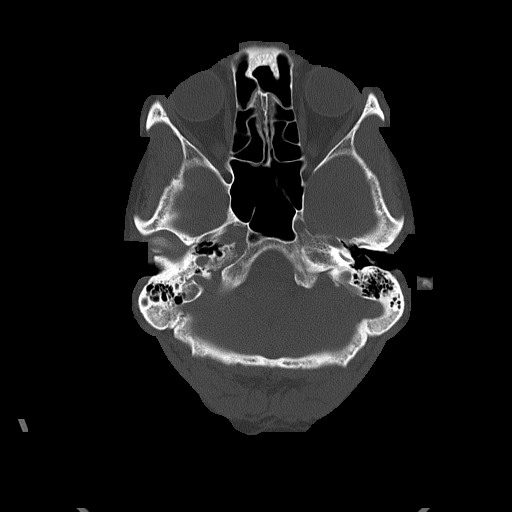
[im 15/75  bone]
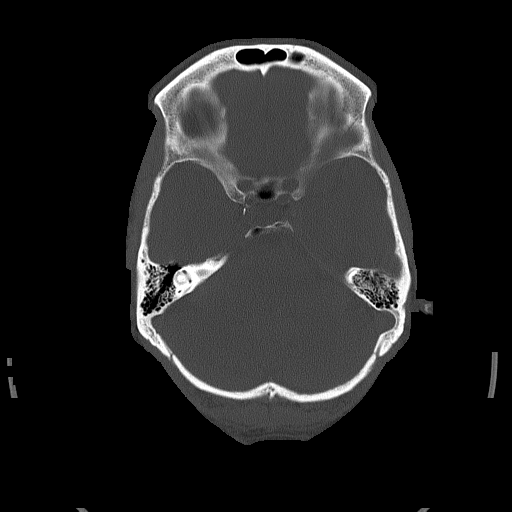
[im 23/75  bone]
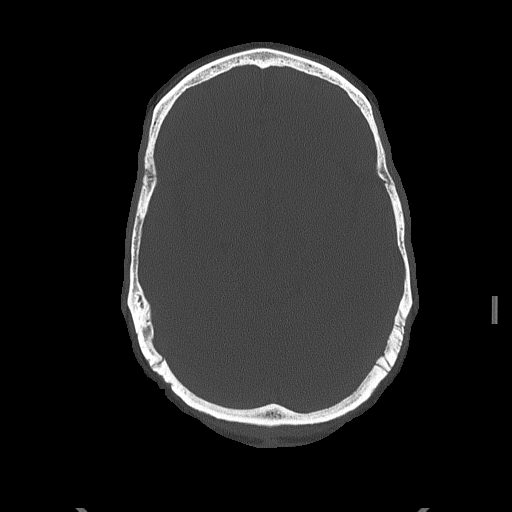

[Series 5: head without cor · coronal · non-contrast · 0.30mm/px · 3 of 67 slices shown]
[im 23/67  brain]
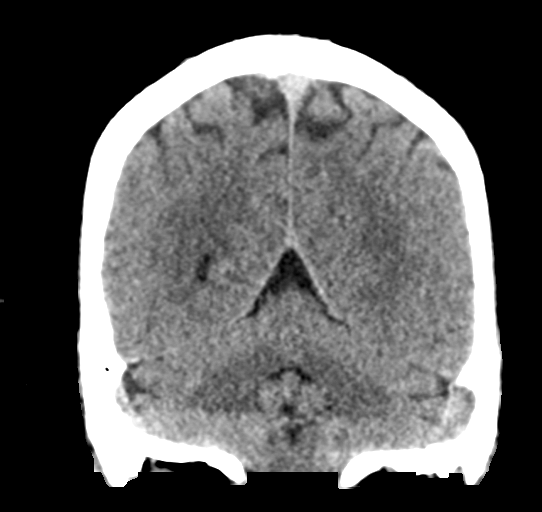
[im 30/67  brain]
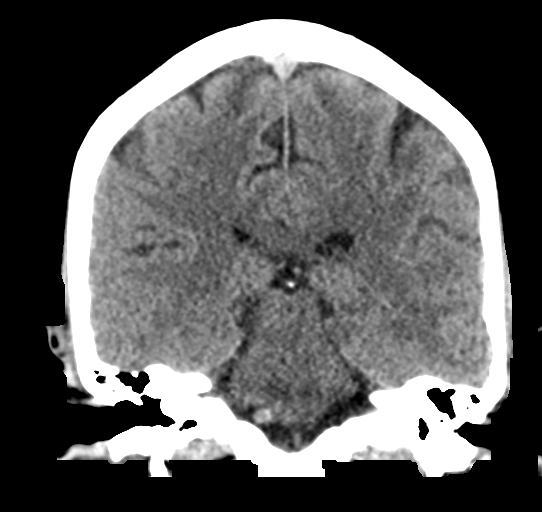
[im 37/67  brain]
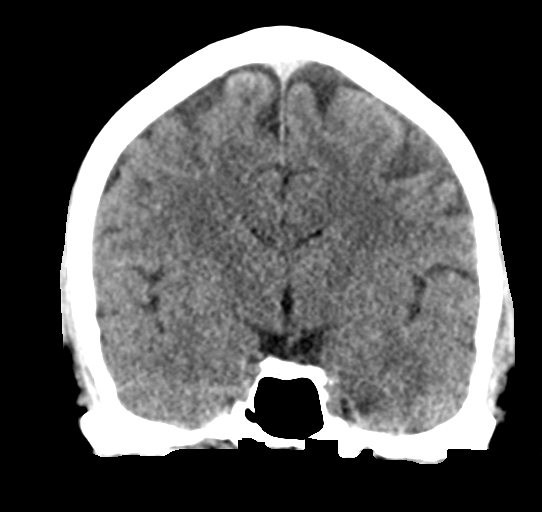

[Series 6: head without sag · sagittal · non-contrast · 0.29mm/px · 3 of 55 slices shown]
[im 19/55  brain]
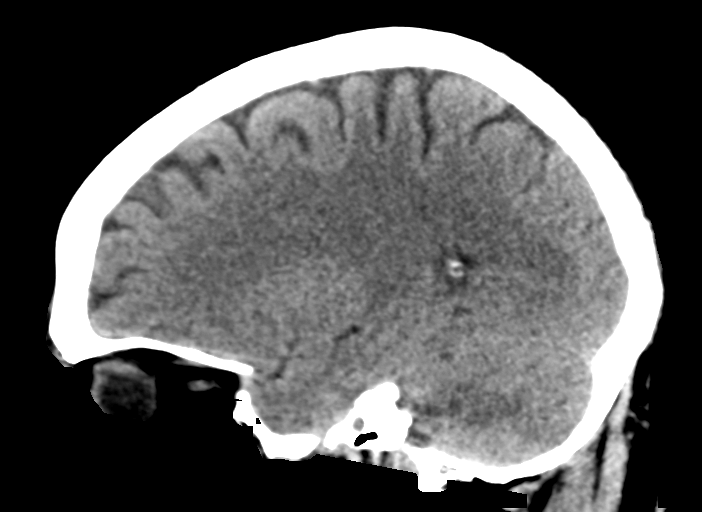
[im 28/55  brain]
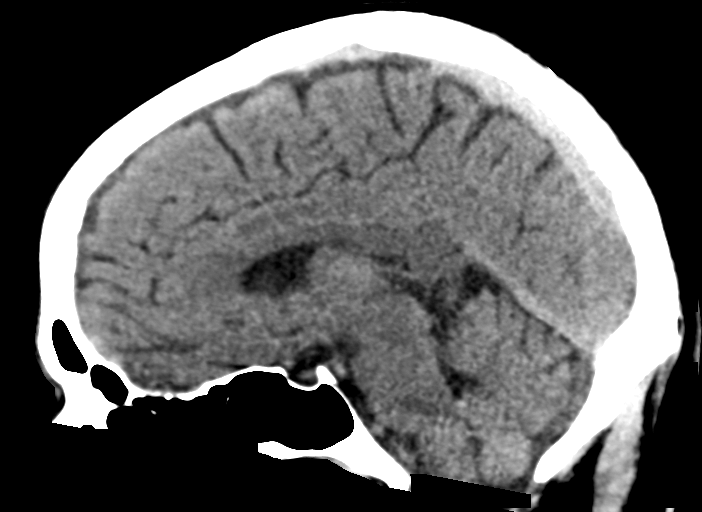
[im 37/55  brain]
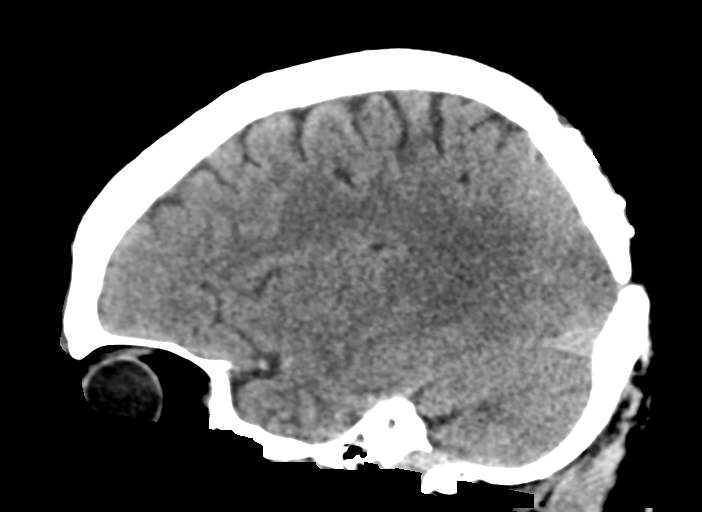

[16 of 47 positions shown; findings below may reference images not displayed]

FINDINGS: Brain: No acute intracranial abnormality. Specifically, no
hemorrhage, hydrocephalus, mass lesion, acute infarction, or
significant intracranial injury.

Vascular: No hyperdense vessel or unexpected calcification.

Skull: Prior craniotomy in the left posterior parietal region. No
acute calvarial abnormality.

Sinuses/Orbits: Visualized paranasal sinuses and mastoids clear.
Orbital soft tissues unremarkable.

Other: None
IMPRESSION: No acute intracranial abnormality.

## 2018-03-30 ENCOUNTER — Ambulatory Visit (INDEPENDENT_AMBULATORY_CARE_PROVIDER_SITE_OTHER): Payer: Medicare HMO | Admitting: *Deleted

## 2018-03-30 DIAGNOSIS — I63433 Cerebral infarction due to embolism of bilateral posterior cerebral arteries: Secondary | ICD-10-CM

## 2018-03-30 DIAGNOSIS — I5042 Chronic combined systolic (congestive) and diastolic (congestive) heart failure: Secondary | ICD-10-CM

## 2018-04-02 LAB — CUP PACEART REMOTE DEVICE CHECK
Date Time Interrogation Session: 20200229030611
MDC IDC PG IMPLANT DT: 20190131

## 2018-04-06 NOTE — Progress Notes (Signed)
Carelink Summary Report / Loop Recorder 

## 2018-04-29 ENCOUNTER — Telehealth: Payer: Self-pay | Admitting: Cardiovascular Disease

## 2018-04-29 NOTE — Telephone Encounter (Signed)
New Message:     Pt says he has a torn ligament in his elbow. He said the doctor h Gave him Diclofenac Sodium Topical Gel. He wants to know if he should use this? He said the warnings said somebody a heart attack or stroke.

## 2018-04-29 NOTE — Telephone Encounter (Signed)
Returned call to patient.  Explained that the topical diclofenac is fine to use.  Patient voiced understanding.

## 2018-04-30 ENCOUNTER — Other Ambulatory Visit: Payer: Self-pay

## 2018-04-30 ENCOUNTER — Ambulatory Visit (INDEPENDENT_AMBULATORY_CARE_PROVIDER_SITE_OTHER): Payer: Medicare HMO | Admitting: *Deleted

## 2018-04-30 DIAGNOSIS — I63433 Cerebral infarction due to embolism of bilateral posterior cerebral arteries: Secondary | ICD-10-CM

## 2018-04-30 LAB — CUP PACEART REMOTE DEVICE CHECK
Date Time Interrogation Session: 20200402074158
Implantable Pulse Generator Implant Date: 20190131

## 2018-05-07 NOTE — Progress Notes (Signed)
Carelink Summary Report / Loop Recorder 

## 2018-05-25 ENCOUNTER — Other Ambulatory Visit: Payer: Self-pay

## 2018-05-26 ENCOUNTER — Telehealth: Payer: Self-pay | Admitting: *Deleted

## 2018-05-26 NOTE — Telephone Encounter (Signed)
Submitted PA through cover my meds for Entresto. Message came back "PA already on file."

## 2018-05-27 ENCOUNTER — Telehealth: Payer: Self-pay | Admitting: *Deleted

## 2018-05-27 NOTE — Telephone Encounter (Signed)
LMOM @ 1:28 pm, re: follow up appointment.

## 2018-06-02 ENCOUNTER — Ambulatory Visit (INDEPENDENT_AMBULATORY_CARE_PROVIDER_SITE_OTHER): Payer: Medicare HMO | Admitting: *Deleted

## 2018-06-02 ENCOUNTER — Other Ambulatory Visit: Payer: Self-pay

## 2018-06-02 DIAGNOSIS — I63433 Cerebral infarction due to embolism of bilateral posterior cerebral arteries: Secondary | ICD-10-CM | POA: Diagnosis not present

## 2018-06-02 LAB — CUP PACEART REMOTE DEVICE CHECK
Date Time Interrogation Session: 20200505093332
Implantable Pulse Generator Implant Date: 20190131

## 2018-06-08 NOTE — Progress Notes (Signed)
Carelink Summary Report / Loop Recorder 

## 2018-07-06 ENCOUNTER — Ambulatory Visit (INDEPENDENT_AMBULATORY_CARE_PROVIDER_SITE_OTHER): Payer: Medicare HMO | Admitting: *Deleted

## 2018-07-06 DIAGNOSIS — I63433 Cerebral infarction due to embolism of bilateral posterior cerebral arteries: Secondary | ICD-10-CM

## 2018-07-06 DIAGNOSIS — I5042 Chronic combined systolic (congestive) and diastolic (congestive) heart failure: Secondary | ICD-10-CM

## 2018-07-06 LAB — CUP PACEART REMOTE DEVICE CHECK
Date Time Interrogation Session: 20200607124133
Implantable Pulse Generator Implant Date: 20190131

## 2018-07-13 NOTE — Progress Notes (Signed)
Carelink Summary Report / Loop Recorder 

## 2018-08-07 ENCOUNTER — Ambulatory Visit (INDEPENDENT_AMBULATORY_CARE_PROVIDER_SITE_OTHER): Payer: Medicare HMO | Admitting: *Deleted

## 2018-08-07 DIAGNOSIS — I639 Cerebral infarction, unspecified: Secondary | ICD-10-CM | POA: Diagnosis not present

## 2018-08-07 LAB — CUP PACEART REMOTE DEVICE CHECK
Date Time Interrogation Session: 20200710144149
Implantable Pulse Generator Implant Date: 20190131

## 2018-08-11 NOTE — Progress Notes (Signed)
Carelink Summary Report / Loop Recorder 

## 2018-09-03 ENCOUNTER — Telehealth: Payer: Self-pay | Admitting: Cardiovascular Disease

## 2018-09-03 NOTE — Telephone Encounter (Signed)
New message  Pt c/o medication issue:  1. Name of Medication: sacubitril-valsartan (ENTRESTO) 97-103 MG  2. How are you currently taking this medication (dosage and times per day)? Twice daily  3. Are you having a reaction (difficulty breathing--STAT)? No   4. What is your medication issue? Patient states that medication is $429.00 out of pocket for a $90.00 day supply. Please call to discuss options.

## 2018-09-03 NOTE — Telephone Encounter (Signed)
Call returned to the patient. He stated that he would rather not file for assistance because he will get denied and does not want to try.   He would like to know if there is anything else that he could try.

## 2018-09-03 NOTE — Telephone Encounter (Signed)
Switch to plain valsartan 320 mg daily - not as good of a drug, but should be very inexpensive.

## 2018-09-03 NOTE — Telephone Encounter (Signed)
Any suggestions on how to assist patient with Justin [name]?

## 2018-09-03 NOTE — Telephone Encounter (Signed)
Left a message for the patient to call back.  

## 2018-09-03 NOTE — Telephone Encounter (Signed)
Please provide 2 weeks sample and complete paperwork for patient assistance.

## 2018-09-04 NOTE — Telephone Encounter (Signed)
Follow up: ° ° °Patient returning call  ° ° ° °

## 2018-09-04 NOTE — Telephone Encounter (Signed)
Spoke with patient. Explained the income criteria for [employer] Patient Assistance. He thinks he may qualify. Will have MD complete provider portion and mail to him to be completed. He is hesitant to change medications as he is feeling good and his BP is good 118-120/70s.

## 2018-09-09 ENCOUNTER — Ambulatory Visit (INDEPENDENT_AMBULATORY_CARE_PROVIDER_SITE_OTHER): Payer: Medicare HMO | Admitting: *Deleted

## 2018-09-09 DIAGNOSIS — I639 Cerebral infarction, unspecified: Secondary | ICD-10-CM

## 2018-09-09 NOTE — Telephone Encounter (Signed)
Assistance application has been mailed to the patient for him to fill out his section and mail.

## 2018-09-10 LAB — CUP PACEART REMOTE DEVICE CHECK
Date Time Interrogation Session: 20200812153828
Implantable Pulse Generator Implant Date: 20190131

## 2018-09-17 NOTE — Progress Notes (Signed)
Carelink Summary Report / Loop Recorder 

## 2018-10-12 ENCOUNTER — Ambulatory Visit (INDEPENDENT_AMBULATORY_CARE_PROVIDER_SITE_OTHER): Payer: Medicare HMO | Admitting: *Deleted

## 2018-10-12 DIAGNOSIS — I639 Cerebral infarction, unspecified: Secondary | ICD-10-CM | POA: Diagnosis not present

## 2018-10-12 DIAGNOSIS — I420 Dilated cardiomyopathy: Secondary | ICD-10-CM

## 2018-10-12 LAB — CUP PACEART REMOTE DEVICE CHECK
Date Time Interrogation Session: 20200914154138
Implantable Pulse Generator Implant Date: 20190131

## 2018-10-23 NOTE — Progress Notes (Signed)
Carelink Summary Report / Loop Recorder 

## 2018-11-16 ENCOUNTER — Ambulatory Visit (INDEPENDENT_AMBULATORY_CARE_PROVIDER_SITE_OTHER): Payer: Medicare HMO | Admitting: *Deleted

## 2018-11-16 DIAGNOSIS — I255 Ischemic cardiomyopathy: Secondary | ICD-10-CM | POA: Diagnosis not present

## 2018-11-16 LAB — CUP PACEART REMOTE DEVICE CHECK
Date Time Interrogation Session: 20201017153957
Implantable Pulse Generator Implant Date: 20190131

## 2018-11-24 ENCOUNTER — Telehealth: Payer: Self-pay | Admitting: *Deleted

## 2018-11-24 NOTE — Telephone Encounter (Signed)
Left a message for the patient to call back to confirm his virtual appointment tomorrow with Dr. [name] and to see if he would be able to provide a current weight, blood pressure and heart rate.

## 2018-11-25 ENCOUNTER — Ambulatory Visit (INDEPENDENT_AMBULATORY_CARE_PROVIDER_SITE_OTHER): Payer: Medicare HMO | Admitting: Cardiovascular Disease

## 2018-11-25 ENCOUNTER — Encounter: Payer: Self-pay | Admitting: Cardiovascular Disease

## 2018-11-25 ENCOUNTER — Other Ambulatory Visit (INDEPENDENT_AMBULATORY_CARE_PROVIDER_SITE_OTHER): Payer: Medicare HMO

## 2018-11-25 VITALS — BP 144/69 | HR 65 | Ht 66.0 in | Wt 168.2 lb

## 2018-11-25 DIAGNOSIS — Z8673 Personal history of transient ischemic attack (TIA), and cerebral infarction without residual deficits: Secondary | ICD-10-CM

## 2018-11-25 DIAGNOSIS — I2510 Atherosclerotic heart disease of native coronary artery without angina pectoris: Secondary | ICD-10-CM

## 2018-11-25 DIAGNOSIS — E7800 Pure hypercholesterolemia, unspecified: Secondary | ICD-10-CM

## 2018-11-25 DIAGNOSIS — I5042 Chronic combined systolic (congestive) and diastolic (congestive) heart failure: Secondary | ICD-10-CM

## 2018-11-25 DIAGNOSIS — I63433 Cerebral infarction due to embolism of bilateral posterior cerebral arteries: Secondary | ICD-10-CM

## 2018-11-25 DIAGNOSIS — I10 Essential (primary) hypertension: Secondary | ICD-10-CM

## 2018-11-25 DIAGNOSIS — I420 Dilated cardiomyopathy: Secondary | ICD-10-CM

## 2018-11-25 DIAGNOSIS — I639 Cerebral infarction, unspecified: Secondary | ICD-10-CM

## 2018-11-25 DIAGNOSIS — E119 Type 2 diabetes mellitus without complications: Secondary | ICD-10-CM

## 2018-11-25 NOTE — Patient Instructions (Signed)
Medication Instructions:  No changes *If you need a refill on your cardiac medications before your next appointment, please call your pharmacy*  Lab Work: None ordered If you have labs (blood work) drawn today and your tests are completely normal, you will receive your results only by: . MyChart Message (if you have MyChart) OR . A paper copy in the mail If you have any lab test that is abnormal or we need to change your treatment, we will call you to review the results.  Testing/Procedures: None ordered  Follow-Up: At [facility], you and your health needs are our priority.  As part of our continuing mission to provide you with exceptional heart care, we have created designated Provider Care Teams.  These Care Teams include your primary Cardiologist (physician) and Advanced Practice Providers (APPs -  Physician Assistants and Nurse Practitioners) who all work together to provide you with the care you need, when you need it.  Your next appointment:   6 month(s)  The format for your next appointment:   In Person  Provider:   Mihai Croitoru, MD    

## 2018-11-25 NOTE — Progress Notes (Signed)
Cardiology Office Note:    Date:  11/25/2018   ID:  Justin [name], DOB Sep 04, 1950, MRN [id number]  PCP:  Heinz Knuckles, MD  Cardiologist:  Sanda Klein, MD   Referring MD: Heinz Knuckles, MD   Chief Complaint  Patient presents with  . Congestive Heart Failure  . Coronary Artery Disease  . Cerebrovascular Accident    history of    History of Present Illness:    Justin [name] is a 68 y.o. 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    The patient specifically denies any chest pain at rest exertion, dyspnea at rest or with exertion, orthopnea, paroxysmal nocturnal dyspnea, syncope, palpitations, focal neurological deficits, intermittent claudication, lower extremity edema, unexplained weight gain, cough, hemoptysis or wheezing.  He generally feels well and has very good functional status.  He enjoys fishing.  He is considering becoming sexually active with his girlfriend and complains of erectile dysfunction.  The cost of his Delene Loll has gone up substantially since he has entered the "donut hole" with Medicare part D.  His income is too high to qualify for the patient assistance program.  In January 2019 , presented with CVA and he underwent transesophageal echo which did not show embolic source and had a loop recorder placed.  However the echo evaluation showed evidence of moderate left ventricular systolic dysfunction with an ejection fraction of 35%, mild dilation of the left ventricular chamber and regional wall motion abnormalities with akinesis of the entire distribution of the right coronary artery and grade 2 diastolic dysfunction.  The echo abnormalities were new when compared to a study from 2010. Cath showed CAD and received a DES to proximal RCA March 2019, followed by  improvement in LVEF to 40-45%).  So far, his loop recorder has not shown any evidence of atrial fibrillation.  His blood pressure is a little high today, but his typical blood pressure at home (he showed me his log) is 110-120/60-70s.  He occasionally has dizziness and staggers a little bit, but he has not had any episodes of syncope or falls.  Past Medical History:  Diagnosis Date  . Abnormal echocardiogram 04/03/2017  . CAD S/P percutaneous coronary angioplasty 04/02/17 DES to RCA 04/02/2017  . Calcium oxalate renal stones   . Chewing tobacco use   . Gallstones    hx/o, s/p choleycystectomy  . H/O echocardiogram 10/2008   no wall thickness, EF 55%, mild mitral regurge  . Hyperlipidemia   . Hypertension    hx/o, noncompliance prior with medication  . Influenza vaccine refused 09/2013  . Rheumatic fever    age 23  . Stroke ([place]) 10/02/2008  . Tinnitus     Past Surgical History:  Procedure Laterality Date  . blood clot  1997   brain  . BRAIN SURGERY  1997   craniotomy, evacuation of hematoma  . CHOLECYSTECTOMY  09/06/2011   Procedure: LAPAROSCOPIC CHOLECYSTECTOMY WITH INTRAOPERATIVE CHOLANGIOGRAM;  Surgeon: Earnstine Regal, MD;  Location: [facility];  Service: General;  Laterality: N/A;  . COLONOSCOPY  01/04/14   normal. Dr. Owens Loffler  . CORONARY STENT INTERVENTION N/A 04/02/2017   Procedure: CORONARY STENT INTERVENTION;  Surgeon: Leonie Man, MD;  Location: [facility];  Service: Cardiovascular;  Laterality: N/A;  . Dental implant    . INTRAVASCULAR PRESSURE WIRE/FFR STUDY  N/A 04/02/2017   Procedure: INTRAVASCULAR PRESSURE WIRE/FFR STUDY;  Surgeon: Leonie Man, MD;  Location: [facility];  Service: Cardiovascular;  Laterality: N/A;  . JOINT REPLACEMENT  2004   LEFT BICEP REPAIR, RTC repair  . LEFT HEART CATH AND CORONARY ANGIOGRAPHY N/A 04/02/2017   Procedure: LEFT HEART CATH AND CORONARY ANGIOGRAPHY;  Surgeon: Leonie Man, MD;  Location: [facility];   Service: Cardiovascular;  Laterality: N/A;  . LOOP RECORDER INSERTION N/A 02/27/2017   Procedure: LOOP RECORDER INSERTION;  Surgeon: Sanda Klein, MD;  Location: [facility];  Service: Cardiovascular;  Laterality: N/A;  . LOOP RECORDER INSERTION N/A 02/27/2017   Procedure: LOOP RECORDER INSERTION;  Surgeon: Pixie Casino, MD;  Location: [facility];  Service: Cardiovascular;  Laterality: N/A;  . ROTATOR CUFF REPAIR     Right  . SIGMOIDOSCOPY    . TEE WITHOUT CARDIOVERSION N/A 02/27/2017   Procedure: TRANSESOPHAGEAL ECHOCARDIOGRAM (TEE);  Surgeon: Pixie Casino, MD;  Location: [facility];  Service: Cardiovascular;  Laterality: N/A;    Current Medications: Current Meds  Medication Sig  . aspirin 81 MG chewable tablet Chew 1 tablet (81 mg total) by mouth daily.  Marland Kitchen atorvastatin (LIPITOR) 40 MG tablet Take 1 tablet (40 mg total) by mouth daily at 6 PM.  . clopidogrel (PLAVIX) 75 MG tablet Take 1 tablet (75 mg total) by mouth daily.  . Multiple Vitamins-Minerals (PRESERVISION AREDS 2) CAPS Take 2 capsules by mouth daily.  . nitroGLYCERIN (NITROSTAT) 0.4 MG SL tablet Place 1 tablet (0.4 mg total) under the tongue every 5 (five) minutes as needed for chest pain.  . sacubitril-valsartan (ENTRESTO) 97-103 MG Take 1 tablet by mouth 2 (two) times daily.     Allergies:   Patient has no known allergies.   Social History   Socioeconomic History  . Marital status: Single    Spouse name: Not on file  . Number of children: 2  . Years of education: Not on file  . Highest education level: Not on file  Occupational History    Employer: [employer]  . Financial resource strain: Not on file  . Food insecurity    Worry: Not on file    Inability: Not on file  . Transportation needs    Medical: Not on file    Non-medical: Not on file  Tobacco Use  . Smoking status: Never Smoker  . Smokeless tobacco: Current User    Types: Chew  Substance and Sexual Activity   . Alcohol use: No    Alcohol/week: 0.0 standard drinks  . Drug use: No  . Sexual activity: Not on file  Lifestyle  . Physical activity    Days per week: Not on file    Minutes per session: Not on file  . Stress: Not on file  Relationships  . Social Herbalist on phone: Not on file    Gets together: Not on file    Attends religious service: Not on file    Active member of club or organization: Not on file    Attends meetings of clubs or organizations: Not on file    Relationship status: Not on file  Other Topics Concern  . Not on file  Social History Narrative   Single, has 2 sons, 22yo and 5yo, electrician, walks regularly, 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       Family History: The patient's family history includes Alcohol abuse in  his father; Cirrhosis in his brother; Diabetes in his brother; Heart disease (age of onset: 32) in his mother; Migraines in his mother. There is no history of Colon cancer, Cancer, Stroke, Hypertension, or Hyperlipidemia.  ROS:   Please see the history of present illness.    All other systems are reviewed and are negative.   EKGs/Labs/Other Studies Reviewed:    The following studies were reviewed today:   EKG:  EKG is not ordered today.  Tracing from 12/11/2017 shows sinus rhythm with nonspecific intraventricular conduction delay and minor ST segment nonspecific changes.  Recent Labs: No results found for requested labs within last 8760 hours.   Recent Lipid Panel    Component Value Date/Time   CHOL 211 (H) 02/26/2017 0328   TRIG 153 (H) 02/26/2017 0328   HDL 41 02/26/2017 0328   CHOLHDL 5.1 02/26/2017 0328   VLDL 31 02/26/2017 0328   LDLCALC 139 (H) 02/26/2017 0328   Jun 16, 2017, [facility] Total cholesterol 109, triglycerides 190, HDL 40, LDL 31 March 17, 2017  hemoglobin A1c 5.7%    Physical Exam:    VS:  BP (!) 144/69   Pulse 65   Ht 5\' 6"  (1.676 m)   Wt 168 lb 3.2 oz (76.3 kg)   BMI 27.15 kg/m      Wt Readings from Last 3 Encounters:  11/25/18 168 lb 3.2 oz (76.3 kg)  12/11/17 169 lb (76.7 kg)  08/14/17 166 lb 9.6 oz (75.6 kg)   General: Alert, oriented x3, no distress, healthy loop recorder site left parasternal Head: no evidence of trauma, PERRL, EOMI, no exophtalmos or lid lag, no myxedema, no xanthelasma; normal ears, nose and oropharynx Neck: normal jugular venous pulsations and no hepatojugular reflux; brisk carotid pulses without delay and no carotid bruits Chest: clear to auscultation, no signs of consolidation by percussion or palpation, normal fremitus, symmetrical and full respiratory excursions Cardiovascular: normal position and quality of the apical impulse, regular rhythm, normal first and second heart sounds, no murmurs, rubs or gallops Abdomen: no tenderness or distention, no masses by palpation, no abnormal pulsatility or arterial bruits, normal bowel sounds, no hepatosplenomegaly Extremities: no clubbing, cyanosis or edema; 2+ radial, ulnar and brachial pulses bilaterally; 2+ right femoral, posterior tibial and dorsalis pedis pulses; 2+ left femoral, posterior tibial and dorsalis pedis pulses; no subclavian or femoral bruits Neurological: grossly nonfocal Psych: Normal mood and affect   ASSESSMENT:    1. Chronic combined systolic and diastolic heart failure ([place])   2. Coronary artery disease involving native coronary artery of native heart without angina pectoris   3. Essential hypertension   4. Hypercholesterolemia   5. Diabetes mellitus type 2 in nonobese (HCC)   6. History of embolic stroke    PLAN:    In order of problems listed above:  1. CHF: He is doing quite well and is staying euvolemic without the use of loop diuretics on carvedilol and Entresto.  EF 40-45%.  Occasionally dizzy.  There is no room to increase his beta-blocker.  Briefly discussed the cost/benefit of Entresto.  Offered the option to switch to an ARB may be with added Aldactone.  He  has decided to stay on Entresto. 2. CAD: He does not have angina and has never had an anginal event.  Coronary disease was discovered incidentally during work-up for cardiomyopathy.  Remains asymptomatic.  There is evidence of some permanent injury in the distribution of the right coronary artery, but LVEF has improved following stent placement  and optimization of heart failure medications. 3. HTN: Although his systolic blood pressure is a little high in the office today, at home it is consistently on the low end and he has some symptoms of orthostatic hypotension.  No change in medications. 4. HLP: On atorvastatin.  Plan is labs with PCP. 5. DM: Diet controlled.  He is minimally overweight. 6. Embolic CVA: pattern was strongly suggestive of embolic disease with involvement of multiple vascular territories.  He has made excellent neurological recovery.  So far we have not detected atrial fibrillation almost 2 years of loop recorder monitoring.  Medication Adjustments/Labs and Tests Ordered: Current medicines are reviewed at length with the patient today.  Concerns regarding medicines are outlined above.  Orders Placed This Encounter  Procedures  . EKG 12-Lead   No orders of the defined types were placed in this encounter.  Patient Instructions  Medication Instructions:  No changes  *If you need a refill on your cardiac medications before your next appointment, please call your pharmacy*  Lab Work: None ordered If you have labs (blood work) drawn today and your tests are completely normal, you will receive your results only by: Marland Kitchen MyChart Message (if you have MyChart) OR . A paper copy in the mail If you have any lab test that is abnormal or we need to change your treatment, we will call you to review the results.  Testing/Procedures: None ordered  Follow-Up: At [facility], you and your health needs are our priority.  As part of our continuing mission to provide you with exceptional  heart care, we have created designated Provider Care Teams.  These Care Teams include your primary Cardiologist (physician) and Advanced Practice Providers (APPs -  Physician Assistants and Nurse Practitioners) who all work together to provide you with the care you need, when you need it.  Your next appointment:   6 months  The format for your next appointment:   In Person  Provider:   Sanda Klein, MD     Signed, Sanda Klein, MD  11/25/2018 2:40 PM    [place]

## 2018-12-04 NOTE — Progress Notes (Signed)
Carelink Summary Report / Loop Recorder 

## 2018-12-17 ENCOUNTER — Ambulatory Visit (INDEPENDENT_AMBULATORY_CARE_PROVIDER_SITE_OTHER): Payer: Medicare HMO | Admitting: *Deleted

## 2018-12-17 DIAGNOSIS — I639 Cerebral infarction, unspecified: Secondary | ICD-10-CM

## 2018-12-17 DIAGNOSIS — I255 Ischemic cardiomyopathy: Secondary | ICD-10-CM | POA: Diagnosis not present

## 2018-12-17 LAB — CUP PACEART REMOTE DEVICE CHECK
Date Time Interrogation Session: 20201119134608
Implantable Pulse Generator Implant Date: 20190131

## 2019-01-15 NOTE — Progress Notes (Signed)
Carelink Summary Report / Loop Recorder 

## 2019-01-19 ENCOUNTER — Ambulatory Visit (INDEPENDENT_AMBULATORY_CARE_PROVIDER_SITE_OTHER): Payer: Medicare HMO | Admitting: *Deleted

## 2019-01-19 DIAGNOSIS — I255 Ischemic cardiomyopathy: Secondary | ICD-10-CM | POA: Diagnosis not present

## 2019-01-19 LAB — CUP PACEART REMOTE DEVICE CHECK
Date Time Interrogation Session: 20201222134242
Implantable Pulse Generator Implant Date: 20190131

## 2019-02-22 ENCOUNTER — Ambulatory Visit (INDEPENDENT_AMBULATORY_CARE_PROVIDER_SITE_OTHER): Payer: Medicare HMO | Admitting: *Deleted

## 2019-02-22 DIAGNOSIS — I255 Ischemic cardiomyopathy: Secondary | ICD-10-CM | POA: Diagnosis not present

## 2019-02-22 LAB — CUP PACEART REMOTE DEVICE CHECK
Date Time Interrogation Session: 20210124232733
Implantable Pulse Generator Implant Date: 20190131

## 2019-03-03 ENCOUNTER — Other Ambulatory Visit: Payer: Self-pay | Admitting: Cardiovascular Disease

## 2019-03-29 ENCOUNTER — Ambulatory Visit (INDEPENDENT_AMBULATORY_CARE_PROVIDER_SITE_OTHER): Payer: Medicare HMO | Admitting: *Deleted

## 2019-03-29 DIAGNOSIS — I255 Ischemic cardiomyopathy: Secondary | ICD-10-CM | POA: Diagnosis not present

## 2019-03-29 LAB — CUP PACEART REMOTE DEVICE CHECK
Date Time Interrogation Session: 20210228231400
Implantable Pulse Generator Implant Date: 20190131

## 2019-03-29 NOTE — Progress Notes (Signed)
ILR Remote 

## 2019-04-05 ENCOUNTER — Encounter: Payer: Self-pay | Admitting: Sports Medicine

## 2019-04-05 ENCOUNTER — Ambulatory Visit (INDEPENDENT_AMBULATORY_CARE_PROVIDER_SITE_OTHER): Payer: Medicare HMO | Admitting: Sports Medicine

## 2019-04-05 ENCOUNTER — Other Ambulatory Visit: Payer: Self-pay

## 2019-04-05 ENCOUNTER — Ambulatory Visit (INDEPENDENT_AMBULATORY_CARE_PROVIDER_SITE_OTHER): Payer: Medicare HMO

## 2019-04-05 DIAGNOSIS — G8929 Other chronic pain: Secondary | ICD-10-CM | POA: Insufficient documentation

## 2019-04-05 DIAGNOSIS — M25522 Pain in left elbow: Secondary | ICD-10-CM | POA: Insufficient documentation

## 2019-04-05 DIAGNOSIS — M7702 Medial epicondylitis, left elbow: Secondary | ICD-10-CM

## 2019-04-05 NOTE — Progress Notes (Signed)
    Procedures performed today:    Procedure: Real-time Ultrasound Guided injection of the left common flexor tendon origin Device: Samsung [id number]  Verbal informed consent obtained.  Time-out conducted.  Noted no overlying erythema, induration, or other signs of local infection.  Skin prepped in a sterile fashion.  Local anesthesia: Topical Ethyl chloride.  With sterile technique and under real time ultrasound guidance:  1 cc Kenalog 40, 1 cc lidocaine, 1 cc bupivacaine injected easily, the patient did endorse paresthesias in an ulnar nerve distribution after the injection consistent with subcutaneous spread of the numbing agent to the ulnar nerve, this is normal and will wear off in a few hours.   Completed without difficulty  Pain immediately resolved suggesting accurate placement of the medication.  Advised to call if fevers/chills, erythema, induration, drainage, or persistent bleeding.  Images permanently stored and available for review in the ultrasound unit.  Impression: Technically successful ultrasound guided injection.  Independent interpretation of notes and tests performed by another provider:   None.  Impression and Recommendations:    Medial epicondylitis, left This is a pleasant 69 year old male with a long history of medial epicondylitis, left-sided, has had injections in the past, the last was maybe 10 years ago. 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 We performed a common flexor tendon injection today, adding home rehab exercises, x-rays. Return to see me if no better in a month.    ___________________________________________ Gwen Her. Dianah Field, M.D., ABFM., CAQSM. Primary Care and [place] Instructor of [employer] [facility] of Medicine

## 2019-04-05 NOTE — Assessment & Plan Note (Addendum)
This is a pleasant 69 year old male with a long history of medial epicondylitis, left-sided, has had injections in the past, the last was maybe 10 years ago. 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 We performed a common flexor tendon injection today, adding home rehab exercises, x-rays. Return to see me if no better in a month.

## 2019-05-03 ENCOUNTER — Ambulatory Visit (INDEPENDENT_AMBULATORY_CARE_PROVIDER_SITE_OTHER): Payer: Medicare HMO | Admitting: *Deleted

## 2019-05-03 DIAGNOSIS — I255 Ischemic cardiomyopathy: Secondary | ICD-10-CM

## 2019-05-04 LAB — CUP PACEART REMOTE DEVICE CHECK
Date Time Interrogation Session: 20210401001908
Implantable Pulse Generator Implant Date: 20190131

## 2019-05-06 ENCOUNTER — Ambulatory Visit: Payer: Medicare HMO | Admitting: Sports Medicine

## 2019-06-03 LAB — CUP PACEART REMOTE DEVICE CHECK
Date Time Interrogation Session: 20210502002755
Implantable Pulse Generator Implant Date: 20190131

## 2019-06-07 ENCOUNTER — Ambulatory Visit (INDEPENDENT_AMBULATORY_CARE_PROVIDER_SITE_OTHER): Payer: Medicare HMO | Admitting: *Deleted

## 2019-06-07 DIAGNOSIS — I63433 Cerebral infarction due to embolism of bilateral posterior cerebral arteries: Secondary | ICD-10-CM

## 2019-06-07 NOTE — Progress Notes (Signed)
Carelink Summary Report / Loop Recorder 

## 2019-07-05 ENCOUNTER — Telehealth: Payer: Self-pay

## 2019-07-05 NOTE — Telephone Encounter (Signed)
Carelink alert received- AF alert, no details available. Previous known false AF events.   Attempted to reach pt to request manual transmission.  No answer,m DPR on file.  Left message requesting he send manual transmission.

## 2019-07-06 NOTE — Telephone Encounter (Signed)
Late entry from 07/06/19:  Pt was advised to contact PCP by Douglass Rivers, CMA, at this RN's instruction due to reported dizzy spells while he was out of town. Pause and brady detection are enabled on pt's LINQ, no detected episodes since implant. Will await manual transmission for review of "AF" episode.

## 2019-07-06 NOTE — Telephone Encounter (Signed)
The pt was returning Amy, rn phone call. He states he was out of town for 10 days and did not take his medication like he supposed to. He felt dizzy a couple of days.  I was helping him send a manual transmission. The pt tried to send a manual twice and got the error code [id number]. I called tech support while he was on the phone. He will receive a new handheld in 5-7 business days.

## 2019-07-12 ENCOUNTER — Ambulatory Visit (INDEPENDENT_AMBULATORY_CARE_PROVIDER_SITE_OTHER): Payer: Medicare HMO | Admitting: *Deleted

## 2019-07-12 DIAGNOSIS — I639 Cerebral infarction, unspecified: Secondary | ICD-10-CM

## 2019-07-12 LAB — CUP PACEART REMOTE DEVICE CHECK
Date Time Interrogation Session: 20210613211343
Date Time Interrogation Session: 20210613231728
Implantable Pulse Generator Implant Date: 20190131
Implantable Pulse Generator Implant Date: 20190131

## 2019-07-13 NOTE — Progress Notes (Signed)
Carelink Summary Report / Loop Recorder 

## 2019-08-16 ENCOUNTER — Ambulatory Visit (INDEPENDENT_AMBULATORY_CARE_PROVIDER_SITE_OTHER): Payer: Medicare HMO | Admitting: *Deleted

## 2019-08-16 DIAGNOSIS — I63433 Cerebral infarction due to embolism of bilateral posterior cerebral arteries: Secondary | ICD-10-CM | POA: Diagnosis not present

## 2019-08-16 LAB — CUP PACEART REMOTE DEVICE CHECK
Date Time Interrogation Session: 20210718232257
Implantable Pulse Generator Implant Date: 20190131

## 2019-08-18 NOTE — Progress Notes (Signed)
Carelink Summary Report / Loop Recorder 

## 2019-09-19 LAB — CUP PACEART REMOTE DEVICE CHECK
Date Time Interrogation Session: 20210819001406
Implantable Pulse Generator Implant Date: 20190131

## 2019-09-20 ENCOUNTER — Ambulatory Visit (INDEPENDENT_AMBULATORY_CARE_PROVIDER_SITE_OTHER): Payer: Medicare HMO | Admitting: *Deleted

## 2019-09-20 DIAGNOSIS — I63433 Cerebral infarction due to embolism of bilateral posterior cerebral arteries: Secondary | ICD-10-CM

## 2019-09-20 NOTE — Progress Notes (Signed)
Carelink Summary Report / Loop Recorder 

## 2019-09-29 ENCOUNTER — Telehealth: Payer: Self-pay | Admitting: Cardiovascular Disease

## 2019-09-29 NOTE — Telephone Encounter (Signed)
Pt c/o BP issue: STAT if pt c/o blurred vision, one-sided weakness or slurred speech  1. What are your last 5 BP readings? Systolic pressure has been ranging between 105, 669, & 167 Diastolic number has been in the low 60's   2. Are you having any other symptoms (ex. Dizziness, headache, blurred vision, passed out)? Passed out last Friday 09/24/19, has not had any symptoms since.   3. What is your BP issue? Renner is calling wanting to know why he has been experiencing hypotension. He states last Friday, 09/24/19 he passed out and when he came to he could not see. Everything was grey and his vision slowly came back. He states he has not had any symptoms since that occurrence. Please advise.

## 2019-09-29 NOTE — Telephone Encounter (Signed)
Attempted to call the pt twice but phone rang several times with no answer.. will try again this afternoon.

## 2019-10-01 NOTE — Telephone Encounter (Signed)
Left message for pt to call.

## 2019-10-05 NOTE — Telephone Encounter (Signed)
Returned the call to the patient. He stated that he had an episode last week where he passed out suddenly. He had been painting and when he leaned over, he passed out. He stated that he did not get hurt therefore he did not get checked.   Blood pressures has been running 106/57 and 115/72 after his morning medications. He did not have any other specific readings to provide. Blood pressure while on the phone was 133/79 and heart rate was 60.  He has been advised to rise slowly from a sitting position and to keep a log of his blood pressures to bring to his next appointment. He is past due for a follow up. An appointment has been made for 10/07/19 with Dr. [name].

## 2019-10-05 NOTE — Telephone Encounter (Signed)
Pt is returning call.  

## 2019-10-05 NOTE — Telephone Encounter (Signed)
He can do a loop recorder download or we can check it when he comes in Thursday.

## 2019-10-06 NOTE — Telephone Encounter (Signed)
No abnormal heart rhythms in over 3 months. Sounds like orthostatic hypotension. Have a salty snack today, drink plenty of water. Will discuss med changes in office.

## 2019-10-06 NOTE — Telephone Encounter (Signed)
Download has been completed, per the patient.

## 2019-10-07 ENCOUNTER — Encounter: Payer: Self-pay | Admitting: Cardiovascular Disease

## 2019-10-07 ENCOUNTER — Ambulatory Visit: Payer: Medicare HMO | Admitting: Cardiovascular Disease

## 2019-10-07 ENCOUNTER — Other Ambulatory Visit: Payer: Self-pay

## 2019-10-07 VITALS — BP 135/68 | HR 65 | Ht 66.0 in | Wt 157.0 lb

## 2019-10-07 DIAGNOSIS — R55 Syncope and collapse: Secondary | ICD-10-CM

## 2019-10-07 DIAGNOSIS — E7800 Pure hypercholesterolemia, unspecified: Secondary | ICD-10-CM | POA: Diagnosis not present

## 2019-10-07 DIAGNOSIS — I10 Essential (primary) hypertension: Secondary | ICD-10-CM

## 2019-10-07 DIAGNOSIS — I5042 Chronic combined systolic (congestive) and diastolic (congestive) heart failure: Secondary | ICD-10-CM | POA: Diagnosis not present

## 2019-10-07 DIAGNOSIS — I2510 Atherosclerotic heart disease of native coronary artery without angina pectoris: Secondary | ICD-10-CM | POA: Diagnosis not present

## 2019-10-07 DIAGNOSIS — Z8673 Personal history of transient ischemic attack (TIA), and cerebral infarction without residual deficits: Secondary | ICD-10-CM

## 2019-10-07 DIAGNOSIS — E119 Type 2 diabetes mellitus without complications: Secondary | ICD-10-CM

## 2019-10-07 NOTE — Progress Notes (Signed)
Cardiology Office Note:    Date:  10/07/2019   ID:  Justin [name], DOB 10-16-1950, MRN [id number]  PCP:  Heinz Knuckles, MD  Cardiologist:  Sanda Klein, MD   Referring MD: Heinz Knuckles, MD   Chief Complaint  Patient presents with   Loss of Consciousness    History of Present Illness:    Justin [name] is a 69 y.o. 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    Syncopal events roughly 10 days ago.  He was outside on the porch on a hot day, standing up watching his wife paint.  All of a sudden his vision became blurry.  He felt dizzy and got down on all fours.  His vision then became gray and he briefly passed out.  Wife reports that as soon as he hit the ground he woke up.  He felt weak and did not feel ready get up off the ground for about 10 or 15 minutes.  This has not recurred since.  Earlier that month he been able to go on a fishing trip with his girlfriend and fishing competitions, without any complaints.  He denies angina or chest pain the other rest or with activity, orthopnea, PND, leg edema, new focal neurological deficits, intermittent claudication, cough hemoptysis, wheezing, fever, chills.  Etc.  He has lost about 15 pounds since we initiated his heart failure treatment.  He does not take diuretics.  His typical morning meal includes coffee, rarely other liquids.  He does not drink water throughout the day.  The cost of his Delene Loll has gone up substantially since he has entered the "donut hole" with Medicare part D.  His income is too high to qualify for the patient assistance program.  In January 2019 , presented with CVA and he underwent transesophageal echo which did not show embolic source and had a loop recorder placed.  However the echo evaluation showed evidence  of moderate left ventricular systolic dysfunction with an ejection fraction of 35%, mild dilation of the left ventricular chamber and regional wall motion abnormalities with akinesis of the entire distribution of the right coronary artery and grade 2 diastolic dysfunction.  The echo abnormalities were new when compared to a study from 2010. Cath showed CAD and received a DES to proximal RCA March 2019, followed by improvement in LVEF to 40-45%).  So far, his loop recorder has not shown any evidence of atrial fibrillation.  His blood pressure is a little high today, but his typical blood pressure at home (he showed me his log) is 110-120/60-70s.  He occasionally has dizziness and staggers a little bit, but he has not had any episodes of syncope or falls.  Past Medical History:  Diagnosis Date   Abnormal echocardiogram 04/03/2017   CAD S/P percutaneous coronary angioplasty 04/02/17 DES to RCA 04/02/2017   Calcium oxalate renal stones    Chewing tobacco use    Gallstones    hx/o, s/p choleycystectomy   H/O echocardiogram 10/2008   no wall thickness, EF 55%, mild mitral regurge   Hyperlipidemia    Hypertension    hx/o, noncompliance prior with medication   Influenza vaccine refused 09/2013   Rheumatic fever    age 42   Stroke ([place]) 10/02/2008   Tinnitus     Past Surgical History:  Procedure Laterality Date  blood clot  1997   brain   [place]   craniotomy, evacuation of hematoma   CHOLECYSTECTOMY  09/06/2011   Procedure: LAPAROSCOPIC CHOLECYSTECTOMY WITH INTRAOPERATIVE CHOLANGIOGRAM;  Surgeon: Earnstine Regal, MD;  Location: [facility];  Service: General;  Laterality: N/A;   COLONOSCOPY  01/04/14   normal. Dr. Owens Loffler   CORONARY STENT INTERVENTION N/A 04/02/2017   Procedure: CORONARY STENT INTERVENTION;  Surgeon: Leonie Man, MD;  Location: [facility];  Service: Cardiovascular;  Laterality: N/A;   Dental implant     INTRAVASCULAR PRESSURE WIRE/FFR STUDY  N/A 04/02/2017   Procedure: INTRAVASCULAR PRESSURE WIRE/FFR STUDY;  Surgeon: Leonie Man, MD;  Location: [facility];  Service: Cardiovascular;  Laterality: N/A;   JOINT REPLACEMENT  2004   LEFT BICEP REPAIR, RTC repair   LEFT HEART CATH AND CORONARY ANGIOGRAPHY N/A 04/02/2017   Procedure: LEFT HEART CATH AND CORONARY ANGIOGRAPHY;  Surgeon: Leonie Man, MD;  Location: [facility];  Service: Cardiovascular;  Laterality: N/A;   LOOP RECORDER INSERTION N/A 02/27/2017   Procedure: LOOP RECORDER INSERTION;  Surgeon: Sanda Klein, MD;  Location: [facility];  Service: Cardiovascular;  Laterality: N/A;   LOOP RECORDER INSERTION N/A 02/27/2017   Procedure: LOOP RECORDER INSERTION;  Surgeon: Pixie Casino, MD;  Location: [facility];  Service: Cardiovascular;  Laterality: N/A;   ROTATOR CUFF REPAIR     Right   SIGMOIDOSCOPY     TEE WITHOUT CARDIOVERSION N/A 02/27/2017   Procedure: TRANSESOPHAGEAL ECHOCARDIOGRAM (TEE);  Surgeon: Pixie Casino, MD;  Location: [facility];  Service: Cardiovascular;  Laterality: N/A;    Current Medications: Current Meds  Medication Sig   aspirin 81 MG chewable tablet Chew 1 tablet (81 mg total) by mouth daily.   atorvastatin (LIPITOR) 40 MG tablet TAKE 1 TABLET (40 MG TOTAL) BY MOUTH DAILY AT 6 PM.   carvedilol (COREG) 6.25 MG tablet TAKE 1 TABLET BY MOUTH TWICE A DAY   clopidogrel (PLAVIX) 75 MG tablet TAKE 1 TABLET BY MOUTH EVERY DAY   ENTRESTO 97-103 MG TAKE 1 TABLET BY MOUTH TWICE A DAY   Multiple Vitamins-Minerals (PRESERVISION AREDS 2) CAPS Take 2 capsules by mouth daily.   nitroGLYCERIN (NITROSTAT) 0.4 MG SL tablet Place 1 tablet (0.4 mg total) under the tongue every 5 (five) minutes as needed for chest pain.     Allergies:   Patient has no known allergies.   Social History   Socioeconomic History   Marital status: Single    Spouse name: Not on file   Number of children: 2   Years of education: Not on  file   Highest education level: Not on file  Occupational History    Employer: [employer]  Tobacco Use   Smoking status: Never Smoker   Smokeless tobacco: Current User    Types: Chew  Vaping Use   Vaping Use: Never used  Substance and Sexual Activity   Alcohol use: No    Alcohol/week: 0.0 standard drinks   Drug use: No   Sexual activity: Not on file  Other Topics Concern   Not on file  Social History Narrative   Single, has 2 sons, 50yo and 81yo, electrician, walks regularly, 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     Social Determinants of Health   Financial Resource Strain:    Difficulty of Paying Living Expenses: Not on file  Food Insecurity:    Worried About Running Out of  Food in the Last Year: Not on file   Ran Out of Food in the Last Year: Not on file  Transportation Needs:    Lack of Transportation (Medical): Not on file   Lack of Transportation (Non-Medical): Not on file  Physical Activity:    Days of Exercise per Week: Not on file   Minutes of Exercise per Session: Not on file  Stress:    Feeling of Stress : Not on file  Social Connections:    Frequency of Communication with Friends and Family: Not on file   Frequency of Social Gatherings with Friends and Family: Not on file   Attends Religious Services: Not on file   Active Member of Clubs or Organizations: Not on file   Attends Archivist Meetings: Not on file   Marital Status: Not on file     Family History: The patient's family history includes Alcohol abuse in his father; Cirrhosis in his brother; Diabetes in his brother; Heart disease (age of onset: 66) in his mother; Migraines in his mother. There is no history of Colon cancer, Cancer, Stroke, Hypertension, or Hyperlipidemia.  ROS:   Please see the history of present illness.    All other systems are reviewed and are negative.   EKGs/Labs/Other Studies Reviewed:    The following studies were reviewed  today: Complete implantable loop recorder download from a couple of days ago  EKG:  EKG is ordered today shows sinus rhythm with a couple of PVCs, otherwise a normal tracing.  QTc normal at 436 ms  Recent Labs: No results found for requested labs within last 8760 hours.   Recent Lipid Panel    Component Value Date/Time   CHOL 211 (H) 02/26/2017 0328   TRIG 153 (H) 02/26/2017 0328   HDL 41 02/26/2017 0328   CHOLHDL 5.1 02/26/2017 0328   VLDL 31 02/26/2017 0328   LDLCALC 139 (H) 02/26/2017 0328   Jun 16, 2017, [facility] Total cholesterol 109, triglycerides 190, HDL 40, LDL 31 March 17, 2017  hemoglobin A1c 5.7%    Physical Exam:    VS:  BP 135/68    Pulse 65    Ht 5\' 6"  (1.676 m)    Wt 157 lb (71.2 kg)    SpO2 99%    BMI 25.34 kg/m     Wt Readings from Last 3 Encounters:  10/07/19 157 lb (71.2 kg)  11/25/18 168 lb 3.2 oz (76.3 kg)  12/11/17 169 lb (76.7 kg)    General: Alert, oriented x3, no distress, appears lean and fit.  Loop recorder site looks healthy. Head: no evidence of trauma, PERRL, EOMI, no exophtalmos or lid lag, no myxedema, no xanthelasma; normal ears, nose and oropharynx Neck: normal jugular venous pulsations and no hepatojugular reflux; brisk carotid pulses without delay and no carotid bruits Chest: clear to auscultation, no signs of consolidation by percussion or palpation, normal fremitus, symmetrical and full respiratory excursions Cardiovascular: normal position and quality of the apical impulse, regular rhythm, normal first and second heart sounds, no murmurs, rubs or gallops Abdomen: no tenderness or distention, no masses by palpation, no abnormal pulsatility or arterial bruits, normal bowel sounds, no hepatosplenomegaly Extremities: no clubbing, cyanosis or edema; 2+ radial, ulnar and brachial pulses bilaterally; 2+ right femoral, posterior tibial and dorsalis pedis pulses; 2+ left femoral, posterior tibial and dorsalis pedis pulses; no subclavian  or femoral bruits Neurological: grossly nonfocal Psych: Normal mood and affect   ASSESSMENT:    1. Chronic combined systolic  and diastolic heart failure ([place])   2. Syncope and collapse   3. Coronary artery disease involving native coronary artery of native heart without angina pectoris   4. Essential hypertension   5. Hypercholesterolemia   6. Diabetes mellitus type 2 in nonobese (HCC)   7. History of embolic stroke    PLAN:    In order of problems listed above:  1. CHF: Excellent Functional status (NYHA class I) and euvolemic without diuretics.   2. Syncope: Presentation highly consistent with hypertensive syncope and he does have a longstanding history of issues with orthostatic hypotension in the remote past.  His loop recorder did not show any arrhythmia (no abnormal episodes reported since June).  Encouraged him to drink water or other fluids in addition to coffee and avoid prolonged standing without moving around.  Since this has been a single episode, would not change his medications.  Option to cut back on the Entresto in the future if dizzy spells or syncope recur. 3. CAD: He does not have angina.  Coronary disease was discovered incidentally during work-up for cardiomyopathy.  Remains asymptomatic.  There is evidence of some permanent injury in the distribution of the right coronary artery, but LVEF has improved following stent placement and optimization of heart failure medications. 4. HTN: He has lost quite a bit of weight and is possible that we will have to decrease his heart failure medications. 5. HLP: Atorvastatin.  Labs not checked since 2019, will recheck. 6. DM: I suspect that his blood sugar control has only improved with his weight loss. 7. Embolic CVA: pattern was strongly suggestive of embolic disease with involvement of multiple vascular territories.  He has made excellent neurological recovery.  No atrial fibrillation has been detected been well over 2 years of  monitoring.  Medication Adjustments/Labs and Tests Ordered: Current medicines are reviewed at length with the patient today.  Concerns regarding medicines are outlined above.  Orders Placed This Encounter  Procedures   Basic metabolic panel   Hemoglobin A1c   Magnesium   EKG 12-Lead   No orders of the defined types were placed in this encounter.  Patient Instructions  Medication Instructions:  No changes *If you need a refill on your cardiac medications before your next appointment, please call your pharmacy*   Lab Work: Your provider would like for you to have the following labs today: BMET, A1C and Magnesium  If you have labs (blood work) drawn today and your tests are completely normal, you will receive your results only by:  [place] (if you have MyChart) OR  A paper copy in the mail If you have any lab test that is abnormal or we need to change your treatment, we will call you to review the results.   Testing/Procedures: None ordered   Follow-Up: At [facility], you and your health needs are our priority.  As part of our continuing mission to provide you with exceptional heart care, we have created designated Provider Care Teams.  These Care Teams include your primary Cardiologist (physician) and Advanced Practice Providers (APPs -  Physician Assistants and Nurse Practitioners) who all work together to provide you with the care you need, when you need it.  We recommend signing up for the patient portal called "MyChart".  Sign up information is provided on this After Visit Summary.  MyChart is used to connect with patients for Virtual Visits (Telemedicine).  Patients are able to view lab/test results, encounter notes, upcoming appointments, etc.  Non-urgent messages  can be sent to your provider as well.   To learn more about what you can do with MyChart, go to [email or web address].    Your next appointment:   12 month(s)  The format for your next  appointment:   In Person  Provider:   You may see Sanda Klein, MD or one of the following Advanced Practice Providers on your designated Care Team:    Almyra Deforest, PA-C  Fabian Sharp, [place] or   Roby Lofts, PA-C       Signed, Sanda Klein, MD  10/07/2019 1:39 PM    [place] Patient

## 2019-10-07 NOTE — Patient Instructions (Signed)
Medication Instructions:  No changes *If you need a refill on your cardiac medications before your next appointment, please call your pharmacy*   Lab Work: Your provider would like for you to have the following labs today: BMET, A1C and Magnesium  If you have labs (blood work) drawn today and your tests are completely normal, you will receive your results only by: Marland Kitchen MyChart Message (if you have MyChart) OR . A paper copy in the mail If you have any lab test that is abnormal or we need to change your treatment, we will call you to review the results.   Testing/Procedures: None ordered   Follow-Up: At [facility], you and your health needs are our priority.  As part of our continuing mission to provide you with exceptional heart care, we have created designated Provider Care Teams.  These Care Teams include your primary Cardiologist (physician) and Advanced Practice Providers (APPs -  Physician Assistants and Nurse Practitioners) who all work together to provide you with the care you need, when you need it.  We recommend signing up for the patient portal called "MyChart".  Sign up information is provided on this After Visit Summary.  MyChart is used to connect with patients for Virtual Visits (Telemedicine).  Patients are able to view lab/test results, encounter notes, upcoming appointments, etc.  Non-urgent messages can be sent to your provider as well.   To learn more about what you can do with MyChart, go to [email or web address].    Your next appointment:   12 month(s)  The format for your next appointment:   In Person  Provider:   You may see Sanda Klein, MD or one of the following Advanced Practice Providers on your designated Care Team:    Almyra Deforest, PA-C  Fabian Sharp, PA-C or   Roby Lofts, [place]

## 2019-10-08 LAB — BASIC METABOLIC PANEL
BUN/Creatinine Ratio: 16 (ref 10–24)
BUN: 14 mg/dL (ref 8–27)
CO2: 24 mmol/L (ref 20–29)
Calcium: 9.4 mg/dL (ref 8.6–10.2)
Chloride: 103 mmol/L (ref 96–106)
Creatinine, Ser: 0.88 mg/dL (ref 0.76–1.27)
GFR calc Af Amer: 101 mL/min/{1.73_m2} (ref >59)
GFR calc non Af Amer: 87 mL/min/{1.73_m2} (ref >59)
Glucose: 124 mg/dL — ABNORMAL HIGH (ref 65–99)
Potassium: 4.5 mmol/L (ref 3.5–5.2)
Sodium: 140 mmol/L (ref 134–144)

## 2019-10-08 LAB — MAGNESIUM: Magnesium: 2 mg/dL (ref 1.6–2.3)

## 2019-10-08 LAB — HEMOGLOBIN A1C
Est. average glucose Bld gHb Est-mCnc: 163 mg/dL
Hgb A1c MFr Bld: 7.3 % — ABNORMAL HIGH (ref 4.8–5.6)

## 2019-10-12 ENCOUNTER — Encounter: Payer: Self-pay | Admitting: *Deleted

## 2019-10-13 ENCOUNTER — Telehealth: Payer: Self-pay | Admitting: Cardiovascular Disease

## 2019-10-13 NOTE — Telephone Encounter (Signed)
Pt called and verbalized understanding and results mailed to him with a list of complex carbohydrates to help him make some diet modifications. Pt will call back if he develops any further questions.

## 2019-10-13 NOTE — Telephone Encounter (Signed)
    Patient calling for lab results 

## 2019-10-25 ENCOUNTER — Ambulatory Visit (INDEPENDENT_AMBULATORY_CARE_PROVIDER_SITE_OTHER): Payer: Medicare HMO | Admitting: Emergency Medicine

## 2019-10-25 DIAGNOSIS — I639 Cerebral infarction, unspecified: Secondary | ICD-10-CM | POA: Diagnosis not present

## 2019-10-25 LAB — CUP PACEART REMOTE DEVICE CHECK
Date Time Interrogation Session: 20210919003003
Implantable Pulse Generator Implant Date: 20190131

## 2019-10-27 NOTE — Progress Notes (Signed)
Carelink Summary Report / Loop Recorder 

## 2019-12-20 ENCOUNTER — Ambulatory Visit (INDEPENDENT_AMBULATORY_CARE_PROVIDER_SITE_OTHER): Payer: Medicare HMO

## 2019-12-20 DIAGNOSIS — I639 Cerebral infarction, unspecified: Secondary | ICD-10-CM

## 2019-12-20 LAB — CUP PACEART REMOTE DEVICE CHECK
Date Time Interrogation Session: 20211120013217
Implantable Pulse Generator Implant Date: 20190131

## 2019-12-21 NOTE — Progress Notes (Signed)
Carelink Summary Report / Loop Recorder 

## 2020-01-11 ENCOUNTER — Ambulatory Visit (INDEPENDENT_AMBULATORY_CARE_PROVIDER_SITE_OTHER): Payer: Medicare HMO | Admitting: Sports Medicine

## 2020-01-11 ENCOUNTER — Other Ambulatory Visit: Payer: Self-pay

## 2020-01-11 DIAGNOSIS — G8929 Other chronic pain: Secondary | ICD-10-CM | POA: Diagnosis not present

## 2020-01-11 DIAGNOSIS — M25522 Pain in left elbow: Secondary | ICD-10-CM

## 2020-01-11 MED ORDER — HYDROCODONE-ACETAMINOPHEN 5-325 MG PO TABS: 1.0000 | ORAL_TABLET | Freq: Three times a day (TID) | ORAL | 0 refills | Status: DC | PRN

## 2020-01-11 NOTE — Assessment & Plan Note (Signed)
This is a pleasant 69 year old male, chronic left elbow pain, we did a common flexor tendon injection at the last visit, no improvement. Today he refers his pain predominantly over the ulnar nerve proximal to the medial epicondyle. He may be dealing with more of a cubital tunnel syndrome at this point. Adding an elbow sleeve for protection. Due to persistent pain, unrevealing x-rays, and greater than 6 weeks of physician directed conservative measures we are going to proceed with an MRI. Hydrocodone for pain relief in the meantime, we may be doing an ulnar nerve hydrodissection at the follow-up visit based on MRI findings.

## 2020-01-11 NOTE — Progress Notes (Signed)
    Procedures performed today:    None.  Independent interpretation of notes and tests performed by another provider:   None.  Brief History, Exam, Impression, and Recommendations:    Elbow pain, chronic, left This is a pleasant 69 year old male, chronic left elbow pain, we did a common flexor tendon injection at the last visit, no improvement. Today he refers his pain predominantly over the ulnar nerve proximal to the medial epicondyle. He may be dealing with more of a cubital tunnel syndrome at this point. Adding an elbow sleeve for protection. Due to persistent pain, unrevealing x-rays, and greater than 6 weeks of physician directed conservative measures we are going to proceed with an MRI. Hydrocodone for pain relief in the meantime, we may be doing an ulnar nerve hydrodissection at the follow-up visit based on MRI findings.    ___________________________________________ Gwen Her. Dianah Field, M.D., ABFM., CAQSM. Primary Care and [place] In[employer] of [employer] [facility] [employer] [facility]

## 2020-01-18 ENCOUNTER — Ambulatory Visit (INDEPENDENT_AMBULATORY_CARE_PROVIDER_SITE_OTHER): Payer: Medicare HMO

## 2020-01-18 DIAGNOSIS — I639 Cerebral infarction, unspecified: Secondary | ICD-10-CM | POA: Diagnosis not present

## 2020-01-18 LAB — CUP PACEART REMOTE DEVICE CHECK
Date Time Interrogation Session: 20211221013207
Implantable Pulse Generator Implant Date: 20190131

## 2020-02-01 NOTE — Progress Notes (Signed)
Carelink Summary Report / Loop Recorder 

## 2020-02-18 ENCOUNTER — Ambulatory Visit (INDEPENDENT_AMBULATORY_CARE_PROVIDER_SITE_OTHER): Payer: Medicare HMO

## 2020-02-18 DIAGNOSIS — I639 Cerebral infarction, unspecified: Secondary | ICD-10-CM | POA: Diagnosis not present

## 2020-02-20 LAB — CUP PACEART REMOTE DEVICE CHECK
Date Time Interrogation Session: 20220123013032
Implantable Pulse Generator Implant Date: 20190131

## 2020-02-26 ENCOUNTER — Other Ambulatory Visit: Payer: Self-pay | Admitting: Cardiovascular Disease

## 2020-03-01 ENCOUNTER — Other Ambulatory Visit: Payer: Self-pay | Admitting: Cardiovascular Disease

## 2020-03-01 NOTE — Progress Notes (Signed)
Carelink Summary Report / Loop Recorder 

## 2020-03-07 ENCOUNTER — Telehealth: Payer: Self-pay

## 2020-03-07 NOTE — Telephone Encounter (Signed)
Patient called very upset that he has not heard anything about his MRI of his elbow that was ordered in December. Can you check into this and see what the problem is?

## 2020-03-07 NOTE — Telephone Encounter (Signed)
This was authorized and was sent to [facility] due to pt having pacemaker.  They should have contacted him by now.  That's where the ball was dropped.

## 2020-03-07 NOTE — Telephone Encounter (Signed)
Left message on VM for patient to call scheduling at [phone].

## 2020-03-12 ENCOUNTER — Other Ambulatory Visit: Payer: Self-pay

## 2020-03-12 ENCOUNTER — Ambulatory Visit (HOSPITAL_COMMUNITY)
Admission: RE | Admit: 2020-03-12 | Discharge: 2020-03-12 | Disposition: A | Discharge status: Home / Self Care | Payer: Medicare HMO | Source: Ambulatory Visit | Attending: Sports Medicine | Admitting: Sports Medicine

## 2020-03-12 DIAGNOSIS — M25522 Pain in left elbow: Secondary | ICD-10-CM | POA: Insufficient documentation

## 2020-03-12 DIAGNOSIS — G8929 Other chronic pain: Secondary | ICD-10-CM

## 2020-03-17 ENCOUNTER — Other Ambulatory Visit: Payer: Self-pay | Admitting: Sports Medicine

## 2020-03-17 DIAGNOSIS — G8929 Other chronic pain: Secondary | ICD-10-CM

## 2020-03-17 DIAGNOSIS — M25522 Pain in left elbow: Secondary | ICD-10-CM

## 2020-03-20 ENCOUNTER — Ambulatory Visit (INDEPENDENT_AMBULATORY_CARE_PROVIDER_SITE_OTHER): Payer: Medicare HMO

## 2020-03-20 DIAGNOSIS — I639 Cerebral infarction, unspecified: Secondary | ICD-10-CM

## 2020-03-24 ENCOUNTER — Telehealth: Payer: Self-pay | Admitting: *Deleted

## 2020-03-24 LAB — CUP PACEART REMOTE DEVICE CHECK
Date Time Interrogation Session: 20220225013514
Implantable Pulse Generator Implant Date: 20190131

## 2020-03-24 NOTE — Progress Notes (Signed)
Carelink Summary Report / Loop Recorder 

## 2020-03-24 NOTE — Telephone Encounter (Signed)
   [facility] Pre-operative Risk Assessment     Request for surgical clearance:  1. What type of surgery is being performed? Left elbow debridement, tendon repair   2. When is this surgery scheduled? TBD   3. What type of clearance is required (medical clearance vs. Pharmacy clearance to hold med vs. Both)?  both  4. Are there any medications that need to be held prior to surgery and how long? ASA, Plavix  5. Practice name and name of physician performing surgery? [facility] Basil   6. What is the office phone number? [phone]   7.   What is the office fax number? [phone] attn: Sherri  8.   Anesthesia type (None, local, MAC, general) ?    Justin [name] Justin [name] 03/24/2020, 3:02 PM  _________________________________________________________________

## 2020-03-27 NOTE — Telephone Encounter (Signed)
Patient called back returning Scott's Call. Please call patient on his mobile phone [phone]

## 2020-03-27 NOTE — Telephone Encounter (Addendum)
   Primary Cardiologist: Sanda Klein, MD  Chart reviewed as part of pre-operative protocol coverage.   70 yo male with: - CAD s/p PCI with DES to RCA in 03/2017 - (HFrEF) heart failure with reduced ejection fraction 2/2 ischemic cardiomyopathy [EF 35 >> 74-82] - Embolic CVA 07/784; s/p ILR - Hx of syncope - HTN - HLD  - DM2 (no insulin) - 10/07/2019: Creatinine, Ser 0.88   Last seen by Dr. [name]: 10/07/19   RCRI:  Perioperative Risk of Major Cardiac Event is (%): 11 (high risk) DASI:  Functional Capacity in METs is: 5.81 (functional status is good )  Patient was contacted 03/27/2020 in reference to pre-operative risk assessment for pending surgery as outlined below.    Since last seen, PENG THORSTENSON has done well without chest pain or shortness of breath.  The patient is ok to proceed at acceptable risk.  He has been on ASA and Plavix since his CVA and PCI in 2019.   It would be reasonable to recommend holding Plavix 5 days and continuing ASA if ok with the surgeon (I have already communicated this to the patient). I will review with Dr. [name] regarding holding plavix +/- ASA.  If recommendations are different than above, we will need to update the patient with directions.  Richardson Dopp, PA-C 03/27/2020, 4:26 PM

## 2020-03-27 NOTE — Telephone Encounter (Addendum)
Left message for pt to call back.  Richardson Dopp, PA-C    03/27/2020 10:50 AM

## 2020-03-29 ENCOUNTER — Other Ambulatory Visit: Payer: Self-pay

## 2020-03-29 MED ORDER — ATORVASTATIN CALCIUM 40 MG PO TABS: 40.0000 mg | ORAL_TABLET | Freq: Every day | ORAL | 7 refills | Status: DC

## 2020-03-29 NOTE — Telephone Encounter (Signed)
  I fully agree with that plan. Thanks, [employer]

## 2020-04-20 ENCOUNTER — Ambulatory Visit (INDEPENDENT_AMBULATORY_CARE_PROVIDER_SITE_OTHER): Payer: Medicare HMO

## 2020-04-20 DIAGNOSIS — I255 Ischemic cardiomyopathy: Secondary | ICD-10-CM | POA: Diagnosis not present

## 2020-04-26 LAB — CUP PACEART REMOTE DEVICE CHECK
Date Time Interrogation Session: 20220330023510
Implantable Pulse Generator Implant Date: 20190131

## 2020-05-01 NOTE — Progress Notes (Signed)
Carelink Summary Report / Loop Recorder 

## 2020-05-29 ENCOUNTER — Ambulatory Visit (INDEPENDENT_AMBULATORY_CARE_PROVIDER_SITE_OTHER): Payer: Medicare HMO

## 2020-05-29 DIAGNOSIS — I255 Ischemic cardiomyopathy: Secondary | ICD-10-CM

## 2020-05-30 LAB — CUP PACEART REMOTE DEVICE CHECK
Date Time Interrogation Session: 20220502024158
Implantable Pulse Generator Implant Date: 20190131

## 2020-06-16 NOTE — Progress Notes (Signed)
Carelink Summary Report / Loop Recorder 

## 2020-06-29 ENCOUNTER — Ambulatory Visit (INDEPENDENT_AMBULATORY_CARE_PROVIDER_SITE_OTHER): Payer: Medicare HMO

## 2020-06-29 DIAGNOSIS — I255 Ischemic cardiomyopathy: Secondary | ICD-10-CM | POA: Diagnosis not present

## 2020-07-01 LAB — CUP PACEART REMOTE DEVICE CHECK
Date Time Interrogation Session: 20220604030749
Implantable Pulse Generator Implant Date: 20190131

## 2020-07-24 NOTE — Progress Notes (Signed)
Carelink Summary Report / Loop Recorder 

## 2020-08-01 ENCOUNTER — Ambulatory Visit: Payer: Medicare HMO

## 2020-08-03 LAB — CUP PACEART REMOTE DEVICE CHECK
Date Time Interrogation Session: 20220707025159
Implantable Pulse Generator Implant Date: 20190131

## 2020-08-31 ENCOUNTER — Ambulatory Visit (INDEPENDENT_AMBULATORY_CARE_PROVIDER_SITE_OTHER): Payer: Medicare HMO

## 2020-08-31 DIAGNOSIS — I255 Ischemic cardiomyopathy: Secondary | ICD-10-CM

## 2020-08-31 DIAGNOSIS — I639 Cerebral infarction, unspecified: Secondary | ICD-10-CM

## 2020-09-05 LAB — CUP PACEART REMOTE DEVICE CHECK
Date Time Interrogation Session: 20220809025206
Implantable Pulse Generator Implant Date: 20190131

## 2020-09-22 NOTE — Progress Notes (Signed)
Carelink Summary Report / Loop Recorder 

## 2020-10-01 ENCOUNTER — Other Ambulatory Visit: Payer: Self-pay | Admitting: Cardiovascular Disease

## 2020-10-09 ENCOUNTER — Ambulatory Visit (INDEPENDENT_AMBULATORY_CARE_PROVIDER_SITE_OTHER): Payer: Medicare HMO

## 2020-10-09 DIAGNOSIS — I639 Cerebral infarction, unspecified: Secondary | ICD-10-CM | POA: Diagnosis not present

## 2020-10-09 LAB — CUP PACEART REMOTE DEVICE CHECK
Date Time Interrogation Session: 20220911025531
Implantable Pulse Generator Implant Date: 20190131

## 2020-10-13 NOTE — Progress Notes (Signed)
Carelink Summary Report / Loop Recorder 

## 2020-11-06 ENCOUNTER — Telehealth: Payer: Self-pay

## 2020-11-06 NOTE — Telephone Encounter (Signed)
"  LINQ alert received, device reached RRT 10/5 Route to triage LR"  Unsuccessful telephone encounter to patient to discuss linq at RRT status. Hipaa compliant VM message left requesting call back to [phone]. Remote montiroing appointments cancelled in epic, patient marked inactive in paceart. Discontinued in carelink. Will send return kit once home address confirmed upon call back from patient.

## 2020-11-06 NOTE — Telephone Encounter (Signed)
I let the patient know that his loop recorder battery has reached RRT. The patient states he do not want it to be removed. I told him it is safe to keep the loop in. I told him I will send a return kit for him to return the monitor. The patient thanked me for my help.

## 2020-11-06 NOTE — Telephone Encounter (Signed)
2nd attempt to contact patient about ILR @ RRT. No answer, LMTCB.

## 2020-11-07 NOTE — Telephone Encounter (Signed)
Patient advised ILR has reached RRT, discussed options to leave in or have device removed. Patient would like to leave device in. Return kit sent to patients address on file. Advised to call with any further questions or concerns.

## 2020-11-27 ENCOUNTER — Other Ambulatory Visit: Payer: Self-pay

## 2020-11-27 ENCOUNTER — Ambulatory Visit
Admission: RE | Admit: 2020-11-27 | Discharge: 2020-11-27 | Disposition: A | Discharge status: Home / Self Care | Payer: Medicare HMO | Source: Ambulatory Visit | Attending: Internal Medicine | Admitting: Internal Medicine

## 2020-11-27 VITALS — BP 149/89 | HR 75 | Temp 98.0°F | Resp 16

## 2020-11-27 DIAGNOSIS — R051 Acute cough: Secondary | ICD-10-CM

## 2020-11-27 DIAGNOSIS — J069 Acute upper respiratory infection, unspecified: Secondary | ICD-10-CM

## 2020-11-27 MED ORDER — AMOXICILLIN 875 MG PO TABS: 875.0000 mg | ORAL_TABLET | Freq: Two times a day (BID) | ORAL | 0 refills | Status: AC

## 2020-11-27 NOTE — ED Provider Notes (Signed)
[facility]    CSN: [id number] Arrival date & time: 11/27/20  0847      History   Chief Complaint Chief Complaint  Patient presents with   Cough    HPI Justin [name] is a 70 y.o. male.   Patient presents with nasal congestion and productive cough that has been present for approximately 2 weeks.  Patient reports that cough is productive with yellow sputum.  Denies any known fevers, sick contacts, chest pain, shortness of breath, ear pain, sore throat.  Patient reports that "this is bronchitis and he gets it every year".  Patient denies any chronic lung diseases such as asthma or COPD.  Patient is taking over-the-counter cough medications with minimal improvement in symptoms.   Cough  Past Medical History:  Diagnosis Date   Abnormal echocardiogram 04/03/2017   CAD S/P percutaneous coronary angioplasty 04/02/17 DES to RCA 04/02/2017   Calcium oxalate renal stones    Chewing tobacco use    Gallstones    hx/o, s/p choleycystectomy   H/O echocardiogram 10/2008   no wall thickness, EF 55%, mild mitral regurge   Hyperlipidemia    Hypertension    hx/o, noncompliance prior with medication   Influenza vaccine refused 09/2013   Rheumatic fever    age 58   Stroke ([place]) 10/02/2008   Tinnitus     Patient Active Problem List   Diagnosis Date Noted   Elbow pain, chronic, left 04/05/2019   Hypercholesterolemia 12/11/2017   Coronary artery disease involving native coronary artery of native heart without angina pectoris 12/11/2017   Coronary artery disease involving coronary bypass graft of native heart without angina pectoris 07/07/2017   Ischemic cardiomyopathy 04/10/2017   Abnormal echocardiogram 04/03/2017   CAD S/P percutaneous coronary angioplasty  04/02/2017   Cognitive deficit, post-stroke    Diabetes mellitus type 2 in nonobese (HCC)    Left basal ganglia embolic stroke ([place]) 01/74/9449   Gait disturbance, post-stroke    Occlusion and stenosis of multiple and  bilateral precerebral arteries with cerebral infarction ([place])    Diabetes type 2, controlled ([place]) 02/26/2017   Acute ischemic stroke ([place]) 02/26/2017   Dilated cardiomyopathy ([place])    Chronic combined systolic and diastolic heart failure ([place])    Benign essential HTN    Prediabetes    History of traumatic brain injury    History of CVA (cerebrovascular accident)    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[place])    Medicare annual wellness visit, subsequent 04/01/2016   Impaired fasting blood sugar 04/01/2016   Vaccine refused by patient 04/01/2016   Cramping of feet 04/01/2016   History of kidney stones 02/24/2015   Noncompliance 02/24/2015   Mixed hyperlipidemia 02/24/2015   Essential hypertension 02/24/2015   Routine general medical examination at a health care facility 02/24/2015   [place] of foot 02/24/2015   Paresthesia of left foot 02/24/2015   Advanced directives, counseling/discussion 02/24/2015   Vaccine counseling 02/24/2015   Tobacco chew use 05/06/2011    Past Surgical History:  Procedure Laterality Date   blood clot  1997   brain   [place]   craniotomy, evacuation of hematoma   CHOLECYSTECTOMY  09/06/2011   Procedure: LAPAROSCOPIC CHOLECYSTECTOMY WITH INTRAOPERATIVE CHOLANGIOGRAM;  Surgeon: Earnstine Regal, MD;  Location: [facility];  Service: General;  Laterality: N/A;   COLONOSCOPY  01/04/14   normal. Dr. Owens Loffler   CORONARY STENT INTERVENTION N/A 04/02/2017   Procedure: CORONARY STENT INTERVENTION;  Surgeon: Leonie Man, MD;  Location: [facility]  INVASIVE CV LAB;  Service: Cardiovascular;  Laterality: N/A;   Dental implant     INTRAVASCULAR PRESSURE WIRE/FFR STUDY N/A 04/02/2017   Procedure: INTRAVASCULAR PRESSURE WIRE/FFR STUDY;  Surgeon: Leonie Man, MD;  Location: [facility];  Service: Cardiovascular;  Laterality: N/A;   JOINT REPLACEMENT  2004   LEFT BICEP REPAIR, RTC repair   LEFT HEART CATH AND CORONARY ANGIOGRAPHY N/A 04/02/2017   Procedure: LEFT HEART CATH AND  CORONARY ANGIOGRAPHY;  Surgeon: Leonie Man, MD;  Location: [facility];  Service: Cardiovascular;  Laterality: N/A;   LOOP RECORDER INSERTION N/A 02/27/2017   Procedure: LOOP RECORDER INSERTION;  Surgeon: Sanda Klein, MD;  Location: [facility];  Service: Cardiovascular;  Laterality: N/A;   LOOP RECORDER INSERTION N/A 02/27/2017   Procedure: LOOP RECORDER INSERTION;  Surgeon: Pixie Casino, MD;  Location: [facility];  Service: Cardiovascular;  Laterality: N/A;   ROTATOR CUFF REPAIR     Right   SIGMOIDOSCOPY     TEE WITHOUT CARDIOVERSION N/A 02/27/2017   Procedure: TRANSESOPHAGEAL ECHOCARDIOGRAM (TEE);  Surgeon: Pixie Casino, MD;  Location: [facility];  Service: Cardiovascular;  Laterality: N/A;       Home Medications    Prior to Admission medications   Medication Sig Start Date End Date Taking? Authorizing Provider  amoxicillin (AMOXIL) 875 MG tablet Take 1 tablet (875 mg total) by mouth 2 (two) times daily for 7 days. 11/27/20 12/04/20 Yes Teodora Medici, FNP  aspirin 81 MG chewable tablet Chew 1 tablet (81 mg total) by mouth daily. 04/03/17   Isaiah Serge, NP  atorvastatin (LIPITOR) 40 MG tablet TAKE 1 TABLET BY MOUTH DAILY AT 6 PM. 10/03/20   Croitoru, Mihai, MD  carvedilol (COREG) 6.25 MG tablet TAKE 1 TABLET BY MOUTH TWICE A DAY 02/28/20   Croitoru, Mihai, MD  clopidogrel (PLAVIX) 75 MG tablet TAKE 1 TABLET BY MOUTH EVERY DAY 02/28/20   Croitoru, Mihai, MD  ENTRESTO 97-103 MG TAKE 1 TABLET BY MOUTH TWICE A DAY 02/28/20   Croitoru, Mihai, MD  HYDROcodone-acetaminophen (NORCO/VICODIN) 5-325 MG tablet Take 1 tablet by mouth every 8 (eight) hours as needed for moderate pain. 01/11/20   Silverio Decamp, MD  Multiple Vitamins-Minerals (PRESERVISION AREDS 2) CAPS Take 2 capsules by mouth daily.    [provider]  nitroGLYCERIN (NITROSTAT) 0.4 MG SL tablet Place 1 tablet (0.4 mg total) under the tongue every 5 (five) minutes as needed for chest  pain. 04/03/17   Isaiah Serge, NP    Family History Family History  Problem Relation Age of Onset   Heart disease Mother 21       MI   Migraines Mother    Cirrhosis Brother    Alcohol abuse Father        lived to age 65   Diabetes Brother    Colon cancer Neg Hx    Cancer Neg Hx    Stroke Neg Hx    Hypertension Neg Hx    Hyperlipidemia Neg Hx     Social History Social History   Tobacco Use   Smoking status: Never   Smokeless tobacco: Current    Types: Chew  Vaping Use   Vaping Use: Never used  Substance Use Topics   Alcohol use: No    Alcohol/week: 0.0 standard drinks   Drug use: No     Allergies   Patient has no known allergies.   Review of Systems Review of Systems Per  HPI  Physical Exam Triage Vital Signs ED Triage Vitals [11/27/20 0901]  Enc Vitals Group     BP (!) 149/89     Pulse Rate 75     Resp 16     Temp 98 F (36.7 C)     Temp Source Oral     SpO2 96 %     Weight      Height      Head Circumference      Peak Flow      Pain Score 0     Pain Loc      Pain Edu?      Excl. in [place]?    No data found.  Updated Vital Signs BP (!) 149/89 (BP Location: Left Arm)   Pulse 75   Temp 98 F (36.7 C) (Oral)   Resp 16   SpO2 96%   Visual Acuity Right Eye Distance:   Left Eye Distance:   Bilateral Distance:    Right Eye Near:   Left Eye Near:    Bilateral Near:     Physical Exam Constitutional:      General: He is not in acute distress.    Appearance: Normal appearance. He is not toxic-appearing or diaphoretic.  HENT:     Head: Normocephalic and atraumatic.     Right Ear: Tympanic membrane and ear canal normal.     Left Ear: Tympanic membrane and ear canal normal.     Nose: Congestion present.     Mouth/Throat:     Mouth: Mucous membranes are moist.     Pharynx: No posterior oropharyngeal erythema.  Eyes:     Extraocular Movements: Extraocular movements intact.     Conjunctiva/sclera: Conjunctivae normal.     Pupils: Pupils are  equal, round, and reactive to light.  Cardiovascular:     Rate and Rhythm: Normal rate and regular rhythm.     Pulses: Normal pulses.     Heart sounds: Normal heart sounds.  Pulmonary:     Effort: Pulmonary effort is normal. No respiratory distress.     Breath sounds: Normal breath sounds. No stridor. No wheezing or rhonchi.  Abdominal:     General: Abdomen is flat. Bowel sounds are normal.     Palpations: Abdomen is soft.  Musculoskeletal:        General: Normal range of motion.     Cervical back: Normal range of motion.  Skin:    General: Skin is warm and dry.  Neurological:     General: No focal deficit present.     Mental Status: He is alert and oriented to person, place, and time. Mental status is at baseline.  Psychiatric:        Mood and Affect: Mood normal.        Behavior: Behavior normal.     UC Treatments / Results  Labs (all labs ordered are listed, but only abnormal results are displayed) Labs Reviewed - No data to display  EKG   Radiology No results found.  Procedures Procedures (including critical care time)  Medications Ordered in UC Medications - No data to display  Initial Impression / Assessment and Plan / UC Course  I have reviewed the triage vital signs and the nursing notes.  Pertinent labs & imaging results that were available during my care of the patient were reviewed by me and considered in my medical decision making (see chart for details).     Suggested to chest x-ray to patient due to duration  of cough but patient declined.  Advised patient that appropriate treatment would be steroid and cough medication but patient declined stating that he only wanted an antibiotic because that "normally clears it up".  Patient education was provided.  Will prescribe amoxicillin as patient was offered several other treatment regimens but was only agreeable to amoxicillin.  Discussed strict return precautions.  Patient was agreeable to plan. Final  Clinical Impressions(s) / UC Diagnoses   Final diagnoses:  Acute upper respiratory infection  Acute cough     Discharge Instructions      You have been prescribed amoxicillin antibiotic to help treat your symptoms.  Please follow-up with primary care if symptoms persist.     ED Prescriptions     Medication Sig Dispense Auth. Provider   amoxicillin (AMOXIL) 875 MG tablet Take 1 tablet (875 mg total) by mouth 2 (two) times daily for 7 days. 14 tablet [place], Michele Rockers, [place]      PDMP not reviewed this encounter.   Teodora Medici, [place] 11/27/20 [phone]

## 2020-11-27 NOTE — ED Triage Notes (Signed)
Hx of recurrent bronchitis. States he's been coughing since last week and requesting antibiotics

## 2020-11-27 NOTE — Discharge Instructions (Signed)
You have been prescribed amoxicillin antibiotic to help treat your symptoms.  Please follow-up with primary care if symptoms persist.

## 2020-12-12 ENCOUNTER — Other Ambulatory Visit: Payer: Self-pay

## 2020-12-12 ENCOUNTER — Ambulatory Visit: Payer: Medicare HMO | Admitting: Cardiovascular Disease

## 2020-12-12 ENCOUNTER — Telehealth: Payer: Self-pay | Admitting: *Deleted

## 2020-12-12 ENCOUNTER — Encounter: Payer: Self-pay | Admitting: Cardiovascular Disease

## 2020-12-12 VITALS — BP 130/60 | HR 70 | Ht 66.0 in | Wt 165.2 lb

## 2020-12-12 DIAGNOSIS — I2510 Atherosclerotic heart disease of native coronary artery without angina pectoris: Secondary | ICD-10-CM

## 2020-12-12 DIAGNOSIS — Z8673 Personal history of transient ischemic attack (TIA), and cerebral infarction without residual deficits: Secondary | ICD-10-CM

## 2020-12-12 DIAGNOSIS — Z4509 Encounter for adjustment and management of other cardiac device: Secondary | ICD-10-CM

## 2020-12-12 DIAGNOSIS — E119 Type 2 diabetes mellitus without complications: Secondary | ICD-10-CM

## 2020-12-12 DIAGNOSIS — I5042 Chronic combined systolic (congestive) and diastolic (congestive) heart failure: Secondary | ICD-10-CM

## 2020-12-12 DIAGNOSIS — I10 Essential (primary) hypertension: Secondary | ICD-10-CM | POA: Diagnosis not present

## 2020-12-12 DIAGNOSIS — E7800 Pure hypercholesterolemia, unspecified: Secondary | ICD-10-CM

## 2020-12-12 LAB — COMPREHENSIVE METABOLIC PANEL
ALT: 14 IU/L (ref 0–44)
AST: 15 IU/L (ref 0–40)
Albumin/Globulin Ratio: 1.8 (ref 1.2–2.2)
Albumin: 4.1 g/dL (ref 3.7–4.7)
Alkaline Phosphatase: 100 IU/L (ref 44–121)
BUN/Creatinine Ratio: 16 (ref 10–24)
BUN: 13 mg/dL (ref 8–27)
Bilirubin Total: 0.5 mg/dL (ref 0.0–1.2)
CO2: 25 mmol/L (ref 20–29)
Calcium: 9 mg/dL (ref 8.6–10.2)
Chloride: 103 mmol/L (ref 96–106)
Creatinine, Ser: 0.83 mg/dL (ref 0.76–1.27)
Globulin, Total: 2.3 g/dL (ref 1.5–4.5)
Glucose: 148 mg/dL — ABNORMAL HIGH (ref 70–99)
Potassium: 4.7 mmol/L (ref 3.5–5.2)
Sodium: 138 mmol/L (ref 134–144)
Total Protein: 6.4 g/dL (ref 6.0–8.5)
eGFR: 94 mL/min/{1.73_m2} (ref >59)

## 2020-12-12 LAB — LIPID PANEL
Chol/HDL Ratio: 2.9 ratio (ref 0.0–5.0)
Cholesterol, Total: 114 mg/dL (ref 100–199)
HDL: 39 mg/dL — ABNORMAL LOW (ref >39)
LDL Chol Calc (NIH): 58 mg/dL (ref 0–99)
Triglycerides: 89 mg/dL (ref 0–149)
VLDL Cholesterol Cal: 17 mg/dL (ref 5–40)

## 2020-12-12 LAB — HEMOGLOBIN A1C
Est. average glucose Bld gHb Est-mCnc: 189 mg/dL
Hgb A1c MFr Bld: 8.2 % — ABNORMAL HIGH (ref 4.8–5.6)

## 2020-12-12 NOTE — Patient Instructions (Signed)
Medication Instructions:  No changes *If you need a refill on your cardiac medications before your next appointment, please call your pharmacy*   Lab Work: Your provider would like for you to have the following labs today: fasting lipid, CMET and A1C  If you have labs (blood work) drawn today and your tests are completely normal, you will receive your results only by: [place] (if you have MyChart) OR A paper copy in the mail If you have any lab test that is abnormal or we need to change your treatment, we will call you to review the results.   Testing/Procedures: None ordered   Follow-Up: At [facility], you and your health needs are our priority.  As part of our continuing mission to provide you with exceptional heart care, we have created designated Provider Care Teams.  These Care Teams include your primary Cardiologist (physician) and Advanced Practice Providers (APPs -  Physician Assistants and Nurse Practitioners) who all work together to provide you with the care you need, when you need it.  We recommend signing up for the patient portal called "MyChart".  Sign up information is provided on this After Visit Summary.  MyChart is used to connect with patients for Virtual Visits (Telemedicine).  Patients are able to view lab/test results, encounter notes, upcoming appointments, etc.  Non-urgent messages can be sent to your provider as well.   To learn more about what you can do with MyChart, go to [email or web address].    Your next appointment:   12 month(s)  The format for your next appointment:   In Person  Provider:   Sanda Klein, MD

## 2020-12-12 NOTE — Progress Notes (Signed)
Cardiology Office Note:    Date:  12/12/2020   ID:  Justin [name], DOB 23-Sep-1950, MRN [id number]  PCP:  Heinz Knuckles, MD  Cardiologist:  Sanda Klein, MD   Referring MD: Heinz Knuckles, MD   Chief Complaint  Patient presents with   Congestive Heart Failure   Coronary Artery Disease     History of Present Illness:    Justin [name] is a 70 y.o. male with ischemic cardiomyopathy (NYHA class 1-2, EF 40-45% echo May 2019), painless CAD (DES SIERRA 2.75 X 18 mm, March 4098), embolic stroke (January 1191; left basal ganglia, left parieto-occipital cortex and right cerebellar hemisphere).    He has not had any new syncopal events.  He feels well and denies angina or chest pain at rest or with activity.  He does not have orthopnea, PND or lower extremity edema.  Denies claudication.  Last month he had a several week long course of a respiratory infection treated with amoxicillin finally resolved.  Since then he has been able to do heavy physical work including carrying a heavy ladder to clean his gutters and walking on the roof, without any cardiac symptoms.  Maintaining euvolemic status without diuretics.  Has had great benefit from [place], but is paying a lot of money for it, hard to afford it.  Denies palpitations, orthostatic dizziness or syncope.  No significant arrhythmia on his loop recorder.  His loop recorder has reached end of service and he would like it explanted.  In January 2019 , presented with CVA and he underwent transesophageal echo which did not show embolic source and had a loop recorder placed.  However the echo evaluation showed evidence of moderate left ventricular systolic dysfunction with an ejection fraction of 35%, mild dilation of the left ventricular chamber and regional wall motion abnormalities with akinesis of the entire distribution of the right coronary artery and grade 2 diastolic dysfunction.  The echo abnormalities were new when compared to a study  from 2010. Cath showed CAD and received a DES to proximal RCA March 2019, followed by improvement in LVEF to 40-45%).    Past Medical History:  Diagnosis Date   Abnormal echocardiogram 04/03/2017   CAD S/P percutaneous coronary angioplasty 04/02/17 DES to RCA 04/02/2017   Calcium oxalate renal stones    Chewing tobacco use    Gallstones    hx/o, s/p choleycystectomy   H/O echocardiogram 10/2008   no wall thickness, EF 55%, mild mitral regurge   Hyperlipidemia    Hypertension    hx/o, noncompliance prior with medication   Influenza vaccine refused 09/2013   Rheumatic fever    age 32   Stroke ([place]) 10/02/2008   Tinnitus     Past Surgical History:  Procedure Laterality Date   blood clot  1997   brain   [place]   craniotomy, evacuation of hematoma   CHOLECYSTECTOMY  09/06/2011   Procedure: LAPAROSCOPIC CHOLECYSTECTOMY WITH INTRAOPERATIVE CHOLANGIOGRAM;  Surgeon: Earnstine Regal, MD;  Location: [facility];  Service: General;  Laterality: N/A;   COLONOSCOPY  01/04/14   normal. Dr. Owens Loffler   CORONARY STENT INTERVENTION N/A 04/02/2017   Procedure: CORONARY STENT INTERVENTION;  Surgeon: Leonie Man, MD;  Location: [facility];  Service: Cardiovascular;  Laterality: N/A;   Dental implant     INTRAVASCULAR PRESSURE WIRE/FFR STUDY N/A 04/02/2017   Procedure: INTRAVASCULAR PRESSURE WIRE/FFR STUDY;  Surgeon: Leonie Man, MD;  Location: [facility];  Service:  Cardiovascular;  Laterality: N/A;   JOINT REPLACEMENT  2004   LEFT BICEP REPAIR, RTC repair   LEFT HEART CATH AND CORONARY ANGIOGRAPHY N/A 04/02/2017   Procedure: LEFT HEART CATH AND CORONARY ANGIOGRAPHY;  Surgeon: Leonie Man, MD;  Location: [facility];  Service: Cardiovascular;  Laterality: N/A;   LOOP RECORDER INSERTION N/A 02/27/2017   Procedure: LOOP RECORDER INSERTION;  Surgeon: Sanda Klein, MD;  Location: [facility];  Service: Cardiovascular;  Laterality: N/A;   LOOP RECORDER  INSERTION N/A 02/27/2017   Procedure: LOOP RECORDER INSERTION;  Surgeon: Pixie Casino, MD;  Location: [facility];  Service: Cardiovascular;  Laterality: N/A;   ROTATOR CUFF REPAIR     Right   SIGMOIDOSCOPY     TEE WITHOUT CARDIOVERSION N/A 02/27/2017   Procedure: TRANSESOPHAGEAL ECHOCARDIOGRAM (TEE);  Surgeon: Pixie Casino, MD;  Location: [facility];  Service: Cardiovascular;  Laterality: N/A;    Current Medications: Current Meds  Medication Sig   aspirin 81 MG chewable tablet Chew 1 tablet (81 mg total) by mouth daily.   atorvastatin (LIPITOR) 40 MG tablet TAKE 1 TABLET BY MOUTH DAILY AT 6 PM.   carvedilol (COREG) 6.25 MG tablet TAKE 1 TABLET BY MOUTH TWICE A DAY   clopidogrel (PLAVIX) 75 MG tablet TAKE 1 TABLET BY MOUTH EVERY DAY   ENTRESTO 97-103 MG TAKE 1 TABLET BY MOUTH TWICE A DAY   nitroGLYCERIN (NITROSTAT) 0.4 MG SL tablet Place 1 tablet (0.4 mg total) under the tongue every 5 (five) minutes as needed for chest pain.     Allergies:   Patient has no known allergies.   Social History   Socioeconomic History   Marital status: Single    Spouse name: Not on file   Number of children: 2   Years of education: Not on file   Highest education level: Not on file  Occupational History    Employer: [employer]  Tobacco Use   Smoking status: Never   Smokeless tobacco: Current    Types: Chew  Vaping Use   Vaping Use: Never used  Substance and Sexual Activity   Alcohol use: No    Alcohol/week: 0.0 standard drinks   Drug use: No   Sexual activity: Not on file  Other Topics Concern   Not on file  Social History Narrative   Single, has 2 sons, 11yo and 68yo, electrician, walks regularly, 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     Social Determinants of Health   Financial Resource Strain: Not on file  Food Insecurity: Not on file  Transportation Needs: Not on file  Physical Activity: Not on file  Stress: Not on file  Social Connections: Not on file      Family History: The patient's family history includes Alcohol abuse in his father; Cirrhosis in his brother; Diabetes in his brother; Heart disease (age of onset: 3) in his mother; Migraines in his mother. There is no history of Colon cancer, Cancer, Stroke, Hypertension, or Hyperlipidemia.  ROS:   Please see the history of present illness.    All other systems are reviewed and are negative.   EKGs/Labs/Other Studies Reviewed:    The following studies were reviewed today: Complete implantable loop recorder download from a couple of days ago  EKG:  EKG is ordered today and shows sinus rhythm with fairly frequent PVCs.  The PVCs are monomorphic with a left bundle branch block vertical axis and RS transition in lead V2, consider RVOT PVCs.  No repolarization abnormalities and QTc normal at 442 ms.  Recent Labs: No results found for requested labs within last 8760 hours.   Recent Lipid Panel    Component Value Date/Time   CHOL 211 (H) 02/26/2017 0328   TRIG 153 (H) 02/26/2017 0328   HDL 41 02/26/2017 0328   CHOLHDL 5.1 02/26/2017 0328   VLDL 31 02/26/2017 0328   LDLCALC 139 (H) 02/26/2017 0328   Jun 16, 2017, [facility] Total cholesterol 109, triglycerides 190, HDL 40, LDL 31 March 17, 2017  hemoglobin A1c 5.7%    Physical Exam:    VS:  BP 130/60   Pulse 70   Ht 5\' 6"  (1.676 m)   Wt 165 lb 3.2 oz (74.9 kg)   SpO2 98%   BMI 26.66 kg/m     Wt Readings from Last 3 Encounters:  12/12/20 165 lb 3.2 oz (74.9 kg)  10/07/19 157 lb (71.2 kg)  11/25/18 168 lb 3.2 oz (76.3 kg)     General: Alert, oriented x3, no distress, Head: no evidence of trauma, PERRL, EOMI, no exophtalmos or lid lag, no myxedema, no xanthelasma; normal ears, nose and oropharynx Neck: normal jugular venous pulsations and no hepatojugular reflux; brisk carotid pulses without delay and no carotid bruits Chest: clear to auscultation, no signs of consolidation by percussion or palpation, normal  fremitus, symmetrical and full respiratory excursions Cardiovascular: normal position and quality of the apical impulse, regular rhythm, normal first and second heart sounds, no murmurs, rubs or gallops Abdomen: no tenderness or distention, no masses by palpation, no abnormal pulsatility or arterial bruits, normal bowel sounds, no hepatosplenomegaly Extremities: no clubbing, cyanosis or edema; 2+ radial, ulnar and brachial pulses bilaterally; 2+ right femoral, posterior tibial and dorsalis pedis pulses; 2+ left femoral, posterior tibial and dorsalis pedis pulses; no subclavian or femoral bruits Neurological: grossly nonfocal Psych: Normal mood and affect    ASSESSMENT:    1. Chronic combined systolic and diastolic heart failure ([place])   2. Encounter for loop recorder at end of battery life   3. Coronary artery disease involving native coronary artery of native heart without angina pectoris   4. Essential hypertension   5. Hypercholesterolemia   6. Diabetes mellitus type 2 in nonobese (HCC)   7. History of embolic stroke     PLAN:    In order of problems listed above:  CHF: NYHA functional class I, euvolemic without diuretics.  Last evaluation was an echo in 2019 when EF was 40-45%.  Clinically he is doing better than that. Syncope: Hypotensive syncope probably due to reduced p.o. intake, normal rhythm at the time.  Has not recurred.   Loop recorder at end of service:.  We will schedule for device explantation. CAD: He does not have angina pectoris..  Coronary disease was discovered incidentally during work-up for cardiomyopathy.  Remains asymptomatic.  There is evidence of some permanent injury in the distribution of the right coronary artery, but LVEF has improved following stent placement and optimization of heart failure medications. HTN: Blood pressure in desirable range. HLP: Check lipid profile today. DM: Not had hemoglobin A1c checked since last September.  Suspect it has improved  with weight loss. Embolic CVA: pattern was strongly suggestive of embolic disease with involvement of multiple vascular territories.  He has made excellent neurological recovery.  No atrial fibrillation has been detected during 3 years of loop recorder monitoring.    Medication Adjustments/Labs and Tests Ordered: Current medicines are reviewed at length with the patient  today.  Concerns regarding medicines are outlined above.  Orders Placed This Encounter  Procedures   Comprehensive metabolic panel   Lipid panel   Hemoglobin A1c   EKG 12-Lead    No orders of the defined types were placed in this encounter.  Patient Instructions  Medication Instructions:  No changes *If you need a refill on your cardiac medications before your next appointment, please call your pharmacy*   Lab Work: Your provider would like for you to have the following labs today: fasting lipid, CMET and A1C  If you have labs (blood work) drawn today and your tests are completely normal, you will receive your results only by: [place] (if you have MyChart) OR A paper copy in the mail If you have any lab test that is abnormal or we need to change your treatment, we will call you to review the results.   Testing/Procedures: None ordered   Follow-Up: At [facility], you and your health needs are our priority.  As part of our continuing mission to provide you with exceptional heart care, we have created designated Provider Care Teams.  These Care Teams include your primary Cardiologist (physician) and Advanced Practice Providers (APPs -  Physician Assistants and Nurse Practitioners) who all work together to provide you with the care you need, when you need it.  We recommend signing up for the patient portal called "MyChart".  Sign up information is provided on this After Visit Summary.  MyChart is used to connect with patients for Virtual Visits (Telemedicine).  Patients are able to view lab/test results,  encounter notes, upcoming appointments, etc.  Non-urgent messages can be sent to your provider as well.   To learn more about what you can do with MyChart, go to [email or web address].    Your next appointment:   12 month(s)  The format for your next appointment:   In Person  Provider:   Sanda Klein, MD      Signed, Sanda Klein, MD  12/12/2020 2:24 PM    [place] Patient

## 2020-12-12 NOTE — Telephone Encounter (Signed)
Called and scheduled a loop explant on 11/28 with Dr. Geraldine [facility] at [place], [place]  [place], [place] [address]  Phone: [phone] Fax: [phone]   You are scheduled for a Loop Recorder Explant on 11/28  at 3:30 pm with Dr. [name]. This will take place at [place], [address].    Please arrive at your appointment 15-20 minutes early.   You do not need to be fasting.   The procedure is performed with local anesthesia. You will not receive sedatives nor will an IV be placed.   Wash your chest and neck with the surgical soap the evening before and the morning of your procedure. Please following the washing instructions provided.   As with all surgical implants, there is a small risk of infection. If an infection occurs, the device will be removed. To help reduce the risk, please use the surgical scrub provided. Additional antiseptic precautions will be taken at the time of the procedure.   Please bring your insurance cards.   *Please note that scheduled loop recorder implants may need to be rescheduled if Dr. [name] has a procedure urgently added on at the hospital   Preparing for the Procedure  Before the procedure, you can play an important role. Because skin is not sterile, your skin needs to be as free of germs as possible. You can reduce the number of germs on your skin by washing with CHG (chlorhexidine gluconate) Soap before the procedure. CHG is an antiseptic cleaner which kills germs and bonds with the skin to continue killing germs even after washing.  Please do not use if you have an allergy to CHG or antibacterial soaps. If your skin becomes reddened/irritated, STOP using the CHG.  DO NOT SHAVE (including legs and underarms) for at least 48 hours prior to first CHG shower. It is OK to shave your face.  Please follow these instructions carefully: Shower the night before the procedure and the morning  of with CHG Soap. If you chose to wash your hair, wash your hair first as usual with your normal shampoo/conditioner. After you shampoo/condition, rinse you hair and body thoroughly to remove shampoo/conditioner. Use CHG as you would any other liquid soap. You can apply CHG directly to the skin and wash gently with a loofah or a clean washcloth. Apply the CHG Soap to your body ONLY FROM THE NECK DOWN. Do not use on open wounds or open sores. Avoid contact with your eyes, ears, mouth, and genitals (private parts).  Wash thoroughly, paying special attention to the area where your surgery will be performed. Thoroughly rinse your body with warm water from the neck down. DO NOT shower/wash with your normal soap after using and rinsing off the CHG Soap. Pat yourself dry with a clean towel. Wear clean pajamas to bed. Place clean sheets on your bed the night of your first shower and do not sleep with pets..  Day of Surgery: Shower with the CHG Soap following the instructions listed above. DO NOT apply deodorants or lotions.

## 2020-12-25 ENCOUNTER — Ambulatory Visit: Payer: Medicare HMO | Admitting: Cardiovascular Disease

## 2021-02-21 ENCOUNTER — Other Ambulatory Visit: Payer: Self-pay | Admitting: Cardiovascular Disease

## 2021-03-07 ENCOUNTER — Telehealth: Payer: Self-pay | Admitting: Cardiovascular Disease

## 2021-03-07 MED ORDER — CARVEDILOL 6.25 MG PO TABS: 6.2500 mg | ORAL_TABLET | Freq: Two times a day (BID) | ORAL | 3 refills | Status: DC

## 2021-03-07 MED ORDER — ATORVASTATIN CALCIUM 40 MG PO TABS: ORAL_TABLET | ORAL | 2 refills | Status: DC

## 2021-03-07 MED ORDER — CLOPIDOGREL BISULFATE 75 MG PO TABS: 75.0000 mg | ORAL_TABLET | Freq: Every day | ORAL | 3 refills | Status: DC

## 2021-03-07 NOTE — Telephone Encounter (Signed)
Pt c/o medication issue:  1. Name of Medication:  aspirin 81 MG chewable tablet clopidogrel (PLAVIX) 75 MG tablet  2. How are you currently taking this medication (dosage and times per day)? 1 tablet daily of each   3. Are you having a reaction (difficulty breathing--STAT)? Yes  4. What is your medication issue? Patient is calling wanting to discuss possible changes in medications. He states he has been having issues with constantly being cold for over a year as well as constipation for the past 3 months. He believes this is due to the aspirin or blood thinner or that he takes them together. Please advise.

## 2021-03-07 NOTE — Telephone Encounter (Signed)
Medications have been filled

## 2021-03-07 NOTE — Telephone Encounter (Signed)
Aspirin and Plavix do not cause constipation or cold feeling. Feeling cold may be multifactorial due to his CHF, DM, and CAD and associated perfusion issues. Not sure if he is still taking the Vicodin on his med list but that is his main med that would cause constipation.

## 2021-03-07 NOTE — Telephone Encounter (Signed)
Left message for patient of the pharmacist response.

## 2021-03-07 NOTE — Telephone Encounter (Signed)
°*  STAT* If patient is at the pharmacy, call can be transferred to refill team.   1. Which medications need to be refilled? (please list name of each medication and dose if known)   atorvastatin (LIPITOR) 40 MG tablet  carvedilol (COREG) 6.25 MG tablet clopidogrel (PLAVIX) 75 MG tablet  2. Which pharmacy/location (including street and city if local pharmacy) is medication to be sent to? [id number] - [place], [place] - [address]  3. Do they need a 30 day or 90 day supply?  90 day supply    Only has a one week supply left.

## 2021-08-02 ENCOUNTER — Ambulatory Visit
Admission: EM | Admit: 2021-08-02 | Discharge: 2021-08-02 | Disposition: A | Discharge status: Home / Self Care | Payer: Medicare HMO | Attending: Family Medicine | Admitting: Family Medicine

## 2021-08-02 ENCOUNTER — Ambulatory Visit (INDEPENDENT_AMBULATORY_CARE_PROVIDER_SITE_OTHER): Payer: Medicare HMO

## 2021-08-02 DIAGNOSIS — M79645 Pain in left finger(s): Secondary | ICD-10-CM

## 2021-08-02 NOTE — Discharge Instructions (Addendum)
Your x-ray did not show any broken bones  You can take Tylenol 500 mg--2 every 6 hours as needed for pain  Ice and elevation and rest can help

## 2021-08-02 NOTE — ED Provider Notes (Signed)
Justin [name]    CSN: [id number] Arrival date & time: 08/02/21  0630      History   Chief Complaint Chief Complaint  Patient presents with   Hand Pain    HPI Justin [name] is a 71 y.o. male.    Hand Pain   Here for pain and swelling around his left proximal ring finger.  Yesterday when he was pulling on a heavy object he felt a snap and then had swelling and pain around the proximal phalanx of his left ring finger.  He did not really hit it on anything.  He can flex it but not completely.  He can extend it also.  Past Medical History:  Diagnosis Date   Abnormal echocardiogram 04/03/2017   CAD S/P percutaneous coronary angioplasty 04/02/17 DES to RCA 04/02/2017   Calcium oxalate renal stones    Chewing tobacco use    Gallstones    hx/o, s/p choleycystectomy   H/O echocardiogram 10/2008   no wall thickness, EF 55%, mild mitral regurge   Hyperlipidemia    Hypertension    hx/o, noncompliance prior with medication   Influenza vaccine refused 09/2013   Rheumatic fever    age 71   Stroke ([place]) 10/02/2008   Tinnitus     Patient Active Problem List   Diagnosis Date Noted   Elbow pain, chronic, left 04/05/2019   Hypercholesterolemia 12/11/2017   Coronary artery disease involving native coronary artery of native heart without angina pectoris 12/11/2017   Coronary artery disease involving coronary bypass graft of native heart without angina pectoris 07/07/2017   Ischemic cardiomyopathy 04/10/2017   Abnormal echocardiogram 04/03/2017   CAD S/P percutaneous coronary angioplasty  04/02/2017   Cognitive deficit, post-stroke    Diabetes mellitus type 2 in nonobese (HCC)    Left basal ganglia embolic stroke ([place]) 16/01/930   Gait disturbance, post-stroke    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 ([place])    Diabetes type 2, controlled ([place]) 02/26/2017   Acute ischemic stroke ([place]) 02/26/2017   Dilated cardiomyopathy  ([place])    Chronic combined systolic and diastolic heart failure ([place])    Benign essential HTN    Prediabetes    History of traumatic brain injury    History of CVA (cerebrovascular accident)    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[place])    Medicare annual wellness visit, subsequent 04/01/2016   Impaired fasting blood sugar 04/01/2016   Vaccine refused by patient 04/01/2016   Cramping of feet 04/01/2016   History of kidney stones 02/24/2015   Noncompliance 02/24/2015   Mixed hyperlipidemia 02/24/2015   Essential hypertension 02/24/2015   Routine general medical examination at a health [name] facility 02/24/2015   [place] of foot 02/24/2015   Paresthesia of left foot 02/24/2015   Advanced directives, counseling/discussion 02/24/2015   Vaccine counseling 02/24/2015   Tobacco chew use 05/06/2011    Past Surgical History:  Procedure Laterality Date   blood clot  1997   brain   [place]   craniotomy, evacuation of hematoma   CHOLECYSTECTOMY  09/06/2011   Procedure: LAPAROSCOPIC CHOLECYSTECTOMY WITH INTRAOPERATIVE CHOLANGIOGRAM;  Surgeon: Earnstine Regal, MD;  Location: [facility];  Service: General;  Laterality: N/A;   COLONOSCOPY  01/04/14   normal. Dr. Owens Loffler   CORONARY STENT INTERVENTION N/A 04/02/2017   Procedure: CORONARY STENT INTERVENTION;  Surgeon: Leonie Man, MD;  Location: [place] [facility];  Service: Cardiovascular;  Laterality: N/A;   Dental implant  INTRAVASCULAR PRESSURE WIRE/FFR STUDY N/A 04/02/2017   Procedure: INTRAVASCULAR PRESSURE WIRE/FFR STUDY;  Surgeon: Leonie Man, MD;  Location: [place] [facility];  Service: Cardiovascular;  Laterality: N/A;   JOINT REPLACEMENT  2004   LEFT BICEP REPAIR, RTC repair   LEFT HEART CATH AND CORONARY ANGIOGRAPHY N/A 04/02/2017   Procedure: LEFT HEART CATH AND CORONARY ANGIOGRAPHY;  Surgeon: Leonie Man, MD;  Location: [place] [facility];  Service: Cardiovascular;  Laterality: N/A;   LOOP RECORDER INSERTION N/A 02/27/2017    Procedure: LOOP RECORDER INSERTION;  Surgeon: Sanda Klein, MD;  Location: [facility];  Service: Cardiovascular;  Laterality: N/A;   LOOP RECORDER INSERTION N/A 02/27/2017   Procedure: LOOP RECORDER INSERTION;  Surgeon: Pixie Casino, MD;  Location: [place];  Service: Cardiovascular;  Laterality: N/A;   ROTATOR CUFF REPAIR     Right   SIGMOIDOSCOPY     TEE WITHOUT CARDIOVERSION N/A 02/27/2017   Procedure: TRANSESOPHAGEAL ECHOCARDIOGRAM (TEE);  Surgeon: Pixie Casino, MD;  Location: [facility];  Service: Cardiovascular;  Laterality: N/A;       Home Medications    Prior to Admission medications   Medication Sig Start Date End Date Taking? Authorizing Provider  aspirin 81 MG chewable tablet Chew 1 tablet (81 mg total) by mouth daily. 04/03/17   Isaiah Serge, NP  atorvastatin (LIPITOR) 40 MG tablet TAKE 1 TABLET BY MOUTH DAILY AT 6 PM. 03/07/21   Croitoru, Dani Gobble, MD  carvedilol (COREG) 6.25 MG tablet Take 1 tablet (6.25 mg total) by mouth 2 (two) times daily. 03/07/21   Croitoru, Mihai, MD  clopidogrel (PLAVIX) 75 MG tablet Take 1 tablet (75 mg total) by mouth daily. 03/07/21   Croitoru, Mihai, MD  ENTRESTO 97-103 MG TAKE 1 TABLET BY MOUTH TWICE A DAY 02/21/21   Croitoru, Mihai, MD  HYDROcodone-acetaminophen (NORCO/VICODIN) 5-325 MG tablet Take 1 tablet by mouth every 8 (eight) hours as needed for moderate pain. Patient not taking: Reported on 12/12/2020 01/11/20   Silverio Decamp, MD  Multiple Vitamins-Minerals (PRESERVISION AREDS 2) CAPS Take 2 capsules by mouth daily. Patient not taking: Reported on 12/12/2020    [provider]  nitroGLYCERIN (NITROSTAT) 0.4 MG SL tablet Place 1 tablet (0.4 mg total) under the tongue every 5 (five) minutes as needed for chest pain. 04/03/17   Isaiah Serge, NP    Family History Family History  Problem Relation Age of Onset   Heart disease Mother 97       MI   Migraines Mother    Cirrhosis Brother    Alcohol abuse  Father        lived to age 59   Diabetes Brother    Colon cancer Neg Hx    Cancer Neg Hx    Stroke Neg Hx    Hypertension Neg Hx    Hyperlipidemia Neg Hx     Social History Social History   Tobacco Use   Smoking status: Never   Smokeless tobacco: Current    Types: Chew  Vaping Use   Vaping Use: Never used  Substance Use Topics   Alcohol use: No    Alcohol/week: 0.0 standard drinks of alcohol   Drug use: No     Allergies   Patient has no known allergies.   Review of Systems Review of Systems   Physical Exam Triage Vital Signs ED Triage Vitals  Enc Vitals Group     BP 08/02/21 0847 (!) 150/81  Pulse Rate 08/02/21 0847 64     Resp 08/02/21 0847 18     Temp 08/02/21 0847 97.8 F (36.6 C)     Temp src --      SpO2 08/02/21 0847 98 %     Weight --      Height --      Head Circumference --      Peak Flow --      Pain Score 08/02/21 0846 0     Pain Loc --      Pain Edu? --      Excl. in [place]? --    No data found.  Updated Vital Signs BP (!) 150/81   Pulse 64   Temp 97.8 F (36.6 C)   Resp 18   SpO2 98%   Visual Acuity Right Eye Distance:   Left Eye Distance:   Bilateral Distance:    Right Eye Near:   Left Eye Near:    Bilateral Near:     Physical Exam Vitals reviewed.  Constitutional:      General: He is not in acute distress.    Appearance: He is not toxic-appearing.  Musculoskeletal:     Comments: There is tenderness and some swelling of the tissue around the proximal phalanx of his left ring finger.  He can flex and extend the finger but not completely due to the swelling.  Cap refill distally is normal  Neurological:     Mental Status: He is alert and oriented to person, place, and time.  Psychiatric:        Behavior: Behavior normal.      UC Treatments / Results  Labs (all labs ordered are listed, but only abnormal results are displayed) Labs Reviewed - No data to display  EKG   Radiology DG Finger Ring Left  Result  Date: 08/02/2021 CLINICAL DATA:  Left ring finger injury. Pain and swelling since yesterday. EXAM: LEFT RING FINGER 2+V COMPARISON:  None Available. FINDINGS: Moderate soft tissue swelling proximally, primarily around the proximal interphalangeal joint. No evidence of foreign body or soft tissue emphysema. The bones appear mildly demineralized. There is no evidence of acute fracture, dislocation or focal joint space narrowing. IMPRESSION: Proximal soft tissue swelling without evidence of acute fracture or dislocation. Electronically Signed   By: Richardean Sale M.D.   On: 08/02/2021 09:21    Procedures Procedures (including critical [name] time)  Medications Ordered in UC Medications - No data to display  Initial Impression / Assessment and Plan / UC Course  I have reviewed the triage vital signs and the nursing notes.  Pertinent labs & imaging results that were available during my [name] of the patient were reviewed by me and considered in my medical decision making (see chart for details).     No fracture noted on the x-rays.  I do suspect a tendon rupture and discussed this with the patient.  He is given contact information for hand specialist Final Clinical Impressions(s) / UC Diagnoses   Final diagnoses:  Pain of finger of left hand     Discharge Instructions      Your x-ray did not show any broken bones  You can take Tylenol 500 mg--2 every 6 hours as needed for pain  Ice and elevation and rest can help     ED Prescriptions   None    PDMP not reviewed this encounter.   Barrett Henle, MD 08/02/21 [phone]

## 2021-08-02 NOTE — ED Triage Notes (Addendum)
Patient presents to Urgent Care with complaints of L ring finger injury that occurred when he was installing a air conditioner window unit. Pt st he pulled on it and that's when he felt the pain and noticed the swelling since yesterday at 4 pm . Patient reports swelling at site no otc pain medications

## 2021-12-28 ENCOUNTER — Encounter: Payer: Self-pay | Admitting: Cardiovascular Disease

## 2021-12-28 ENCOUNTER — Ambulatory Visit: Payer: Medicare HMO | Attending: Cardiovascular Disease | Admitting: Cardiovascular Disease

## 2021-12-28 VITALS — BP 140/76 | HR 55 | Ht 66.0 in | Wt 161.8 lb

## 2021-12-28 DIAGNOSIS — E7800 Pure hypercholesterolemia, unspecified: Secondary | ICD-10-CM | POA: Diagnosis not present

## 2021-12-28 DIAGNOSIS — E039 Hypothyroidism, unspecified: Secondary | ICD-10-CM

## 2021-12-28 DIAGNOSIS — I493 Ventricular premature depolarization: Secondary | ICD-10-CM

## 2021-12-28 DIAGNOSIS — I10 Essential (primary) hypertension: Secondary | ICD-10-CM | POA: Diagnosis not present

## 2021-12-28 DIAGNOSIS — I5042 Chronic combined systolic (congestive) and diastolic (congestive) heart failure: Secondary | ICD-10-CM

## 2021-12-28 DIAGNOSIS — Z4509 Encounter for adjustment and management of other cardiac device: Secondary | ICD-10-CM

## 2021-12-28 DIAGNOSIS — I2510 Atherosclerotic heart disease of native coronary artery without angina pectoris: Secondary | ICD-10-CM

## 2021-12-28 DIAGNOSIS — Z8679 Personal history of other diseases of the circulatory system: Secondary | ICD-10-CM

## 2021-12-28 DIAGNOSIS — E119 Type 2 diabetes mellitus without complications: Secondary | ICD-10-CM

## 2021-12-28 NOTE — Progress Notes (Signed)
Cardiology Office Note:    Date:  12/28/2021   ID:  Justin [name], DOB 11/13/1950, MRN [id number]  PCP:  Heinz Knuckles, MD  Cardiologist:  Sanda Klein, MD   Referring MD: Heinz Knuckles, MD   No chief complaint on file.    History of Present Illness:    Justin [name] is a 71 y.o. 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    He has not had any new syncopal events.  He does have a dizzy sensation if he turns his head sideways very fast, but does not have dizziness with changes in body position (does not have symptoms suggesting orthostatic hypotension).  He also gets dizzy and lightheaded if he sees flashing lights from police car or a spinning merry-go-round suggesting a cerebral cause.  Goes for walks frequently.  Covers 1.5 miles in 14-15 minutes without complaints of shortness of breath or chest pain.  Again able to carry a heavy ladder and clean his own gutters.  Has not had falls or injuries.  Denies intermittent claudication.  Has not had lower extremity edema, orthopnea or PND.  One of his biggest complaints is that he feels cold all the time.  He wears extra layer of clothing compared to his life.  Always cold in the bedroom at night.  Stays constipated unless he uses an aloe vera supplement.  No significant arrhythmia on his loop recorder.  His loop recorder has reached end of service and he initially wanted explanted, but has decided against that.  In January 2019 , presented with CVA and he underwent transesophageal echo which did not show embolic source and had a loop recorder placed.  However the echo evaluation showed evidence of moderate left ventricular systolic dysfunction with an ejection fraction of 35%, mild dilation of the left ventricular chamber and regional wall  motion abnormalities with akinesis of the entire distribution of the right coronary artery and grade 2 diastolic dysfunction.  The echo abnormalities were new when compared to a study from 2010. Cath showed CAD and received a DES to proximal RCA March 2019, followed by improvement in LVEF to 40-45%).    Past Medical History:  Diagnosis Date   Abnormal echocardiogram 04/03/2017   CAD S/P percutaneous coronary angioplasty 04/02/17 DES to RCA 04/02/2017   Calcium oxalate renal stones    Chewing tobacco use    Gallstones    hx/o, s/p choleycystectomy   H/O echocardiogram 10/2008   no wall thickness, EF 55%, mild mitral regurge   Hyperlipidemia    Hypertension    hx/o, noncompliance prior with medication   Influenza vaccine refused 09/2013   Rheumatic fever    age 2   Stroke ([place]) 10/02/2008   Tinnitus     Past Surgical History:  Procedure Laterality Date   blood clot  1997   brain   [place]   craniotomy, evacuation of hematoma   CHOLECYSTECTOMY  09/06/2011   Procedure: LAPAROSCOPIC CHOLECYSTECTOMY WITH INTRAOPERATIVE CHOLANGIOGRAM;  Surgeon: Earnstine Regal, MD;  Location: [facility];  Service: General;  Laterality: N/A;   COLONOSCOPY  01/04/14   normal. Dr. Owens Loffler   CORONARY STENT INTERVENTION N/A 04/02/2017   Procedure: CORONARY STENT INTERVENTION;  Surgeon: Leonie Man, MD;  Location: [facility];  Service: Cardiovascular;  Laterality: N/A;  Dental implant     INTRAVASCULAR PRESSURE WIRE/FFR STUDY N/A 04/02/2017   Procedure: INTRAVASCULAR PRESSURE WIRE/FFR STUDY;  Surgeon: Leonie Man, MD;  Location: [facility] [facility];  Service: Cardiovascular;  Laterality: N/A;   JOINT REPLACEMENT  2004   LEFT BICEP REPAIR, RTC repair   LEFT HEART CATH AND CORONARY ANGIOGRAPHY N/A 04/02/2017   Procedure: LEFT HEART CATH AND CORONARY ANGIOGRAPHY;  Surgeon: Leonie Man, MD;  Location: [facility] [facility];  Service: Cardiovascular;  Laterality: N/A;   LOOP RECORDER INSERTION  N/A 02/27/2017   Procedure: LOOP RECORDER INSERTION;  Surgeon: Sanda Klein, MD;  Location: [place] [facility];  Service: Cardiovascular;  Laterality: N/A;   LOOP RECORDER INSERTION N/A 02/27/2017   Procedure: LOOP RECORDER INSERTION;  Surgeon: Pixie Casino, MD;  Location: [place];  Service: Cardiovascular;  Laterality: N/A;   ROTATOR CUFF REPAIR     Right   SIGMOIDOSCOPY     TEE WITHOUT CARDIOVERSION N/A 02/27/2017   Procedure: TRANSESOPHAGEAL ECHOCARDIOGRAM (TEE);  Surgeon: Pixie Casino, MD;  Location: [facility];  Service: Cardiovascular;  Laterality: N/A;    Current Medications: Current Meds  Medication Sig   aspirin 81 MG chewable tablet Chew 1 tablet (81 mg total) by mouth daily.   atorvastatin (LIPITOR) 40 MG tablet TAKE 1 TABLET BY MOUTH DAILY AT 6 PM.   carvedilol (COREG) 6.25 MG tablet Take 1 tablet (6.25 mg total) by mouth 2 (two) times daily.   clopidogrel (PLAVIX) 75 MG tablet Take 1 tablet (75 mg total) by mouth daily.   ENTRESTO 97-103 MG TAKE 1 TABLET BY MOUTH TWICE A DAY     Allergies:   Patient has no known allergies.   Social History   Socioeconomic History   Marital status: Single    Spouse name: Not on file   Number of children: 2   Years of education: Not on file   Highest education level: Not on file  Occupational History    Employer: [employer]  Tobacco Use   Smoking status: Never   Smokeless tobacco: Current    Types: Chew  Vaping Use   Vaping Use: Never used  Substance and Sexual Activity   Alcohol use: No    Alcohol/week: 0.0 standard drinks of alcohol   Drug use: No   Sexual activity: Not on file  Other Topics Concern   Not on file  Social History Narrative   Single, has 2 sons, 49yo and 61yo, electrician, walks regularly, 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     Social Determinants of Health   Financial Resource Strain: Not on file  Food Insecurity: Not on file  Transportation Needs: Not on file  Physical  Activity: Not on file  Stress: Not on file  Social Connections: Not on file     Family History: The patient's family history includes Alcohol abuse in his father; Cirrhosis in his brother; Diabetes in his brother; Heart disease (age of onset: 24) in his mother; Migraines in his mother. There is no history of Colon cancer, Cancer, Stroke, Hypertension, or Hyperlipidemia.  ROS:   Please see the history of present illness.    All other systems are reviewed and are negative.   EKGs/Labs/Other Studies Reviewed:    The following studies were reviewed today: Complete implantable loop recorder download from a couple of days ago  EKG:  EKG is ordered today and shows sinus bradycardia 55 bpm, nonspecific intraventricular conduction delay/incomplete left bundle branch block (QRS duration  106 ms), QTc 430 ms.    Recent Labs: No results found for requested labs within last 365 days.     Latest Ref Rng & Units 12/12/2020    9:50 AM 10/07/2019   10:46 AM 09/02/2017    8:07 AM  BMP  Glucose 70 - 99 mg/dL 148  124  143   BUN 8 - 27 mg/dL '13  14  12   '$ Creatinine 0.76 - 1.27 mg/dL 0.83  0.88  0.87   BUN/Creat Ratio 10 - '24 16  16  14   '$ Sodium 134 - 144 mmol/L 138  140  139   Potassium 3.5 - 5.2 mmol/L 4.7  4.5  4.3   Chloride 96 - 106 mmol/L 103  103  105   CO2 20 - 29 mmol/L '25  24  21   '$ Calcium 8.6 - 10.2 mg/dL 9.0  9.4  9.1      Recent Lipid Panel    Component Value Date/Time   CHOL 114 12/12/2020 0950   TRIG 89 12/12/2020 0950   HDL 39 (L) 12/12/2020 0950   CHOLHDL 2.9 12/12/2020 0950   CHOLHDL 5.1 02/26/2017 0328   VLDL 31 02/26/2017 0328   LDLCALC 58 12/12/2020 0950   Jun 16, 2017, [facility] Total cholesterol 109, triglycerides 190, HDL 40, LDL 31 March 17, 2017  hemoglobin A1c 5.7%   December 12, 2020 HgbA1c 8.2%  Physical Exam:    VS:  BP (!) 140/76 (BP Location: Left Arm, Patient Position: Sitting, Cuff Size: Normal)   Pulse (!) 55   Ht '5\' 6"'$  (1.676 m)   Wt  73.4 kg   SpO2 95%   BMI 26.12 kg/m     Wt Readings from Last 3 Encounters:  12/28/21 73.4 kg  12/12/20 74.9 kg  10/07/19 71.2 kg    General: Alert, oriented x3, no distress Head: no evidence of trauma, PERRL, EOMI, no exophtalmos or lid lag, no myxedema, no xanthelasma; normal ears, nose and oropharynx Neck: normal jugular venous pulsations and no hepatojugular reflux; brisk carotid pulses without delay and no carotid bruits Chest: clear to auscultation, no signs of consolidation by percussion or palpation, normal fremitus, symmetrical and full respiratory excursions Cardiovascular: normal position and quality of the apical impulse, regular rhythm, normal first and second heart sounds, no murmurs, rubs or gallops Abdomen: no tenderness or distention, no masses by palpation, no abnormal pulsatility or arterial bruits, normal bowel sounds, no hepatosplenomegaly Extremities: no clubbing, cyanosis or edema; 2+ radial, ulnar and brachial pulses bilaterally; 2+ right femoral, posterior tibial and dorsalis pedis pulses; 2+ left femoral, posterior tibial and dorsalis pedis pulses; no subclavian or femoral bruits Neurological: grossly nonfocal Psych: Normal mood and affect   ASSESSMENT:    1. Chronic combined systolic and diastolic heart failure ([place])   2. Coronary artery disease involving native coronary artery of native heart without angina pectoris   3. Essential hypertension   4. Hypercholesterolemia   5. Diabetes mellitus type 2 in nonobese (HCC)   6. History of cerebral embolism   7. Encounter for loop recorder at end of battery life      PLAN:    In order of problems listed above:  CHF:  Functional class I on beta-blockers and Entresto.  Resistant to starting other medications.  EF has not been evaluated since 2019 when it was 40-45%.  Will repeat an echocardiogram. CAD: Denies angina pectoris and never had angina.  His coronary disease was discovered incidentally during workup  for cardiomyopathy.  Reassess LV function. HTN: Blood pressure borderline high today, but typically 130/60. HLP: On statin.  Repeat labs today. DM: Markedly elevated hemoglobin A1c of 8.2% last year.  His not taking any glucose lowering medications and unfortunately still eats ice cream and candy.  Reevaluate. Embolic CVA: pattern was strongly suggestive of embolic disease with involvement of multiple vascular territories.  He has made excellent neurological recovery.  No atrial fibrillation has been detected during 3 years of loop recorder monitoring.  He is on chronic dual antiplatelet therapy. RVOT PVCs: Asymptomatic, seen on previous ECG tracings.  Not seen today. Loop recorder at end of service: He has decided against loop explantation. Cold intolerance: Has some of the symptoms suggestive of hyperthyroidism as well.  Bradycardia may be related to carvedilol.  Check TSH.  Medication Adjustments/Labs and Tests Ordered: Current medicines are reviewed at length with the patient today.  Concerns regarding medicines are outlined above.  No orders of the defined types were placed in this encounter.   No orders of the defined types were placed in this encounter.   Patient Instructions  Medication Instructions:   Your physician recommends that you continue on your current medications as directed. Please refer to the Current Medication list given to you today.  *If you need a refill on your cardiac medications before your next appointment, please call your pharmacy*  Lab Work: Your physician recommends that you return for lab work TODAY:  CBC CMP HgA1C FLP TSH If you have labs (blood work) drawn today and your tests are completely normal, you will receive your results only by: [place] (if you have MyChart) OR A paper copy in the mail If you have any lab test that is abnormal or we need to change your treatment, we will call you to review the results.  Testing/Procedures: Your  physician has requested that you have an echocardiogram. Echocardiography is a painless test that uses sound waves to create images of your heart. It provides your doctor with information about the size and shape of your heart and how well your heart's chambers and valves are working. This procedure takes approximately one hour. There are no restrictions for this procedure. Please do NOT wear cologne, perfume, aftershave, or lotions (deodorant is allowed). Please arrive 15 minutes prior to your appointment time.  Follow-Up: At [facility], you and your health needs are our priority.  As part of our continuing mission to provide you with exceptional heart care, we have created designated Provider Care Teams.  These Care Teams include your primary Cardiologist (physician) and Advanced Practice Providers (APPs -  Physician Assistants and Nurse Practitioners) who all work together to provide you with the care you need, when you need it.  We recommend signing up for the patient portal called "MyChart".  Sign up information is provided on this After Visit Summary.  MyChart is used to connect with patients for Virtual Visits (Telemedicine).  Patients are able to view lab/test results, encounter notes, upcoming appointments, etc.  Non-urgent messages can be sent to your provider as well.   To learn more about what you can do with MyChart, go to [email or web address].    Your next appointment:   1 year(s)  The format for your next appointment:   In Person  Provider:   Sanda Klein, MD     Other Instructions   Important Information About Sugar         Signed, Sanda Klein, MD  12/28/2021 10:29 AM  [facility] Patient

## 2021-12-28 NOTE — Patient Instructions (Addendum)
Medication Instructions:   Your physician recommends that you continue on your current medications as directed. Please refer to the Current Medication list given to you today.  *If you need a refill on your cardiac medications before your next appointment, please call your pharmacy*  Lab Work: Your physician recommends that you return for lab work TODAY:  CBC CMP HgA1C FLP TSH If you have labs (blood work) drawn today and your tests are completely normal, you will receive your results only by: [place] (if you have MyChart) OR A paper copy in the mail If you have any lab test that is abnormal or we need to change your treatment, we will call you to review the results.  Testing/Procedures: Your physician has requested that you have an echocardiogram. Echocardiography is a painless test that uses sound waves to create images of your heart. It provides your doctor with information about the size and shape of your heart and how well your heart's chambers and valves are working. This procedure takes approximately one hour. There are no restrictions for this procedure. Please do NOT wear cologne, perfume, aftershave, or lotions (deodorant is allowed). Please arrive 15 minutes prior to your appointment time.  Follow-Up: At [facility], you and your health needs are our priority.  As part of our continuing mission to provide you with exceptional heart care, we have created designated Provider Care Teams.  These Care Teams include your primary Cardiologist (physician) and Advanced Practice Providers (APPs -  Physician Assistants and Nurse Practitioners) who all work together to provide you with the care you need, when you need it.  We recommend signing up for the patient portal called "MyChart".  Sign up information is provided on this After Visit Summary.  MyChart is used to connect with patients for Virtual Visits (Telemedicine).  Patients are able to view lab/test results,  encounter notes, upcoming appointments, etc.  Non-urgent messages can be sent to your provider as well.   To learn more about what you can do with MyChart, go to [email or web address].    Your next appointment:   1 year(s)  The format for your next appointment:   In Person  Provider:   Sanda Klein, MD     Other Instructions  Important Information About Sugar

## 2021-12-29 LAB — HEMOGLOBIN A1C
Est. average glucose Bld gHb Est-mCnc: 171 mg/dL
Hgb A1c MFr Bld: 7.6 % — ABNORMAL HIGH (ref 4.8–5.6)

## 2021-12-29 LAB — COMPREHENSIVE METABOLIC PANEL
ALT: 13 IU/L (ref 0–44)
AST: 14 IU/L (ref 0–40)
Albumin/Globulin Ratio: 2 (ref 1.2–2.2)
Albumin: 4.3 g/dL (ref 3.8–4.8)
Alkaline Phosphatase: 92 IU/L (ref 44–121)
BUN/Creatinine Ratio: 17 (ref 10–24)
BUN: 14 mg/dL (ref 8–27)
Bilirubin Total: 0.5 mg/dL (ref 0.0–1.2)
CO2: 25 mmol/L (ref 20–29)
Calcium: 9 mg/dL (ref 8.6–10.2)
Chloride: 103 mmol/L (ref 96–106)
Creatinine, Ser: 0.83 mg/dL (ref 0.76–1.27)
Globulin, Total: 2.1 g/dL (ref 1.5–4.5)
Glucose: 128 mg/dL — ABNORMAL HIGH (ref 70–99)
Potassium: 4.7 mmol/L (ref 3.5–5.2)
Sodium: 138 mmol/L (ref 134–144)
Total Protein: 6.4 g/dL (ref 6.0–8.5)
eGFR: 93 mL/min/{1.73_m2} (ref >59)

## 2021-12-29 LAB — LIPID PANEL
Chol/HDL Ratio: 2.6 ratio (ref 0.0–5.0)
Cholesterol, Total: 127 mg/dL (ref 100–199)
HDL: 49 mg/dL (ref >39)
LDL Chol Calc (NIH): 64 mg/dL (ref 0–99)
Triglycerides: 65 mg/dL (ref 0–149)
VLDL Cholesterol Cal: 14 mg/dL (ref 5–40)

## 2021-12-29 LAB — TSH: TSH: 2.09 u[IU]/mL (ref 0.450–4.500)

## 2021-12-29 LAB — CBC
Hematocrit: 40.3 % (ref 37.5–51.0)
Hemoglobin: 13.2 g/dL (ref 13.0–17.7)
MCH: 28.4 pg (ref 26.6–33.0)
MCHC: 32.8 g/dL (ref 31.5–35.7)
MCV: 87 fL (ref 79–97)
Platelets: 195 10*3/uL (ref 150–450)
RBC: 4.64 x10E6/uL (ref 4.14–5.80)
RDW: 14 % (ref 11.6–15.4)
WBC: 4.5 10*3/uL (ref 3.4–10.8)

## 2021-12-31 ENCOUNTER — Telehealth: Payer: Self-pay | Admitting: Cardiovascular Disease

## 2021-12-31 ENCOUNTER — Other Ambulatory Visit: Payer: Self-pay

## 2021-12-31 MED ORDER — METFORMIN HCL 500 MG PO TABS: 500.0000 mg | ORAL_TABLET | Freq: Two times a day (BID) | ORAL | 3 refills | Status: DC

## 2021-12-31 NOTE — Telephone Encounter (Signed)
Called patient left message on personal voice mail to call back. 

## 2021-12-31 NOTE — Telephone Encounter (Signed)
Left a message for the patient to call back.  

## 2021-12-31 NOTE — Telephone Encounter (Signed)
Spoke to patient lab results given.Advised to start Metformin 500 mg twice a day.

## 2021-12-31 NOTE — Telephone Encounter (Signed)
Pt is returning call. Transferred to [place], [place].

## 2021-12-31 NOTE — Telephone Encounter (Signed)
Patient is returning call to discuss lab results. 

## 2022-01-18 ENCOUNTER — Ambulatory Visit (HOSPITAL_COMMUNITY): Payer: Medicare HMO | Attending: Cardiology

## 2022-01-18 DIAGNOSIS — I5042 Chronic combined systolic (congestive) and diastolic (congestive) heart failure: Secondary | ICD-10-CM | POA: Diagnosis present

## 2022-01-18 LAB — ECHOCARDIOGRAM COMPLETE
Area-P 1/2: 3.47 cm2
S' Lateral: 4.7 cm

## 2022-01-18 MED ORDER — PERFLUTREN LIPID MICROSPHERE
1.0000 mL | INTRAVENOUS | Status: AC | PRN
  Administered 2022-01-18: 2 mL via INTRAVENOUS

## 2022-02-01 ENCOUNTER — Telehealth: Payer: Self-pay

## 2022-02-01 ENCOUNTER — Other Ambulatory Visit: Payer: Self-pay

## 2022-02-01 DIAGNOSIS — I2510 Atherosclerotic heart disease of native coronary artery without angina pectoris: Secondary | ICD-10-CM

## 2022-02-01 DIAGNOSIS — I5042 Chronic combined systolic (congestive) and diastolic (congestive) heart failure: Secondary | ICD-10-CM

## 2022-02-01 DIAGNOSIS — I420 Dilated cardiomyopathy: Secondary | ICD-10-CM

## 2022-02-01 DIAGNOSIS — R9430 Abnormal result of cardiovascular function study, unspecified: Secondary | ICD-10-CM

## 2022-02-01 NOTE — Telephone Encounter (Signed)
How to Prepare for Your Cardiac PET/CT Stress Test:  1. Please do not take these medications before your test:   Medications that may interfere with the cardiac pharmacological stress agent (ex. nitrates - including erectile dysfunction medications, isosorbide mononitrate or beta-blockers) CARVEDILOL the day of the exam. (Erectile dysfunction medication should be held for at least 72 hrs prior to test) Theophylline containing medications for 12 hours. Dipyridamole 48 hours prior to the test. Your remaining medications may be taken with water.  2. Nothing to eat or drink, except water, 3 hours prior to arrival time.   NO caffeine/decaffeinated products, or chocolate 12 hours prior to arrival.  3. NO perfume, cologne or lotion  4. Total time is 1 to 2 hours; you may want to bring reading material for the waiting time.  5. Please report to Admitting at the [facility] Main Entrance 30 minutes early for your test.  [place], [place] [address]  Diabetic Preparation:  Hold oral medications. Metformin You may take NPH and Lantus insulin. Do not take Humalog or Humulin R (Regular Insulin) the day of your test. Check blood sugars prior to leaving the house. If able to eat breakfast prior to 3 hour fasting, you may take all medications, including your insulin, Do not worry if you miss your breakfast dose of insulin - start at your next meal.    In preparation for your appointment, medication and supplies will be purchased.  Appointment availability is limited, so if you need to cancel or reschedule, please call the Radiology Department at [phone]  24 hours in advance to avoid a cancellation fee of $100.00  What to Expect After you Arrive:  Once you arrive and check in for your appointment, you will be taken to a preparation room within the Radiology Department.  A technologist or Nurse will obtain your medical history, verify that you are correctly prepped for  the exam, and explain the procedure.  Afterwards,  an IV will be started in your arm and electrodes will be placed on your skin for EKG monitoring during the stress portion of the exam. Then you will be escorted to the PET/CT scanner.  There, staff will get you positioned on the scanner and obtain a blood pressure and EKG.  During the exam, you will continue to be connected to the EKG and blood pressure machines.  A small, safe amount of a radioactive tracer will be injected in your IV to obtain a series of pictures of your heart along with an injection of a stress agent.    After your Exam:  It is recommended that you eat a meal and drink a caffeinated beverage to counter act any effects of the stress agent.  Drink plenty of fluids for the remainder of the day and urinate frequently for the first couple of hours after the exam.  Your doctor will inform you of your test results within 7-10 business days.  For questions about your test or how to prepare for your test, please call: Marchia Bond, Cardiac Imaging Nurse Navigator  Gordy Clement, Cardiac Imaging Nurse Navigator Office: [phone]

## 2022-02-01 NOTE — Telephone Encounter (Signed)
See notes on echo report 01/18/2022.

## 2022-02-19 ENCOUNTER — Telehealth: Payer: Self-pay | Admitting: Cardiovascular Disease

## 2022-02-19 NOTE — Telephone Encounter (Signed)
Patient calling to speak with the nurse, what's to talk bout cancel the the CT. Please advise

## 2022-02-19 NOTE — Telephone Encounter (Signed)
Called Justin [name], advised that his CARDIAC PET scan was scheduled for 02/06.   He states he needed to cancel, as his GF was having some surgery- he states that he would like to be there with her to drive her home. I did advise that per the ECHO results from Dr.Croitoru it was important to get it rescheduled if we cancelled it:  Sanda Klein, MD 01/18/2022 11:30 AM EST     Reviewed the echo images myself and I think that although heart pumping function was not quite as severely reduced as the report says (EF 25-30%), there is clearly a reduction in heart pumping strength compared with 2019 (when the EF was reported at 40-45%).  In my opinion the current EF is around 35%.  It is very important that we continue him on heart failure medications like the Entresto and the carvedilol. I would also like him to undergo a test to see if there is any sign of new coronary blockages to explain the reduction in heart pumping strength.  I do not think he needs a heart catheterization at this point, but would like for him to have a PET scan - please order for CAD and ischemic cardiomyopathy.   Please explain that there may be some delay due to a scheduling backlog with this test.  Not urgent as long as he is not having chest discomfort.    Patient states he agreed this was important, and he decided to keep the appointment. I did advise with him again I did not mind asking them to move it, I just wanted to make sure he was scheduled per Dr.Croitoru's request. Patient is not currently having any symptoms- which we discussed per result this was not urgent, but he states he would keep it to have himself checked out.   Patient was thankful for the call back and discussion.

## 2022-03-04 ENCOUNTER — Telehealth (HOSPITAL_COMMUNITY): Payer: Self-pay | Admitting: Emergency Medicine

## 2022-03-04 NOTE — Telephone Encounter (Signed)
Reaching out to patient to offer assistance regarding upcoming cardiac imaging study; pt verbalizes understanding of appt date/time, parking situation and where to check in, pre-test NPO status and medications ordered, and verified current allergies; name and call back number provided for further questions should they arise Marchia Bond RN Navigator Cardiac Imaging [facility] [phone] office [phone] cell  Arrival [address]  No caffeine No food Holding coreg, metformin, nitro

## 2022-03-05 ENCOUNTER — Encounter (HOSPITAL_COMMUNITY)
Admission: RE | Admit: 2022-03-05 | Discharge: 2022-03-05 | Disposition: A | Discharge status: Home / Self Care | Payer: Medicare HMO | Source: Ambulatory Visit | Attending: Cardiovascular Disease | Admitting: Cardiovascular Disease

## 2022-03-05 DIAGNOSIS — R9430 Abnormal result of cardiovascular function study, unspecified: Secondary | ICD-10-CM | POA: Diagnosis not present

## 2022-03-05 DIAGNOSIS — I420 Dilated cardiomyopathy: Secondary | ICD-10-CM | POA: Diagnosis present

## 2022-03-05 DIAGNOSIS — I2510 Atherosclerotic heart disease of native coronary artery without angina pectoris: Secondary | ICD-10-CM | POA: Diagnosis not present

## 2022-03-05 MED ORDER — RUBIDIUM RB82 GENERATOR (RUBYFILL)
25.0000 | PACK | Freq: Once | INTRAVENOUS | Status: AC
  Administered 2022-03-05: 18.8 via INTRAVENOUS

## 2022-03-05 MED ORDER — REGADENOSON 0.4 MG/5ML IV SOLN
INTRAVENOUS | Status: AC
  Filled 2022-03-05: qty 5

## 2022-03-05 MED ORDER — REGADENOSON 0.4 MG/5ML IV SOLN
0.4000 mg | Freq: Once | INTRAVENOUS | Status: AC
  Administered 2022-03-05: 0.4 mg via INTRAVENOUS

## 2022-03-05 MED ORDER — RUBIDIUM RB82 GENERATOR (RUBYFILL)
25.0000 | PACK | Freq: Once | INTRAVENOUS | Status: AC
  Administered 2022-03-05: 19 via INTRAVENOUS

## 2022-03-06 LAB — NM PET CT CARDIAC PERFUSION MULTI W/ABSOLUTE BLOODFLOW
LV dias vol: 134 mL (ref 62–150)
LV sys vol: 95 mL
MBFR: 3
Nuc Rest EF: 29 %
Nuc Stress EF: 29 %
Peak HR: 87 {beats}/min
Rest HR: 76 {beats}/min
Rest MBF: 0.56 ml/g/min
Rest Nuclear Isotope Dose: 18.8 mCi
ST Depression (mm): 0 mm
Stress MBF: 1.68 ml/g/min
Stress Nuclear Isotope Dose: 19 mCi
TID: 1.11

## 2022-03-09 ENCOUNTER — Other Ambulatory Visit: Payer: Self-pay | Admitting: Cardiovascular Disease

## 2022-03-12 ENCOUNTER — Telehealth: Payer: Self-pay | Admitting: Cardiovascular Disease

## 2022-03-12 NOTE — Telephone Encounter (Signed)
Patient calling for results to his PET scan.

## 2022-03-12 NOTE — Telephone Encounter (Signed)
Gave pt PET scan results per DR. Croitoru. Appointment made with E, Monge for 2/16.

## 2022-03-13 ENCOUNTER — Other Ambulatory Visit: Payer: Self-pay | Admitting: Cardiovascular Disease

## 2022-03-14 ENCOUNTER — Telehealth: Payer: Self-pay | Admitting: Cardiovascular Disease

## 2022-03-14 NOTE — Telephone Encounter (Signed)
*  STAT* If patient is at the pharmacy, call can be transferred to refill team.   1. Which medications need to be refilled? (please list name of each medication and dose if known)   ENTRESTO 97-103 MG    2. Which pharmacy/location (including street and city if local pharmacy) is medication to be sent to?  [id number] [place], [place] [address]    3. Do they need a 30 day or 90 day supply? [address]

## 2022-03-15 ENCOUNTER — Ambulatory Visit: Payer: Medicare HMO | Admitting: Nurse Practitioner

## 2022-03-15 ENCOUNTER — Encounter: Payer: Self-pay | Admitting: Nurse Practitioner

## 2022-03-15 ENCOUNTER — Ambulatory Visit: Payer: Medicare HMO | Attending: Nurse Practitioner | Admitting: Nurse Practitioner

## 2022-03-15 VITALS — BP 128/80 | HR 59 | Ht 66.0 in | Wt 163.0 lb

## 2022-03-15 DIAGNOSIS — I2510 Atherosclerotic heart disease of native coronary artery without angina pectoris: Secondary | ICD-10-CM | POA: Diagnosis not present

## 2022-03-15 DIAGNOSIS — Z8679 Personal history of other diseases of the circulatory system: Secondary | ICD-10-CM

## 2022-03-15 DIAGNOSIS — I255 Ischemic cardiomyopathy: Secondary | ICD-10-CM

## 2022-03-15 DIAGNOSIS — I10 Essential (primary) hypertension: Secondary | ICD-10-CM

## 2022-03-15 DIAGNOSIS — I5042 Chronic combined systolic (congestive) and diastolic (congestive) heart failure: Secondary | ICD-10-CM

## 2022-03-15 DIAGNOSIS — E785 Hyperlipidemia, unspecified: Secondary | ICD-10-CM

## 2022-03-15 DIAGNOSIS — E119 Type 2 diabetes mellitus without complications: Secondary | ICD-10-CM

## 2022-03-15 DIAGNOSIS — I493 Ventricular premature depolarization: Secondary | ICD-10-CM

## 2022-03-15 NOTE — Patient Instructions (Signed)
Medication Instructions:  Your physician recommends that you continue on your current medications as directed. Please refer to the Current Medication list given to you today.   *If you need a refill on your cardiac medications before your next appointment, please call your pharmacy*   Lab Work: NONE ordered at this time of appointment   If you have labs (blood work) drawn today and your tests are completely normal, you will receive your results only by: [place] (if you have MyChart) OR A paper copy in the mail If you have any lab test that is abnormal or we need to change your treatment, we will call you to review the results.   Testing/Procedures: NONE ordered at this time of appointment     Follow-Up: At [facility], you and your health needs are our priority.  As part of our continuing mission to provide you with exceptional heart care, we have created designated Provider Care Teams.  These Care Teams include your primary Cardiologist (physician) and Advanced Practice Providers (APPs -  Physician Assistants and Nurse Practitioners) who all work together to provide you with the care you need, when you need it.  We recommend signing up for the patient portal called "MyChart".  Sign up information is provided on this After Visit Summary.  MyChart is used to connect with patients for Virtual Visits (Telemedicine).  Patients are able to view lab/test results, encounter notes, upcoming appointments, etc.  Non-urgent messages can be sent to your provider as well.   To learn more about what you can do with MyChart, go to [email or web address].    Your next appointment:   6 month(s)  Provider:   Sanda Klein, MD     Other Instructions

## 2022-03-15 NOTE — Progress Notes (Unsigned)
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   Past Medical History    Past Medical History:  Diagnosis Date   Abnormal echocardiogram 04/03/2017   CAD S/P percutaneous coronary angioplasty 04/02/17 DES to RCA 04/02/2017   Calcium oxalate renal stones    Chewing tobacco use    Gallstones    hx/o, s/p choleycystectomy   H/O echocardiogram 10/2008   no wall thickness, EF 55%, mild mitral regurge   Hyperlipidemia    Hypertension    hx/o, noncompliance prior with medication   Influenza vaccine refused 09/2013   Rheumatic fever    age 1   Stroke ([place]) 10/02/2008   Tinnitus    Past Surgical History:  Procedure Laterality Date   blood clot  1997   brain   [place]   craniotomy, evacuation of hematoma   CHOLECYSTECTOMY  09/06/2011   Procedure: LAPAROSCOPIC CHOLECYSTECTOMY WITH INTRAOPERATIVE CHOLANGIOGRAM;  Surgeon: Earnstine Regal, MD;  Location: [facility];  Service: General;  Laterality: N/A;   COLONOSCOPY  01/04/14   normal. Dr. Owens Loffler   CORONARY STENT INTERVENTION N/A 04/02/2017   Procedure: CORONARY STENT INTERVENTION;  Surgeon: Leonie Man, MD;  Location: [place] [facility];  Service: Cardiovascular;  Laterality: N/A;   Dental implant     INTRAVASCULAR PRESSURE WIRE/FFR STUDY N/A 04/02/2017   Procedure: INTRAVASCULAR PRESSURE WIRE/FFR STUDY;  Surgeon: Leonie Man, MD;  Location: [place] [facility];  Service: Cardiovascular;   Laterality: N/A;   JOINT REPLACEMENT  2004   LEFT BICEP REPAIR, RTC repair   LEFT HEART CATH AND CORONARY ANGIOGRAPHY N/A 04/02/2017   Procedure: LEFT HEART CATH AND CORONARY ANGIOGRAPHY;  Surgeon: Leonie Man, MD;  Location: [place] [facility];  Service: Cardiovascular;  Laterality: N/A;   LOOP RECORDER INSERTION N/A 02/27/2017   Procedure: LOOP RECORDER INSERTION;  Surgeon: Sanda Klein, MD;  Location: [place] [facility];  Service: Cardiovascular;  Laterality: N/A;   LOOP RECORDER INSERTION N/A 02/27/2017   Procedure: LOOP RECORDER INSERTION;  Surgeon: Pixie Casino, MD;  Location: [place];  Service: Cardiovascular;  Laterality: N/A;   ROTATOR CUFF REPAIR     Right   SIGMOIDOSCOPY     TEE WITHOUT CARDIOVERSION N/A 02/27/2017   Procedure: TRANSESOPHAGEAL ECHOCARDIOGRAM (TEE);  Surgeon: Pixie Casino, MD;  Location: [facility];  Service: Cardiovascular;  Laterality: N/A;    Allergies  No Known Allergies   Labs/Other Studies Reviewed    The following studies were reviewed today: Cardiac PET stress test 02/2022: Narrative & Impression     The study is normal. Findings are consistent with no ischemia and no infarction. The study is overall low risk. Pertinent findings include: frequent PVCs, and EF does not augment with stress.   Rest ECG rhythm shows normal sinus rhythm. The ECG shows premature ventricular contractions in bigeminy.   Stress ECG: No ST deviation was noted. Arrhythmias during stress: frequent PVCs. Arrhythmias during recovery: frequent PVCs.   LV perfusion  is normal. There is no evidence of ischemia. There is no evidence of infarction.   Rest left ventricular function is abnormal. Rest global function is severely reduced. Rest EF: 29 %. Stress left ventricular function is abnormal. Stress global function is severely reduced. Stress EF: 29 %. End diastolic cavity size is mildly enlarged. End systolic cavity size is mildly enlarged. No evidence of transient  ischemic dilation (TID) noted.   Myocardial blood flow was computed to be 0.72m/g/min at rest and 1.664mg/min at stress. Global myocardial blood flow reserve was 3.00 and was normal.   Coronary calcium assessment not performed due to prior revascularization.   Electronically signed by: GaElouise MunroeMD   CLINICAL DATA:  This over-read does not include interpretation of cardiac or coronary anatomy or pathology. The cardiac PET-CT interpretation by the cardiologist is attached.   COMPARISON:  None Available.   FINDINGS: Atherosclerotic calcifications in the thoracic aorta. Within the visualized portions of the thorax there are no suspicious appearing pulmonary nodules or masses, there is no acute consolidative airspace disease, no pleural effusions, no pneumothorax and no lymphadenopathy. Visualized portions of the upper abdomen demonstrates surgical clips from prior cholecystectomy. There are no aggressive appearing lytic or blastic lesions noted in the visualized portions of the skeleton.   IMPRESSION: 1.  Aortic Atherosclerosis (ICD10-I70.0).  Echo 12/2021: IMPRESSIONS    1. Global hypokinesis with akinesis of the basal inferior and  inferolateral walls; overall severe LV dysfunction.   2. Left ventricular ejection fraction, by estimation, is 25 to 30%. The  left ventricle has severely decreased function. The left ventricle  demonstrates regional wall motion abnormalities (see scoring  diagram/findings for description). The left  ventricular internal cavity size was mildly dilated. Left ventricular  diastolic parameters are consistent with Grade I diastolic dysfunction  (impaired relaxation).   3. Right ventricular systolic function is normal. The right ventricular  size is normal.   4. Left atrial size was mildly dilated.   5. The mitral valve is normal in structure. Mild mitral valve  regurgitation. No evidence of mitral stenosis.   6. The aortic valve is tricuspid.  Aortic valve regurgitation is not  visualized. Aortic valve sclerosis is present, with no evidence of aortic  valve stenosis.   7. The inferior vena cava is normal in size with greater than 50%  respiratory variability, suggesting right atrial pressure of 3 mmHg.   LHC 03/2017: The left ventricular systolic function is normal. Prox RCA-1 lesion is 70% stenosed. Prox RCA-2 lesion is 50% stenosed. -- FFR 0.8 A drug-eluting stent was successfully placed using a STENT SIERRA 2.75 X 18 MM. Post intervention, there is a 0% residual stenosis. Dist RCA lesion is 20% stenosed. Mid LM to Dist LM lesion is 30% stenosed. Prox Cx lesion is 40% stenosed. LV end diastolic pressure is mildly elevated.   FFR + pRCA lesion treated with single DES --> otherwise essentially diffuse mild CAD elsewhere with 30% LM. Mildly elevated LVEDP.   Plan: Overnight observation and post procedure unit with TR band removal. Recommend 1 year of DAPT, but would be okay to stop aspirin after 6 months if clinically indicated. Continue aggressive risk factor modification with beta-blocker, statin and ARB/ACE I   Anticipate discharge tomorrow. Follow-up with Dr. CrSallyanne Kuster  Recent Labs: 12/28/2021: ALT 13; BUN 14; Creatinine, Ser 0.83; Hemoglobin 13.2; Platelets 195; Potassium 4.7; Sodium 138; TSH 2.090  Recent Lipid Panel    Component Value Date/Time   CHOL 127 12/28/2021 1055  TRIG 65 12/28/2021 1055   HDL 49 12/28/2021 1055   CHOLHDL 2.6 12/28/2021 1055   CHOLHDL 5.1 02/26/2017 0328   VLDL 31 02/26/2017 0328   LDLCALC 64 12/28/2021 1055    History of Present Illness    72 year old male with the above past medical history including CAD s/p DES-P RCA in 2019, ICM, chronic combined systolic and diastolic heart failure, asymptomatic RVOT PVCs, embolic stroke in [id number] (left basal ganglia, left parietal occipital cortex and right cerebellar hemisphere) s/p ILR, hypertension, hyperlipidemia, 2 diabetes, cold  intolerance, and chewing tobacco use.  He was hospitalized in January 2019 in the setting of CVA.  TEE did not show embolic source.  He underwent ILR placement which has since reached expiration (patient declined explantation).  No evidence of atrial fibrillation.  Echocardiogram at the time showed evidence of moderate LV systolic dysfunction with a EF 35%, mild dilation of left ventricle chamber and regional wall motion abnormalities with akinesis of the entire distribution of the right coronary artery, G2 DD.  Follow-up cardiac catheterization revealed FFR positive proximal RCA lesion s/p DES, otherwise mild nonobstructive CAD in the LM and LCx arteries. EF subsequently improved to 40 to 45%.  He was last seen in the office on 12/28/2021 and was stable overall from a cardiac standpoint.  He denied symptoms concerning for angina.  Repeat echocardiogram in 01/16/2022 showed EF 25 to 30%, severely decreased LV function, RWMA, mildly dilated LV internal cavity, G1 DD, normal RV systolic function, mild mitral valve regurgitation. Cardiac PET stress test in 02/2022 was low risk, no evidence of ischemia or infarction.  Further escalation of GDMT with consideration of SGLT2 inhibitor was recommended.  He presents today for follow-up.  Since his last visit  Accompanied by his wife.  Since his last visit he has done well from a cardiac standpoint.  He denies symptoms concerning for angina.  Since his stress test he has noted more fatigue, he did get out of breath when climbing the stairs on several occasions.  However, he worked with a Risk manager and played golf this week without any symptoms.  He declines further escalation of GDMT.  Overall, he reports feeling well.  He is relieved to hear the results of his stress test.  Follow-up in 6 months.  He has had significant diarrhea with metformin and plans to stop taking the medication.  ICM/chronic combined systolic and diastolic heart failure: CAD: Asymptomatic  PVCs: Hypertension: Hyperlipidemia: History of CVA: Type 2 diabetes: Disposition:  Home Medications    Current Outpatient Medications  Medication Sig Dispense Refill   aspirin 81 MG chewable tablet Chew 1 tablet (81 mg total) by mouth daily.     atorvastatin (LIPITOR) 40 MG tablet TAKE 1 TABLET BY MOUTH DAILY AT 6 PM. 90 tablet 2   carvedilol (COREG) 6.25 MG tablet TAKE 1 TABLET BY MOUTH TWICE A DAY 180 tablet 3   clopidogrel (PLAVIX) 75 MG tablet TAKE 1 TABLET BY MOUTH EVERY DAY 90 tablet 3   metFORMIN (GLUCOPHAGE) 500 MG tablet Take 1 tablet (500 mg total) by mouth 2 (two) times daily with a meal. 180 tablet 3   nitroGLYCERIN (NITROSTAT) 0.4 MG SL tablet Place 1 tablet (0.4 mg total) under the tongue every 5 (five) minutes as needed for chest pain. 25 tablet 4   sacubitril-valsartan (ENTRESTO) 97-103 MG TAKE 1 TABLET BY MOUTH TWICE A DAY 180 tablet 1   No current facility-administered medications for this visit.     Review of Systems    ***.  All other systems reviewed and are otherwise negative except as noted above.    Physical Exam    VS:  BP 128/80   Pulse (!) 59   Ht 5' 6"$  (1.676 m)   Wt 163 lb (73.9 kg)   SpO2 100%   BMI 26.31 kg/m  , BMI Body mass index is 26.31 kg/m.     GEN: Well nourished, well developed, in no acute distress. HEENT: normal. Neck: Supple, no JVD, carotid bruits, or masses. Cardiac: RRR, no murmurs, rubs, or gallops. No clubbing, cyanosis, edema.  Radials/DP/PT 2+ and equal bilaterally.  Respiratory:  Respirations regular and unlabored, clear to auscultation bilaterally. GI: Soft, nontender, nondistended, BS + x 4. MS: no deformity or atrophy. Skin: warm and dry, no rash. Neuro:  Strength and sensation are intact. Psych: Normal affect.  Accessory Clinical Findings    ECG personally reviewed by me today -no EKG in office today.  Lab Results  Component Value Date   WBC 4.5 12/28/2021   HGB 13.2 12/28/2021   HCT 40.3 12/28/2021   MCV  87 12/28/2021   PLT 195 12/28/2021   Lab Results  Component Value Date   CREATININE 0.83 12/28/2021   BUN 14 12/28/2021   NA 138 12/28/2021   K 4.7 12/28/2021   CL 103 12/28/2021   CO2 25 12/28/2021   Lab Results  Component Value Date   ALT 13 12/28/2021   AST 14 12/28/2021   ALKPHOS 92 12/28/2021   BILITOT 0.5 12/28/2021   Lab Results  Component Value Date   CHOL 127 12/28/2021   HDL 49 12/28/2021   LDLCALC 64 12/28/2021   TRIG 65 12/28/2021   CHOLHDL 2.6 12/28/2021    Lab Results  Component Value Date   HGBA1C 7.6 (H) 12/28/2021    Assessment & Plan    1.  ***      Lenna Sciara, NP 03/15/2022, 4:28 PM

## 2022-03-17 ENCOUNTER — Encounter: Payer: Self-pay | Admitting: Nurse Practitioner

## 2022-03-18 ENCOUNTER — Telehealth: Payer: Self-pay | Admitting: Cardiovascular Disease

## 2022-03-18 ENCOUNTER — Other Ambulatory Visit: Payer: Self-pay

## 2022-03-18 MED ORDER — ATORVASTATIN CALCIUM 40 MG PO TABS: ORAL_TABLET | ORAL | 1 refills | Status: DC

## 2022-03-18 NOTE — Telephone Encounter (Signed)
*  STAT* If patient is at the pharmacy, call can be transferred to refill team.   1. Which medications need to be refilled? (please list name of each medication and dose if known)  atorvastatin (LIPITOR) 40 MG tablet   2. Which pharmacy/location (including street and city if local pharmacy) is medication to be sent to? [id number] [place] [place] [address]   3. Do they need a 30 day or 90 day supply? [address]

## 2022-04-05 ENCOUNTER — Ambulatory Visit: Payer: Medicare HMO | Admitting: Cardiovascular Disease

## 2022-09-03 ENCOUNTER — Other Ambulatory Visit: Payer: Self-pay | Admitting: Cardiovascular Disease

## 2022-09-05 ENCOUNTER — Telehealth: Payer: Self-pay | Admitting: Cardiovascular Disease

## 2022-09-05 MED ORDER — ENTRESTO 97-103 MG PO TABS: 1.0000 | ORAL_TABLET | Freq: Two times a day (BID) | ORAL | 1 refills | Status: DC

## 2022-09-05 MED ORDER — CARVEDILOL 6.25 MG PO TABS: 6.2500 mg | ORAL_TABLET | Freq: Two times a day (BID) | ORAL | 1 refills | Status: DC

## 2022-09-05 MED ORDER — CLOPIDOGREL BISULFATE 75 MG PO TABS: 75.0000 mg | ORAL_TABLET | Freq: Every day | ORAL | 1 refills | Status: DC

## 2022-09-05 NOTE — Telephone Encounter (Signed)
*  STAT* If patient is at the pharmacy, call can be transferred to refill team.   1. Which medications need to be refilled? (please list name of each medication and dose if known)   carvedilol (COREG) 6.25 MG tablet  sacubitril-valsartan (ENTRESTO) 97-103 MG  clopidogrel (PLAVIX) 75 MG tablet     2. Would you like to learn more about the convenience, safety, & potential cost savings by using the [facility]?    3. Are you open to using the [facility] (Type [facility].  ).   4. Which pharmacy/location (including street and city if local pharmacy) is medication to be sent to? [id number] - [place], [place] - [address]    5. Do they need a 30 day or 90 day supply? 90 day

## 2022-09-05 NOTE — Telephone Encounter (Signed)
Pt's medications were sent to pt's pharmacy as requested. Confirmation received.  

## 2022-09-13 ENCOUNTER — Ambulatory Visit: Payer: Medicare HMO | Attending: Cardiovascular Disease | Admitting: Cardiovascular Disease

## 2022-09-13 ENCOUNTER — Encounter: Payer: Self-pay | Admitting: Cardiovascular Disease

## 2022-09-13 VITALS — BP 122/70 | HR 65 | Ht 66.0 in | Wt 155.8 lb

## 2022-09-13 DIAGNOSIS — E7800 Pure hypercholesterolemia, unspecified: Secondary | ICD-10-CM

## 2022-09-13 DIAGNOSIS — I2510 Atherosclerotic heart disease of native coronary artery without angina pectoris: Secondary | ICD-10-CM | POA: Diagnosis not present

## 2022-09-13 DIAGNOSIS — Z8679 Personal history of other diseases of the circulatory system: Secondary | ICD-10-CM

## 2022-09-13 DIAGNOSIS — E119 Type 2 diabetes mellitus without complications: Secondary | ICD-10-CM

## 2022-09-13 DIAGNOSIS — I5042 Chronic combined systolic (congestive) and diastolic (congestive) heart failure: Secondary | ICD-10-CM

## 2022-09-13 DIAGNOSIS — Z4509 Encounter for adjustment and management of other cardiac device: Secondary | ICD-10-CM

## 2022-09-13 DIAGNOSIS — I428 Other cardiomyopathies: Secondary | ICD-10-CM | POA: Diagnosis not present

## 2022-09-13 DIAGNOSIS — I10 Essential (primary) hypertension: Secondary | ICD-10-CM

## 2022-09-13 DIAGNOSIS — I493 Ventricular premature depolarization: Secondary | ICD-10-CM

## 2022-09-13 NOTE — Progress Notes (Signed)
Cardiology Office Note:    Date:  09/13/2022   ID:  Justin [name], DOB 1951-01-10, MRN [id number]  PCP:  Etheleen Sia, MD  Cardiologist:  Thurmon Fair, MD   Referring MD: Etheleen Sia, MD   Chief Complaint  Patient presents with   Congestive Heart Failure     History of Present Illness:    Justin [name] is a 72 y.o. 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    He feels well and is very active.  He takes care of all his yard work including using a chainsaw and pressure washing his entire house, without problems or shortness of breath or chest pain.  He does occasionally feel dizzy if he works too long.  He has not had any palpitations and denies syncope.  He falls asleep easily when he sits in a chair, but he does not snore and he can finish watching a game or show on television if he is "into it".  He has not had any chest pain at rest or with activity.  He has not had any new focal neurological events.  Denies claudication, lower extremity edema, orthopnea, PND  PET scan performed in February of this year showed a reduced ejection fraction of 29% with a normal pattern of perfusion and normal myocardial blood flow.  Frequent PVCs were noted.  His loop recorder has reached end of service and he has decided to leave it in.  It never showed atrial fibrillation or other serious arrhythmia.  He complains of frequent urination which she attributes to starting treatment with metformin, although I told him this is unlikely to be the case.  He has difficulty affording the [facility], does not want to start any other expensive medications.  In January 2019 , presented with CVA and he underwent transesophageal echo  which did not show embolic source and had a loop recorder placed.  However the echo evaluation showed evidence of moderate left ventricular systolic dysfunction with an ejection fraction of 35%, mild dilation of the left ventricular chamber and regional wall motion abnormalities with akinesis of the entire distribution of the right coronary artery and grade 2 diastolic dysfunction.  The echo abnormalities were new when compared to a study from 2010. Cath showed CAD and received a DES to proximal RCA March 2019, followed by improvement in LVEF to 40-45%).    Past Medical History:  Diagnosis Date   Abnormal echocardiogram 04/03/2017   CAD S/P percutaneous coronary angioplasty 04/02/17 DES to RCA 04/02/2017   Calcium oxalate renal stones    Chewing tobacco use    Gallstones    hx/o, s/p choleycystectomy   H/O echocardiogram 10/2008   no wall thickness, EF 55%, mild mitral regurge   Hyperlipidemia    Hypertension    hx/o, noncompliance prior with medication   Influenza vaccine refused 09/2013   Rheumatic fever    age 1   Stroke (HCC) 10/02/2008   Tinnitus     Past Surgical History:  Procedure Laterality Date   blood clot  1997   brain   BRAIN SURGERY  1997   craniotomy, evacuation of hematoma   CHOLECYSTECTOMY  09/06/2011   Procedure: LAPAROSCOPIC CHOLECYSTECTOMY WITH INTRAOPERATIVE CHOLANGIOGRAM;  Surgeon: Velora Heckler, MD;  Location: [facility];  Service: General;  Laterality: N/A;   COLONOSCOPY  01/04/14   normal. Dr. Rob Bunting   CORONARY PRESSURE/FFR STUDY N/A 04/02/2017   Procedure: INTRAVASCULAR PRESSURE WIRE/FFR STUDY;  Surgeon: Marykay Lex, MD;  Location: [facility] INVASIVE CV LAB;  Service: Cardiovascular;  Laterality: N/A;   CORONARY STENT INTERVENTION N/A 04/02/2017   Procedure: CORONARY STENT INTERVENTION;  Surgeon: Marykay Lex, MD;  Location: [facility] INVASIVE CV LAB;  Service: Cardiovascular;  Laterality: N/A;   Dental implant     JOINT REPLACEMENT  2004   LEFT BICEP REPAIR, RTC  repair   LEFT HEART CATH AND CORONARY ANGIOGRAPHY N/A 04/02/2017   Procedure: LEFT HEART CATH AND CORONARY ANGIOGRAPHY;  Surgeon: Marykay Lex, MD;  Location: [facility] INVASIVE CV LAB;  Service: Cardiovascular;  Laterality: N/A;   LOOP RECORDER INSERTION N/A 02/27/2017   Procedure: LOOP RECORDER INSERTION;  Surgeon: Thurmon Fair, MD;  Location: [facility] INVASIVE CV LAB;  Service: Cardiovascular;  Laterality: N/A;   LOOP RECORDER INSERTION N/A 02/27/2017   Procedure: LOOP RECORDER INSERTION;  Surgeon: Chrystie Nose, MD;  Location: [facility] ENDOSCOPY;  Service: Cardiovascular;  Laterality: N/A;   ROTATOR CUFF REPAIR     Right   SIGMOIDOSCOPY     TEE WITHOUT CARDIOVERSION N/A 02/27/2017   Procedure: TRANSESOPHAGEAL ECHOCARDIOGRAM (TEE);  Surgeon: Chrystie Nose, MD;  Location: [facility] ENDOSCOPY;  Service: Cardiovascular;  Laterality: N/A;    Current Medications: Current Meds  Medication Sig   aspirin 81 MG chewable tablet Chew 1 tablet (81 mg total) by mouth daily.   atorvastatin (LIPITOR) 40 MG tablet TAKE 1 TABLET BY MOUTH DAILY AT 6 PM.   carvedilol (COREG) 6.25 MG tablet Take 1 tablet (6.25 mg total) by mouth 2 (two) times daily.   clopidogrel (PLAVIX) 75 MG tablet Take 1 tablet (75 mg total) by mouth daily.   metFORMIN (GLUCOPHAGE) 500 MG tablet Take 1 tablet (500 mg total) by mouth 2 (two) times daily with a meal.   nitroGLYCERIN (NITROSTAT) 0.4 MG SL tablet Place 1 tablet (0.4 mg total) under the tongue every 5 (five) minutes as needed for chest pain.   sacubitril-valsartan (ENTRESTO) 97-103 MG Take 1 tablet by mouth 2 (two) times daily.     Allergies:   Patient has no known allergies.   Social History   Socioeconomic History   Marital status: Single    Spouse name: Not on file   Number of children: 2   Years of education: Not on file   Highest education level: Not on file  Occupational History    Employer: [employer]  Tobacco Use   Smoking status: Never   Smokeless tobacco:  Current    Types: Chew  Vaping Use   Vaping status: Never Used  Substance and Sexual Activity   Alcohol use: No    Alcohol/week: 0.0 standard drinks of alcohol   Drug use: No   Sexual activity: Not on file  Other Topics Concern   Not on file  Social History Narrative   Single, has 2 sons, 41yo and 43yo, electrician, walks regularly, 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     Social Determinants of Health   Financial Resource Strain: Low Risk  (04/04/2020)   Received from [facility], [facility]   Overall Financial Resource Strain (CARDIA)    Difficulty of Paying Living Expenses: Not hard at all  Food Insecurity: No Food Insecurity (04/04/2020)   Received from [facility], [facility]   Hunger Vital Sign  Worried About Programme researcher, broadcasting/film/video in the Last Year: Never true    Ran Out of Food in the Last Year: Never true  Transportation Needs: No Transportation Needs (04/04/2020)   Received from [facility], [facility]   PRAPARE - Transportation    Lack of Transportation (Medical): No    Lack of Transportation (Non-Medical): No  Physical Activity: Sufficiently Active (04/04/2020)   Received from [facility], [facility]   Exercise Vital Sign    Days of Exercise per Week: 5 days    Minutes of Exercise per Session: 30 min  Stress: No Stress Concern Present (04/04/2020)   Received from [facility], [facility] - Occupational Stress Questionnaire    Feeling of Stress : Not at all  Social Connections: Unknown (05/31/2021)   Received from [facility], [facility]   Social Network    Social Network: Not on file     Family History: The patient's family history includes Alcohol abuse in his father; Cirrhosis in his brother; Diabetes in his brother; Heart disease (age of onset: 76) in his mother; Migraines in his mother. There is no history of Colon cancer, Cancer, Stroke, Hypertension, or Hyperlipidemia.  ROS:   Please see  the history of present illness.    All other systems are reviewed and are negative.   EKGs/Labs/Other Studies Reviewed:    The following studies were reviewed today:   EKG:  EKG is ordered today and sinus rhythm with occasional PVCs, incomplete left bundle branch block (QRS duration 108 ms).  QTc 447 ms    Recent Labs: 12/28/2021: ALT 13; BUN 14; Creatinine, Ser 0.83; Hemoglobin 13.2; Platelets 195; Potassium 4.7; Sodium 138; TSH 2.090     Latest Ref Rng & Units 12/28/2021   10:55 AM 12/12/2020    9:50 AM 10/07/2019   10:46 AM  BMP  Glucose 70 - 99 mg/dL 563  875  643   BUN 8 - 27 mg/dL 14  13  14    Creatinine 0.76 - 1.27 mg/dL 3.29  5.18  8.41   BUN/Creat Ratio 10 - 24 17  16  16    Sodium 134 - 144 mmol/L 138  138  140   Potassium 3.5 - 5.2 mmol/L 4.7  4.7  4.5   Chloride 96 - 106 mmol/L 103  103  103   CO2 20 - 29 mmol/L 25  25  24    Calcium 8.6 - 10.2 mg/dL 9.0  9.0  9.4      Recent Lipid Panel    Component Value Date/Time   CHOL 127 12/28/2021 1055   TRIG 65 12/28/2021 1055   HDL 49 12/28/2021 1055   CHOLHDL 2.6 12/28/2021 1055   CHOLHDL 5.1 02/26/2017 0328   VLDL 31 02/26/2017 0328   LDLCALC 64 12/28/2021 1055   Jun 16, 2017, [facility] Total cholesterol 109, triglycerides 190, HDL 40, LDL 31 March 17, 2017  hemoglobin A1c 5.7%   December 12, 2020 HgbA1c 8.2%  Physical Exam:    VS:  BP 122/70   Pulse 65   Ht 5\' 6"  (1.676 m)   Wt 155 lb 12.8 oz (70.7 kg)   BMI 25.15 kg/m     Wt Readings from Last 3 Encounters:  09/13/22 155 lb 12.8 oz (70.7 kg)  03/15/22 163 lb (73.9 kg)  12/28/21 161 lb 12.8 oz (73.4 kg)     General: Alert, oriented x3, no distress, appears lean and fit. Head: no  evidence of trauma, PERRL, EOMI, no exophtalmos or lid lag, no myxedema, no xanthelasma; normal ears, nose and oropharynx Neck: normal jugular venous pulsations and no hepatojugular reflux; brisk carotid pulses without delay and no carotid bruits Chest: clear to  auscultation, no signs of consolidation by percussion or palpation, normal fremitus, symmetrical and full respiratory excursions Cardiovascular: normal position and quality of the apical impulse, regular rhythm with occasional ectopic beats, normal first and second heart sounds, no murmurs, rubs or gallops Abdomen: no tenderness or distention, no masses by palpation, no abnormal pulsatility or arterial bruits, normal bowel sounds, no hepatosplenomegaly Extremities: no clubbing, cyanosis or edema; 2+ radial, ulnar and brachial pulses bilaterally; 2+ right femoral, posterior tibial and dorsalis pedis pulses; 2+ left femoral, posterior tibial and dorsalis pedis pulses; no subclavian or femoral bruits Neurological: grossly nonfocal Psych: Normal mood and affect    ASSESSMENT:    1. Chronic combined systolic and diastolic heart failure (HCC)   2. Nonischemic cardiomyopathy (HCC)   3. Coronary artery disease involving native coronary artery of native heart without angina pectoris   4. Essential hypertension   5. Hypercholesterolemia   6. Diabetes mellitus type 2 in nonobese (HCC)   7. History of cerebral embolism   8. 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 Encounter for loop recorder at end of battery life      PLAN:    In order of problems listed above:  CHF:  Functional class I on beta-blockers and Entresto.  NYHA functional class I and euvolemic without diuretics.  Does not want to start any additional medications.  Ideally, would be an SGLT2 inhibitors which would also help with his blood sugar control.  Appears to primarily have nonischemic cardiomyopathy. CAD: Denies angina pectoris and never had angina.  His coronary disease was discovered incidentally during workup for cardiomyopathy.  Recent PET scan shows no evidence of scar or ischemia, normal myocardial blood flow.  Despite this his EF has decreased to 29%. HTN: Well-controlled, has some  occasional dizziness.  I do not think we can add spironolactone and he does not want more medicines anyway. HLP: On statin.  All lipid parameters in target range. DM: Partial improvement in hemoglobin A1c compared to last year, but still little too high.  Likes to eat cereal and Oreo cookies.  On metformin monotherapy.  I think he would be a great candidate for SGLT2 inhibitor such as [place] or [place], if he would agree. Embolic CVA: pattern was strongly suggestive of embolic disease with involvement of multiple vascular territories.  He has made excellent neurological recovery.  No atrial fibrillation has been detected during 3 years of loop recorder monitoring (the device is not end of service).  He is on chronic dual antiplatelet therapy. RVOT PVCs: Asymptomatic, seen on previous ECG tracings, also present today.  They do not appear to be frequent enough to cause PVC cardiomyopathy. Loop recorder at end of service: He has decided against loop explantation.   Medication Adjustments/Labs and Tests Ordered: Current medicines are reviewed at length with the patient today.  Concerns regarding medicines are outlined above.  Orders Placed This Encounter  Procedures   EKG 12-Lead    No orders of the defined types were placed in this encounter.   Patient Instructions  Medication Instructions:  No changes *If you need a refill on your cardiac medications before your next appointment, please call your pharmacy*  Follow-Up: At [facility], you and your health needs are  our priority.  As part of our continuing mission to provide you with exceptional heart care, we have created designated Provider Care Teams.  These Care Teams include your primary Cardiologist (physician) and Advanced Practice Providers (APPs -  Physician Assistants and Nurse Practitioners) who all work together to provide you with the care you need, when you need it.  We recommend signing up for the patient portal  called "MyChart".  Sign up information is provided on this After Visit Summary.  MyChart is used to connect with patients for Virtual Visits (Telemedicine).  Patients are able to view lab/test results, encounter notes, upcoming appointments, etc.  Non-urgent messages can be sent to your provider as well.   To learn more about what you can do with MyChart, go to [email or web address].    Your next appointment:   1 year(s)  Provider:   Thurmon Fair, MD       Signed, Thurmon Fair, MD  09/13/2022 9:34 AM    [facility] Patient

## 2022-09-13 NOTE — Patient Instructions (Signed)

## 2022-11-26 DIAGNOSIS — E119 Type 2 diabetes mellitus without complications: Secondary | ICD-10-CM | POA: Insufficient documentation

## 2022-11-26 DIAGNOSIS — Z7901 Long term (current) use of anticoagulants: Secondary | ICD-10-CM | POA: Insufficient documentation

## 2022-11-26 DIAGNOSIS — I428 Other cardiomyopathies: Secondary | ICD-10-CM | POA: Insufficient documentation

## 2022-12-22 LAB — COLOGUARD: COLOGUARD: POSITIVE — AB

## 2022-12-22 LAB — EXTERNAL GENERIC LAB PROCEDURE: COLOGUARD: POSITIVE — AB

## 2022-12-23 DIAGNOSIS — R195 Other fecal abnormalities: Secondary | ICD-10-CM | POA: Insufficient documentation

## 2023-01-07 ENCOUNTER — Ambulatory Visit (INDEPENDENT_AMBULATORY_CARE_PROVIDER_SITE_OTHER): Payer: Medicare HMO

## 2023-01-07 ENCOUNTER — Ambulatory Visit
Admission: RE | Admit: 2023-01-07 | Discharge: 2023-01-07 | Disposition: A | Discharge status: Home / Self Care | Payer: Medicare HMO | Source: Ambulatory Visit | Attending: Family Medicine | Admitting: Family Medicine

## 2023-01-07 VITALS — BP 114/64 | HR 78 | Temp 98.3°F | Resp 18

## 2023-01-07 DIAGNOSIS — R051 Acute cough: Secondary | ICD-10-CM

## 2023-01-07 DIAGNOSIS — J189 Pneumonia, unspecified organism: Secondary | ICD-10-CM | POA: Diagnosis not present

## 2023-01-07 MED ORDER — HYDROCODONE BIT-HOMATROP MBR 5-1.5 MG/5ML PO SOLN: 2.5000 mL | Freq: Four times a day (QID) | ORAL | 0 refills | Status: AC | PRN

## 2023-01-07 MED ORDER — AZITHROMYCIN 250 MG PO TABS: 250.0000 mg | ORAL_TABLET | Freq: Every day | ORAL | 0 refills | Status: DC

## 2023-01-07 NOTE — Discharge Instructions (Signed)
Be aware, your cough medication may cause drowsiness. Please do not drive, operate heavy machinery or make important decisions while on this medication, it can cloud your judgement.  

## 2023-01-07 NOTE — ED Provider Notes (Signed)
[facility]   [id number] 01/07/23 Arrival Time: 7253  ASSESSMENT & PLAN:  1. Acute cough   2. Community acquired pneumonia of right lower lobe of lung    I have personally viewed and independently interpreted the imaging studies ordered this visit. CXR: suspect early PNA; RLL slight infiltrate.  Begin: Meds ordered this encounter  Medications   HYDROcodone bit-homatropine (HYCODAN) 5-1.5 MG/5ML syrup    Sig: Take 2.5-5 mLs by mouth every 6 (six) hours as needed for cough.    Dispense:  90 mL    Refill:  0   azithromycin (ZITHROMAX) 250 MG tablet    Sig: Take 1 tablet (250 mg total) by mouth daily. Take first 2 tablets together, then 1 every day until finished.    Dispense:  6 tablet    Refill:  0   OTC symptom care as needed.    Discharge Instructions      Be aware, your cough medication may cause drowsiness. Please do not drive, operate heavy machinery or make important decisions while on this medication, it can cloud your judgement.       Follow-up Information     Etheleen Sia, MD.   Specialty: Family Medicine Why: If worsening or failing to improve as anticipated. Contact information: [address] [place] [address] [phone]                 Reviewed expectations re: course of current medical issues. Questions answered. Outlined signs and symptoms indicating need for more acute intervention. Understanding verbalized. After Visit Summary given.   SUBJECTIVE: History from: Patient. Justin [name] is a 72 y.o. male. Reports: cough, chest congestion, nasal congestion; x 1 week; denies SOB/CP. Nyquil with some help. Fatigued. Normal PO intake without n/v/d.  OBJECTIVE:  Vitals:   01/07/23 1018  BP: 114/64  Pulse: 78  Resp: 18  Temp: 98.3 F (36.8 C)  TempSrc: Oral  SpO2: 97%    General appearance: alert; no distress Eyes: PERRLA; EOMI; conjunctiva normal HENT: [place]; AT; with nasal congestion Neck: supple   Lungs: speaks full sentences without difficulty; unlabored; coarse breath sounds bilaterally; active cough Extremities: no edema Skin: warm and dry Neurologic: normal gait Psychological: alert and cooperative; normal mood and affect  No Known Allergies  Past Medical History:  Diagnosis Date   Abnormal echocardiogram 04/03/2017   CAD S/P percutaneous coronary angioplasty 04/02/17 DES to RCA 04/02/2017   Calcium oxalate renal stones    Chewing tobacco use    Gallstones    hx/o, s/p choleycystectomy   H/O echocardiogram 10/2008   no wall thickness, EF 55%, mild mitral regurge   Hyperlipidemia    Hypertension    hx/o, noncompliance prior with medication   Influenza vaccine refused 09/2013   Rheumatic fever    age 48   Stroke (HCC) 10/02/2008   Tinnitus    Social History   Socioeconomic History   Marital status: Single    Spouse name: Not on file   Number of children: 2   Years of education: Not on file   Highest education level: Not on file  Occupational History    Employer: [employer]  Tobacco Use   Smoking status: Never   Smokeless tobacco: Current    Types: Chew  Vaping Use   Vaping status: Never Used  Substance and Sexual Activity   Alcohol use: No    Alcohol/week: 0.0 standard drinks of alcohol   Drug use: No   Sexual activity: Not  on file  Other Topics Concern   Not on file  Social History Narrative   Single, has 2 sons, 41yo and 43yo, electrician, walks regularly, 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     Social Determinants of Health   Financial Resource Strain: Low Risk  (11/26/2022)   Received from [facility]   Overall Financial Resource Strain (CARDIA)    Difficulty of Paying Living Expenses: Not hard at all  Food Insecurity: No Food Insecurity (11/26/2022)   Received from [facility]   Hunger Vital Sign    Worried About Running Out of Food in the Last Year: Never true    Ran Out of Food in the Last Year: Never true  Transportation Needs:  No Transportation Needs (11/26/2022)   Received from [facility] - Transportation    Lack of Transportation (Medical): No    Lack of Transportation (Non-Medical): No  Physical Activity: Sufficiently Active (11/26/2022)   Received from [facility]   Exercise Vital Sign    Days of Exercise per Week: 5 days    Minutes of Exercise per Session: 120 min  Stress: No Stress Concern Present (11/26/2022)   Received from [facility] - Occupational Stress Questionnaire    Feeling of Stress : Only a little  Social Connections: Socially Integrated (11/26/2022)   Received from [facility]   Social Network    How would you rate your social network (family, work, friends)?: Good participation with social networks  Intimate Partner Violence: Not At Risk (11/26/2022)   Received from [facility]   HITS    Over the last 12 months how often did your partner physically hurt you?: Never    Over the last 12 months how often did your partner insult you or talk down to you?: Never    Over the last 12 months how often did your partner threaten you with physical harm?: Never    Over the last 12 months how often did your partner scream or curse at you?: Never   Family History  Problem Relation Age of Onset   Heart disease Mother 67       MI   Migraines Mother    Cirrhosis Brother    Alcohol abuse Father        lived to age 66   Diabetes Brother    Colon cancer Neg Hx    Cancer Neg Hx    Stroke Neg Hx    Hypertension Neg Hx    Hyperlipidemia Neg Hx    Past Surgical History:  Procedure Laterality Date   blood clot  1997   brain   BRAIN SURGERY  1997   craniotomy, evacuation of hematoma   CHOLECYSTECTOMY  09/06/2011   Procedure: LAPAROSCOPIC CHOLECYSTECTOMY WITH INTRAOPERATIVE CHOLANGIOGRAM;  Surgeon: Velora Heckler, MD;  Location: [facility];  Service: General;  Laterality: N/A;   COLONOSCOPY  01/04/14   normal. Dr. Rob Bunting   CORONARY  PRESSURE/FFR STUDY N/A 04/02/2017   Procedure: INTRAVASCULAR PRESSURE WIRE/FFR STUDY;  Surgeon: Marykay Lex, MD;  Location: [facility] INVASIVE CV LAB;  Service: Cardiovascular;  Laterality: N/A;   CORONARY STENT INTERVENTION N/A 04/02/2017   Procedure: CORONARY STENT INTERVENTION;  Surgeon: Marykay Lex, MD;  Location: [facility] INVASIVE CV LAB;  Service: Cardiovascular;  Laterality: N/A;   Dental implant     JOINT REPLACEMENT  2004   LEFT BICEP REPAIR, RTC repair   LEFT HEART CATH AND CORONARY  ANGIOGRAPHY N/A 04/02/2017   Procedure: LEFT HEART CATH AND CORONARY ANGIOGRAPHY;  Surgeon: Marykay Lex, MD;  Location: [facility] INVASIVE CV LAB;  Service: Cardiovascular;  Laterality: N/A;   LOOP RECORDER INSERTION N/A 02/27/2017   Procedure: LOOP RECORDER INSERTION;  Surgeon: Thurmon Fair, MD;  Location: [facility] INVASIVE CV LAB;  Service: Cardiovascular;  Laterality: N/A;   LOOP RECORDER INSERTION N/A 02/27/2017   Procedure: LOOP RECORDER INSERTION;  Surgeon: Chrystie Nose, MD;  Location: [facility] ENDOSCOPY;  Service: Cardiovascular;  Laterality: N/A;   ROTATOR CUFF REPAIR     Right   SIGMOIDOSCOPY     TEE WITHOUT CARDIOVERSION N/A 02/27/2017   Procedure: TRANSESOPHAGEAL ECHOCARDIOGRAM (TEE);  Surgeon: Chrystie Nose, MD;  Location: [facility] ENDOSCOPY;  Service: Cardiovascular;  Laterality: N/AMardella Layman, MD 01/07/23 1401

## 2023-01-07 NOTE — ED Triage Notes (Signed)
Pt c/o cough, fever, congestion x 5 days. Pt has taken OTC Coricidin D and Nyquil.

## 2023-02-05 ENCOUNTER — Telehealth: Payer: Self-pay | Admitting: Cardiovascular Disease

## 2023-02-05 NOTE — Telephone Encounter (Signed)
 Called and spoke to patient's wife. Verified name and DOB. She report that patient's A1C was 7.2 when they went to his PCP. His PCP wanted to add Farxiga. She said when they went to pick up the Theoplis it was $189 because it wasn't covered by his insurance. She would like recommendations for replacement Dr Francyne and would like him to manage the medication because he knows which medication will not interfere with his heart medication. Please advise.

## 2023-02-05 NOTE — Telephone Encounter (Signed)
 Pt c/o medication issue:  1. Name of Medication:  metFORMIN  (GLUCOPHAGE ) 500 MG tablet  2. How are you currently taking this medication (dosage and times per day)?   3. Are you having a reaction (difficulty breathing--STAT)?   4. What is your medication issue?   Patient called in and handed his wife the phone. Patient's wife states patient had labs with PCP on 11/26/22 at A1C was elevated at 7.2 while on Metformin . She states PCP recommended going on Farxiga, but it wasn't covered by insurance. Patient's wife would like recommendations from Dr. Francyne.

## 2023-02-09 NOTE — Telephone Encounter (Signed)
 I am ok w prescribing his Jardiance. Please send it in.

## 2023-02-10 ENCOUNTER — Other Ambulatory Visit (HOSPITAL_COMMUNITY): Payer: Self-pay

## 2023-02-10 ENCOUNTER — Telehealth: Payer: Self-pay | Admitting: Pharmacy Technician

## 2023-02-10 MED ORDER — EMPAGLIFLOZIN 10 MG PO TABS: 10.0000 mg | ORAL_TABLET | Freq: Every day | ORAL | 11 refills | Status: DC

## 2023-02-10 NOTE — Telephone Encounter (Signed)
 Left message with call back number.  Dr Francyne said he is ok with prescribing Jardiance  (preferred over Farxiga by his insurance). Sent in prescription for Jardiance  10 mg daily, this will take the place of the Farxiga. Sent in a Prior Authorization for the Jardiance  as well.

## 2023-02-10 NOTE — Telephone Encounter (Signed)
 Pharmacy Patient Advocate Encounter   Received notification from Physician's Office that prior authorization for Jardiance  is required/requested.   Insurance verification completed.   The patient is insured through [employer] .   Per test claim: Refill too soon. PA is not needed at this time. Medication was filled 02/10/23. Next eligible fill date is 03/05/23.  Deductible

## 2023-02-10 NOTE — Addendum Note (Signed)
 Addended by: Scheryl Marten on: 02/10/2023 02:43 PM   Modules accepted: Orders

## 2023-02-11 ENCOUNTER — Telehealth: Payer: Self-pay | Admitting: Cardiovascular Disease

## 2023-02-11 NOTE — Telephone Encounter (Signed)
 Patient identification verified by 2 forms. Marilynn Rail, RN    Called and spoke to patient  Relayed message below  Advised patient to check with pharmacy regarding pick up  Patient verbalized understanding, no questions at this time

## 2023-02-11 NOTE — Telephone Encounter (Signed)
 Please see 1/8 telephone encounter for documentation

## 2023-02-11 NOTE — Telephone Encounter (Signed)
 Patient returned RN Katie's call.

## 2023-02-12 NOTE — Telephone Encounter (Signed)
 Lynett Sarah, CPhT  Blanchie Zeleznik L, RN Replies will be sent to [facility] [employer] not needed at this time. Patient has a deductible to meet before going to regular copay

## 2023-03-07 ENCOUNTER — Other Ambulatory Visit: Payer: Self-pay | Admitting: Cardiovascular Disease

## 2023-03-11 ENCOUNTER — Other Ambulatory Visit: Payer: Self-pay | Admitting: Cardiovascular Disease

## 2023-03-14 ENCOUNTER — Ambulatory Visit
Admission: RE | Admit: 2023-03-14 | Discharge: 2023-03-14 | Disposition: A | Discharge status: Home / Self Care | Payer: Medicare HMO | Source: Ambulatory Visit

## 2023-03-14 VITALS — BP 116/79 | HR 67 | Temp 97.9°F | Resp 20 | Ht 66.0 in | Wt 155.9 lb

## 2023-03-14 DIAGNOSIS — J40 Bronchitis, not specified as acute or chronic: Secondary | ICD-10-CM

## 2023-03-14 DIAGNOSIS — J329 Chronic sinusitis, unspecified: Secondary | ICD-10-CM

## 2023-03-14 MED ORDER — AMOXICILLIN-POT CLAVULANATE 875-125 MG PO TABS: 1.0000 | ORAL_TABLET | Freq: Two times a day (BID) | ORAL | 0 refills | Status: DC

## 2023-03-14 MED ORDER — PREDNISONE 20 MG PO TABS: 40.0000 mg | ORAL_TABLET | Freq: Every day | ORAL | 0 refills | Status: AC

## 2023-03-14 NOTE — ED Triage Notes (Signed)
"  Started with Cough over a month ago but just hasn't gotten better, the previous antibiotics made it better about 3 days but now persistent again, I think I have Bronchitis". No wheezing. No sob. No fever.

## 2023-03-19 NOTE — ED Provider Notes (Signed)
[facility]    CSN: [id number] Arrival date & time: 03/14/23  1826      History   Chief Complaint Chief Complaint  Patient presents with   Cough    HPI Justin [name] is a 73 y.o. male.   Patient here today for evaluation of a cough that he has had for over a month.  He reports he is not improving despite antibiotics.  He denies any wheezing or shortness of breath.  He has not had fever.  The history is provided by the patient.  Cough Associated symptoms: no chills, no ear pain, no eye discharge, no fever, no shortness of breath and no sore throat     Past Medical History:  Diagnosis Date   Abnormal echocardiogram 04/03/2017   CAD S/P percutaneous coronary angioplasty 04/02/17 DES to RCA 04/02/2017   Calcium oxalate renal stones    Chewing tobacco use    Gallstones    hx/o, s/p choleycystectomy   H/O echocardiogram 10/2008   no wall thickness, EF 55%, mild mitral regurge   Hyperlipidemia    Hypertension    hx/o, noncompliance prior with medication   Influenza vaccine refused 09/2013   Rheumatic fever    age 5   Stroke (HCC) 10/02/2008   Tinnitus     Patient Active Problem List   Diagnosis Date Noted   Positive colorectal cancer screening using Cologuard test 12/23/2022   Type 2 diabetes mellitus (HCC) 11/26/2022   Anticoagulant long-term use 11/26/2022   Nonischemic cardiomyopathy (HCC) 11/26/2022   Elbow pain, chronic, left 04/05/2019   Hypercholesterolemia 12/11/2017   Coronary artery disease involving native coronary artery of native heart without angina pectoris 12/11/2017   Ischemic cardiomyopathy 04/10/2017   Abnormal echocardiogram 04/03/2017   CAD S/P percutaneous coronary angioplasty  04/02/2017   Coronary artery disease involving native heart without angina pectoris 03/17/2017   History of arterial ischemic stroke 03/17/2017   Cognitive deficit, post-stroke    Diabetes mellitus type 2 in nonobese (HCC)    Left basal ganglia embolic stroke  (HCC) 02/27/2017   Gait disturbance, post-stroke    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)    Diabetes type 2, controlled (HCC) 02/26/2017   Acute ischemic stroke (HCC) 02/26/2017   Dilated cardiomyopathy (HCC)    Chronic combined systolic and diastolic heart failure (HCC)    Benign essential HTN    Prediabetes    History of traumatic brain injury    History of CVA (cerebrovascular accident)    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HCC)    Medicare annual wellness visit, subsequent 04/01/2016   Impaired fasting blood sugar 04/01/2016   Vaccine refused by patient 04/01/2016   Cramping of feet 04/01/2016   History of kidney stones 02/24/2015   Noncompliance 02/24/2015   Mixed hyperlipidemia 02/24/2015   Essential hypertension 02/24/2015   Routine general medical examination at a health care facility 02/24/2015   Corn of foot 02/24/2015   Paresthesia of left foot 02/24/2015   Advanced directives, counseling/discussion 02/24/2015   Vaccine counseling 02/24/2015   Tobacco chew use 05/06/2011   Acute right-sided weakness 10/02/2008   Microcytosis 10/02/2008   TIA (transient ischemic attack) 10/02/2008    Past Surgical History:  Procedure Laterality Date   blood clot  1997   brain   BRAIN SURGERY  1997   craniotomy, evacuation of hematoma   CHOLECYSTECTOMY  09/06/2011   Procedure: LAPAROSCOPIC CHOLECYSTECTOMY WITH INTRAOPERATIVE CHOLANGIOGRAM;  Surgeon: Velora Heckler, MD;  Location: [facility];  Service: General;  Laterality: N/A;   COLONOSCOPY  01/04/14   normal. Dr. Rob Bunting   CORONARY PRESSURE/FFR STUDY N/A 04/02/2017   Procedure: INTRAVASCULAR PRESSURE WIRE/FFR STUDY;  Surgeon: Marykay Lex, MD;  Location: [facility] INVASIVE CV LAB;  Service: Cardiovascular;  Laterality: N/A;   CORONARY STENT INTERVENTION N/A 04/02/2017   Procedure: CORONARY STENT INTERVENTION;  Surgeon: Marykay Lex, MD;  Location: [facility] INVASIVE CV LAB;  Service:  Cardiovascular;  Laterality: N/A;   Dental implant     JOINT REPLACEMENT  2004   LEFT BICEP REPAIR, RTC repair   LEFT HEART CATH AND CORONARY ANGIOGRAPHY N/A 04/02/2017   Procedure: LEFT HEART CATH AND CORONARY ANGIOGRAPHY;  Surgeon: Marykay Lex, MD;  Location: [facility] INVASIVE CV LAB;  Service: Cardiovascular;  Laterality: N/A;   LOOP RECORDER INSERTION N/A 02/27/2017   Procedure: LOOP RECORDER INSERTION;  Surgeon: Thurmon Fair, MD;  Location: [facility] INVASIVE CV LAB;  Service: Cardiovascular;  Laterality: N/A;   LOOP RECORDER INSERTION N/A 02/27/2017   Procedure: LOOP RECORDER INSERTION;  Surgeon: Chrystie Nose, MD;  Location: [facility] ENDOSCOPY;  Service: Cardiovascular;  Laterality: N/A;   ROTATOR CUFF REPAIR     Right   SIGMOIDOSCOPY     TEE WITHOUT CARDIOVERSION N/A 02/27/2017   Procedure: TRANSESOPHAGEAL ECHOCARDIOGRAM (TEE);  Surgeon: Chrystie Nose, MD;  Location: [facility] ENDOSCOPY;  Service: Cardiovascular;  Laterality: N/A;       Home Medications    Prior to Admission medications   Medication Sig Start Date End Date Taking? Authorizing Provider  amoxicillin-clavulanate (AUGMENTIN) 875-125 MG tablet Take 1 tablet by mouth every 12 (twelve) hours. 03/14/23  Yes Tomi Bamberger, PA-C  aspirin EC 81 MG tablet Take 81 mg by mouth daily.   Yes [provider]  atorvastatin (LIPITOR) 40 MG tablet TAKE 1 TABLET BY MOUTH DAILY AT 6 PM. 03/07/23  Yes Croitoru, Mihai, MD  carvedilol (COREG) 6.25 MG tablet TAKE 1 TABLET BY MOUTH TWICE A DAY 03/11/23  Yes Croitoru, Mihai, MD  clopidogrel (PLAVIX) 75 MG tablet Take 1 tablet (75 mg total) by mouth daily. 09/05/22  Yes Croitoru, Mihai, MD  metFORMIN (GLUCOPHAGE) 500 MG tablet TAKE 1 TABLET BY MOUTH 2 TIMES DAILY WITH A MEAL. 03/07/23  Yes Croitoru, Mihai, MD  predniSONE (DELTASONE) 20 MG tablet Take 2 tablets (40 mg total) by mouth daily with breakfast for 5 days. 03/14/23 03/19/23 Yes Tomi Bamberger, PA-C  dapagliflozin propanediol (FARXIGA) 5 MG  TABS tablet Take 5 mg by mouth daily. 11/26/22   [provider]  empagliflozin (JARDIANCE) 10 MG TABS tablet Take 1 tablet (10 mg total) by mouth daily before breakfast. 02/10/23   Croitoru, Mihai, MD  HYDROcodone bit-homatropine (HYCODAN) 5-1.5 MG/5ML syrup Take 2.5-5 mLs by mouth every 6 (six) hours as needed for cough. 01/07/23   Mardella Layman, MD  nitroGLYCERIN (NITROSTAT) 0.4 MG SL tablet Place 1 tablet (0.4 mg total) under the tongue every 5 (five) minutes as needed for chest pain. 04/03/17   Leone Brand, NP  sacubitril-valsartan (ENTRESTO) 97-103 MG TAKE 1 TABLET BY MOUTH TWICE A DAY 03/07/23   Croitoru, Mihai, MD  sacubitril-valsartan (ENTRESTO) 97-103 MG Take 1 tablet by mouth 2 (two) times daily.    [provider]    Family History Family History  Problem Relation Age of Onset   Heart disease Mother 15       MI   Migraines Mother    Cirrhosis Brother  Alcohol abuse Father        lived to age 26   Diabetes Brother    Colon cancer Neg Hx    Cancer Neg Hx    Stroke Neg Hx    Hypertension Neg Hx    Hyperlipidemia Neg Hx     Social History Social History   Tobacco Use   Smoking status: Never   Smokeless tobacco: Current    Types: Chew  Vaping Use   Vaping status: Never Used  Substance Use Topics   Alcohol use: No    Alcohol/week: 0.0 standard drinks of alcohol   Drug use: No     Allergies   Patient has no known allergies.   Review of Systems Review of Systems  Constitutional:  Negative for chills and fever.  HENT:  Positive for congestion. Negative for ear pain and sore throat.   Eyes:  Negative for discharge and redness.  Respiratory:  Positive for cough. Negative for shortness of breath.   Gastrointestinal:  Negative for abdominal pain, nausea and vomiting.     Physical Exam Triage Vital Signs ED Triage Vitals  Encounter Vitals Group     BP 03/14/23 1842 116/79     Systolic BP Percentile --      Diastolic BP Percentile --       Pulse Rate 03/14/23 1842 67     Resp 03/14/23 1842 20     Temp 03/14/23 1842 97.9 F (36.6 C)     Temp Source 03/14/23 1842 Oral     SpO2 03/14/23 1842 96 %     Weight 03/14/23 1839 155 lb 13.8 oz (70.7 kg)     Height 03/14/23 1839 5\' 6"  (1.676 m)     Head Circumference --      Peak Flow --      Pain Score 03/14/23 1839 0     Pain Loc --      Pain Education --      Exclude from Growth Chart --    No data found.  Updated Vital Signs BP 116/79 (BP Location: Left Arm)   Pulse 67   Temp 97.9 F (36.6 C) (Oral)   Resp 20   Ht 5\' 6"  (1.676 m)   Wt 155 lb 13.8 oz (70.7 kg)   SpO2 96%   BMI 25.16 kg/m   Visual Acuity Right Eye Distance:   Left Eye Distance:   Bilateral Distance:    Right Eye Near:   Left Eye Near:    Bilateral Near:     Physical Exam Vitals and nursing note reviewed.  Constitutional:      General: He is not in acute distress.    Appearance: Normal appearance. He is not ill-appearing.  HENT:     Head: Normocephalic and atraumatic.     Right Ear: Tympanic membrane normal.     Left Ear: Tympanic membrane normal.     Nose: Nose normal. No congestion.     Mouth/Throat:     Mouth: Mucous membranes are moist.     Pharynx: Oropharynx is clear. No oropharyngeal exudate or posterior oropharyngeal erythema.  Eyes:     Conjunctiva/sclera: Conjunctivae normal.  Cardiovascular:     Rate and Rhythm: Normal rate and regular rhythm.     Heart sounds: Normal heart sounds. No murmur heard. Pulmonary:     Effort: Pulmonary effort is normal. No respiratory distress.     Breath sounds: Normal breath sounds. No wheezing, rhonchi or rales.  Skin:  General: Skin is warm and dry.  Neurological:     Mental Status: He is alert.  Psychiatric:        Mood and Affect: Mood normal.        Thought Content: Thought content normal.      UC Treatments / Results  Labs (all labs ordered are listed, but only abnormal results are displayed) Labs Reviewed - No data to  display  EKG   Radiology No results found.  Procedures Procedures (including critical care time)  Medications Ordered in UC Medications - No data to display  Initial Impression / Assessment and Plan / UC Course  I have reviewed the triage vital signs and the nursing notes.  Pertinent labs & imaging results that were available during my care of the patient were reviewed by me and considered in my medical decision making (see chart for details).    Sinobronchitis suspected.  Will treat with Augmentin and steroid burst.  Advised follow-up if no gradual improvement with any further concerns.  Final Clinical Impressions(s) / UC Diagnoses   Final diagnoses:  Sinobronchitis   Discharge Instructions   None    ED Prescriptions     Medication Sig Dispense Auth. Provider   amoxicillin-clavulanate (AUGMENTIN) 875-125 MG tablet Take 1 tablet by mouth every 12 (twelve) hours. 14 tablet Erma Pinto F, PA-C   predniSONE (DELTASONE) 20 MG tablet Take 2 tablets (40 mg total) by mouth daily with breakfast for 5 days. 10 tablet Tomi Bamberger, PA-C      PDMP not reviewed this encounter.   Tomi Bamberger, PA-C 03/19/23 [phone]

## 2023-06-05 ENCOUNTER — Other Ambulatory Visit: Payer: Self-pay | Admitting: Cardiovascular Disease

## 2023-07-10 ENCOUNTER — Telehealth: Payer: Self-pay

## 2023-07-10 ENCOUNTER — Telehealth: Payer: Self-pay | Admitting: Cardiovascular Disease

## 2023-07-10 NOTE — Telephone Encounter (Signed)
 Med Rec and consent done     Patient Consent for Virtual Visit        Justin [name] has provided verbal consent on 07/10/2023 for a virtual visit (video or telephone).   CONSENT FOR VIRTUAL VISIT FOR:  Justin [name]  By participating in this virtual visit I agree to the following:  I hereby voluntarily request, consent and authorize [facility] and its employed or contracted physicians, physician assistants, nurse practitioners or other licensed health care professionals (the Practitioner), to provide me with telemedicine health care services (the "Services) as deemed necessary by the treating Practitioner. I acknowledge and consent to receive the Services by the Practitioner via telemedicine. I understand that the telemedicine visit will involve communicating with the Practitioner through live audiovisual communication technology and the disclosure of certain medical information by electronic transmission. I acknowledge that I have been given the opportunity to request an in-person assessment or other available alternative prior to the telemedicine visit and am voluntarily participating in the telemedicine visit.  I understand that I have the right to withhold or withdraw my consent to the use of telemedicine in the course of my care at any time, without affecting my right to future care or treatment, and that the Practitioner or I may terminate the telemedicine visit at any time. I understand that I have the right to inspect all information obtained and/or recorded in the course of the telemedicine visit and may receive copies of available information for a reasonable fee.  I understand that some of the potential risks of receiving the Services via telemedicine include:  Delay or interruption in medical evaluation due to technological equipment failure or disruption; Information transmitted may not be sufficient (e.g. poor resolution of images) to allow for appropriate medical  decision making by the Practitioner; and/or  In rare instances, security protocols could fail, causing a breach of personal health information.  Furthermore, I acknowledge that it is my responsibility to provide information about my medical history, conditions and care that is complete and accurate to the best of my ability. I acknowledge that Practitioner's advice, recommendations, and/or decision may be based on factors not within their control, such as incomplete or inaccurate data provided by me or distortions of diagnostic images or specimens that may result from electronic transmissions. I understand that the practice of medicine is not an exact science and that Practitioner makes no warranties or guarantees regarding treatment outcomes. I acknowledge that a copy of this consent can be made available to me via my patient portal [facility] MyChart), or I can request a printed copy by calling the office of [facility].    I understand that my insurance will be billed for this visit.   I have read or had this consent read to me. I understand the contents of this consent, which adequately explains the benefits and risks of the Services being provided via telemedicine.  I have been provided ample opportunity to ask questions regarding this consent and the Services and have had my questions answered to my satisfaction. I give my informed consent for the services to be provided through the use of telemedicine in my medical care

## 2023-07-10 NOTE — Telephone Encounter (Signed)
 Primary Cardiologist:Mihai Croitoru, MD   Preoperative team, please contact this patient and set up a phone call appointment for further preoperative risk assessment. Please obtain consent and complete medication review. Thank you for your help.   I confirm that guidance regarding antiplatelet and oral anticoagulation therapy has been completed and, if necessary, noted below.  Per office protocol, he may hold Plavix  for 5 days prior to procedure and should resume as soon as hemodynamically stable postoperatively.  Ideally aspirin  should be continued without interruption, however if the bleeding risk is too great, aspirin  may be held for 5-7 days prior to surgery. Please resume aspirin  post operatively when it is felt to be safe from a bleeding standpoint.   I also confirmed the patient resides in the state of [place] . As per [facility] Medical Board telemedicine laws, the patient must reside in the state in which the provider is licensed.   Gerldine Koch, NP-C  07/10/2023, 4:00 PM [address], [address] [place], [place] [address] Office [phone] Fax [phone]

## 2023-07-10 NOTE — Telephone Encounter (Signed)
   Pre-operative Risk Assessment    Patient Name: Justin [name]  DOB: 1950-07-26 MRN: [id number]   Date of last office visit: 02/11/23 Date of next office visit: n/a   Request for Surgical Clearance    Procedure:  Colonoscopy   Date of Surgery:  Clearance 07/24/23                                Surgeon:  Dr, Terry Ficks  Surgeon's Group or Practice Name:  [facility]  Phone number:  [phone] Fax number:  [phone]   Type of Clearance Requested:   - Medical  - Pharmacy:  Hold        Type of Anesthesia:  MAC (could be General if necessary)   Additional requests/questions:     SignedCaryn Clause   07/10/2023, 12:24 PM

## 2023-07-10 NOTE — Telephone Encounter (Signed)
 S/W pt and scheduled TELE Preop appt 07/16/23. Med Rec and consent done

## 2023-07-16 ENCOUNTER — Ambulatory Visit: Attending: Cardiology | Admitting: Student

## 2023-07-16 DIAGNOSIS — Z01810 Encounter for preprocedural cardiovascular examination: Secondary | ICD-10-CM | POA: Diagnosis not present

## 2023-07-16 NOTE — Progress Notes (Signed)
 Virtual Visit via Telephone Note   Because of [name]'s co-morbid illnesses, he is at least at moderate risk for complications without adequate follow up.  This format is felt to be most appropriate for this patient at this time.  The patient did not have access to video technology/had technical difficulties with video requiring transitioning to audio format only (telephone).  All issues noted in this document were discussed and addressed.  No physical exam could be performed with this format.  Please refer to the patient's chart for his consent to telehealth for [facility].  Evaluation Performed:  Preoperative cardiovascular risk assessment _____________   Date:  07/16/2023   Patient ID:  Justin [name], DOB 1950-09-04, MRN [id number] Patient Location:  Home Provider location:   Office  Primary Care Provider:  Severo Danker, MD Primary Cardiologist:  Luana Rumple, MD  Chief Complaint / Patient Profile   73 y.o. y/o male with a h/o CAD s/p DES to RCA March 2019, chronic combined systolic and diastolic heart failure, brainstem stroke January 2019, hypertension, hyperlipidemia, T2DM who is pending colonoscopy by Dr. Terry Ficks on 07/24/2023 and presents today for telephonic preoperative cardiovascular risk assessment.  History of Present Illness    Justin [name] is a 73 y.o. male who presents via audio/video conferencing for a telehealth visit today.  Pt was last seen in cardiology clinic on 09/13/2022 by Dr. Alvis Ba.  At that time Justin [name] was stable from a cardiac standpoint.  The patient is now pending procedure as outlined above. Since his last visit, he is doing well. Patient denies shortness of breath, dyspnea on exertion, lower extremity edema, orthopnea or PND. No chest pain, pressure, or tightness. No palpitations.  He is very active fishing, doing yard work, and other light to heavy tasks around his home. He states I can do whatever I want to do  without limitations.   Past Medical History    Past Medical History:  Diagnosis Date   Abnormal echocardiogram 04/03/2017   CAD S/P percutaneous coronary angioplasty 04/02/17 DES to RCA 04/02/2017   Calcium  oxalate renal stones    Chewing tobacco use    Gallstones    hx/o, s/p choleycystectomy   H/O echocardiogram 10/2008   no wall thickness, EF 55%, mild mitral regurge   Hyperlipidemia    Hypertension    hx/o, noncompliance prior with medication   Influenza vaccine refused 09/2013   Rheumatic fever    age 89   Stroke (HCC) 10/02/2008   Tinnitus    Past Surgical History:  Procedure Laterality Date   blood clot  1997   brain   BRAIN SURGERY  1997   craniotomy, evacuation of hematoma   CHOLECYSTECTOMY  09/06/2011   Procedure: LAPAROSCOPIC CHOLECYSTECTOMY WITH INTRAOPERATIVE CHOLANGIOGRAM;  Surgeon: Keitha Pata, MD;  Location: [facility];  Service: General;  Laterality: N/A;   COLONOSCOPY  01/04/14   normal. Dr. Hoyt Macleod   CORONARY PRESSURE/FFR STUDY N/A 04/02/2017   Procedure: INTRAVASCULAR PRESSURE WIRE/FFR STUDY;  Surgeon: Arleen Lacer, MD;  Location: [facility] INVASIVE CV LAB;  Service: Cardiovascular;  Laterality: N/A;   CORONARY STENT INTERVENTION N/A 04/02/2017   Procedure: CORONARY STENT INTERVENTION;  Surgeon: Arleen Lacer, MD;  Location: [facility]VAS[facility] CV LAB;  Service: Cardiovascular;  Laterality: N/A;   Dental implant     JOINT REPLACEMENT  2004   LEFT BICEP REPAIR, RTC repair   LEFT HEART CATH AND CORONARY ANGIOGRAPHY N/A 04/02/2017   Procedure: LEFT  HEART CATH AND CORONARY ANGIOGRAPHY;  Surgeon: Arleen Lacer, MD;  Location: [facility] INVASIVE CV LAB;  Service: Cardiovascular;  Laterality: N/A;   LOOP RECORDER INSERTION N/A 02/27/2017   Procedure: LOOP RECORDER INSERTION;  Surgeon: Luana Rumple, MD;  Location: [facility] INVASIVE CV LAB;  Service: Cardiovascular;  Laterality: N/A;   LOOP RECORDER INSERTION N/A 02/27/2017   Procedure: LOOP RECORDER INSERTION;  Surgeon: Hazle Lites, MD;   Location: [facility] ENDOSCOPY;  Service: Cardiovascular;  Laterality: N/A;   ROTATOR CUFF REPAIR     Right   SIGMOIDOSCOPY     TEE WITHOUT CARDIOVERSION N/A 02/27/2017   Procedure: TRANSESOPHAGEAL ECHOCARDIOGRAM (TEE);  Surgeon: Hazle Lites, MD;  Location: [facility] ENDOSCOPY;  Service: Cardiovascular;  Laterality: N/A;    Allergies  No Known Allergies  Home Medications    Prior to Admission medications   Medication Sig Start Date End Date Taking? Authorizing Provider  amoxicillin -clavulanate (AUGMENTIN ) 875-125 MG tablet Take 1 tablet by mouth every 12 (twelve) hours. 03/14/23   Vernestine Gondola, PA-C  aspirin  EC 81 MG tablet Take 81 mg by mouth daily.    [provider]  atorvastatin  (LIPITOR) 40 MG tablet TAKE 1 TABLET BY MOUTH DAILY AT 6 PM. 03/07/23   Croitoru, Mihai, MD  carvedilol  (COREG ) 6.25 MG tablet TAKE 1 TABLET BY MOUTH TWICE A DAY 03/11/23   Croitoru, Mihai, MD  clopidogrel  (PLAVIX ) 75 MG tablet TAKE 1 TABLET BY MOUTH EVERY DAY 06/05/23   Croitoru, Mihai, MD  dapagliflozin propanediol (FARXIGA) 5 MG TABS tablet Take 5 mg by mouth daily. 11/26/22   [provider]  empagliflozin  (JARDIANCE ) 10 MG TABS tablet Take 1 tablet (10 mg total) by mouth daily before breakfast. 02/10/23   Croitoru, Mihai, MD  HYDROcodone  bit-homatropine (HYCODAN) 5-1.5 MG/5ML syrup Take 2.5-5 mLs by mouth every 6 (six) hours as needed for cough. 01/07/23   Afton Albright, MD  metFORMIN  (GLUCOPHAGE ) 500 MG tablet TAKE 1 TABLET BY MOUTH 2 TIMES DAILY WITH A MEAL. 03/07/23   Croitoru, Mihai, MD  nitroGLYCERIN  (NITROSTAT ) 0.4 MG SL tablet Place 1 tablet (0.4 mg total) under the tongue every 5 (five) minutes as needed for chest pain. 04/03/17   Onetha Bile, NP  sacubitril -valsartan  (ENTRESTO ) 97-103 MG TAKE 1 TABLET BY MOUTH TWICE A DAY 03/07/23   Croitoru, Mihai, MD  sacubitril -valsartan  (ENTRESTO ) 97-103 MG Take 1 tablet by mouth 2 (two) times daily.    [provider]    Physical Exam     Vital Signs:  Justin [name] does not have vital signs available for review today.  Given telephonic nature of communication, physical exam is limited. AAOx3. NAD. Normal affect.  Speech and respirations are unlabored.   Assessment & Plan    Primary Cardiologist: Luana Rumple, MD  Preoperative cardiovascular risk assessment.  Colonoscopy by Dr. Terry Ficks on 07/24/2023.  Chart reviewed as part of pre-operative protocol coverage. According to the RCRI, patient has a 11-15% risk of MACE. Patient reports activity equivalent to >4.0 METS (fishing, yard work, light to heavy household activities).   Given past medical history and time since last visit, based on ACC/AHA guidelines, Justin [name] would be at acceptable risk for the planned procedure without further cardiovascular testing.   Patient was advised that if he develops new symptoms prior to surgery to contact our office to arrange a follow-up appointment.  he verbalized understanding.  Per office protocol, he may hold Plavix  for 5 days prior to procedure and should resume as  soon as hemodynamically stable postoperatively. Patient will continue aspirin  81 mg daily throughout peri-procedural period.      I will route this recommendation to the requesting party via Epic fax function.  Please call with questions.  Time:   Today, I have spent 6 minutes with the patient with telehealth technology discussing medical history, symptoms, and management plan.     Morey Ar, NP  07/16/2023, 7:56 AM

## 2023-09-05 ENCOUNTER — Other Ambulatory Visit: Payer: Self-pay | Admitting: Cardiovascular Disease

## 2023-12-10 ENCOUNTER — Other Ambulatory Visit: Payer: Self-pay | Admitting: Cardiovascular Disease

## 2023-12-22 ENCOUNTER — Telehealth: Payer: Self-pay | Admitting: Cardiovascular Disease

## 2023-12-22 MED ORDER — CLOPIDOGREL BISULFATE 75 MG PO TABS: 75.0000 mg | ORAL_TABLET | Freq: Every day | ORAL | 0 refills | Status: DC

## 2023-12-22 NOTE — Telephone Encounter (Signed)
*  STAT* If patient is at the pharmacy, call can be transferred to refill team.   1. Which medications need to be refilled? (please list name of each medication and dose if known)   clopidogrel  (PLAVIX ) 75 MG tablet     2. Would you like to learn more about the convenience, safety, & potential cost savings by using the [facility]? No    3. Are you open to using the [facility] (Type [facility]. No    4. Which pharmacy/location (including street and city if local pharmacy) is medication to be sent to? [id number] - [place], [place] - [address]     5. Do they need a 30 day or 90 day supply? 90 day   Pt has appt scheduled 12/11.

## 2023-12-22 NOTE — Telephone Encounter (Signed)
 Pt's medication was sent to pt's pharmacy as requested. Confirmation received.

## 2024-01-08 ENCOUNTER — Ambulatory Visit: Attending: Internal Medicine | Admitting: Cardiovascular Disease

## 2024-01-08 ENCOUNTER — Encounter: Payer: Self-pay | Admitting: Cardiovascular Disease

## 2024-01-08 VITALS — BP 122/62 | HR 70 | Ht 66.0 in | Wt 155.0 lb

## 2024-01-08 DIAGNOSIS — I10 Essential (primary) hypertension: Secondary | ICD-10-CM

## 2024-01-08 DIAGNOSIS — I428 Other cardiomyopathies: Secondary | ICD-10-CM | POA: Diagnosis not present

## 2024-01-08 DIAGNOSIS — I2510 Atherosclerotic heart disease of native coronary artery without angina pectoris: Secondary | ICD-10-CM

## 2024-01-08 DIAGNOSIS — E7800 Pure hypercholesterolemia, unspecified: Secondary | ICD-10-CM

## 2024-01-08 DIAGNOSIS — E119 Type 2 diabetes mellitus without complications: Secondary | ICD-10-CM

## 2024-01-08 DIAGNOSIS — I5042 Chronic combined systolic (congestive) and diastolic (congestive) heart failure: Secondary | ICD-10-CM

## 2024-01-08 DIAGNOSIS — I493 Ventricular premature depolarization: Secondary | ICD-10-CM | POA: Diagnosis not present

## 2024-01-08 MED ORDER — CARVEDILOL 6.25 MG PO TABS: 6.2500 mg | ORAL_TABLET | Freq: Two times a day (BID) | ORAL | 3 refills | Status: AC

## 2024-01-08 NOTE — Progress Notes (Signed)
 Cardiology Office Note:    Date:  01/10/2024   ID:  Justin [name], DOB [birth year], MRN [id number]  PCP:  Shona Hamilton, MD  Cardiologist:  Jerel Balding, MD   Referring MD: Shona Hamilton, MD   Chief Complaint  Patient presents with   Congestive Heart Failure   Coronary Artery Disease     History of Present Illness:    Justin [name] is a 73 y.o. 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    Continues to feel well and is almost asymptomatic from a cardiovascular point of view.  He lives getting down on the floor to play with his great-grandchildren.  He does not feel limited by shortness of breath or chest pain, but does feel short of breath when climbing a hill.  Occasionally feels lightheaded when he stands up quickly, but he has not had severe episodes of dizziness or syncope.  Denies palpitations.  Has not lower extremity edema or claudication.  His most recent echo from December 2023 showed LVEF 25-30% with inferior and inferolateral basal akinesi.  s PET scan performed in February 2024 showed a reduced ejection fraction of 29% (that did not augment with stress) with a normal pattern of perfusion and normal myocardial blood flow (MBF reserve was 3.0).  Frequent PVCs were noted.  His loop recorder has reached end of service and he has decided to leave it in.  It never showed atrial fibrillation or other serious arrhythmia.  He is currently on treatment with maximum tolerated doses of carvedilol  (6.25 mg twice daily) and sacubitril /valsartan  97-103 mg twice daily.  He is also taking Farxiga in a reduced dose of 5 mg daily.  He is not on spironolactone as blood pressure would not allow this.  Today his  diastolic blood pressure is 54 mmHg.  Metabolic profile is good with labs in August 2025 showing hemoglobin A1c 0.94, total cholesterol 111, HDL 50, LDL 48, triglycerides 59.  He has normal renal function with a creatinine of 0.94 and potassium was 4.0.  In January 2019 , presented with CVA and he underwent transesophageal echo which did not show embolic source and had a loop recorder placed.  However the echo evaluation showed evidence of moderate left ventricular systolic dysfunction with an ejection fraction of 35%, mild dilation of the left ventricular chamber and regional wall motion abnormalities with akinesis of the entire distribution of the right coronary artery and grade 2 diastolic dysfunction.  The echo abnormalities were new when compared to a study from 2010. Cath showed CAD and received a DES to proximal RCA March 2019, followed by improvement in LVEF to 40-45%).    Past Medical History:  Diagnosis Date   Abnormal echocardiogram 04/03/2017   CAD S/P percutaneous coronary angioplasty 04/02/17 DES to RCA 04/02/2017   Calcium  oxalate renal stones    Chewing tobacco use    Gallstones    hx/o, s/p choleycystectomy   H/O echocardiogram 10/2008   no wall thickness, EF 55%, mild mitral regurge   Hyperlipidemia    Hypertension    hx/o, noncompliance prior with medication   Influenza vaccine refused 09/2013   Rheumatic fever    age 61   Stroke (HCC) 10/02/2008   Tinnitus     Past Surgical History:  Procedure Laterality Date   blood clot  1997   brain   BRAIN SURGERY  1997   craniotomy, evacuation of hematoma   CHOLECYSTECTOMY  09/06/2011   Procedure: LAPAROSCOPIC CHOLECYSTECTOMY WITH INTRAOPERATIVE CHOLANGIOGRAM;  Surgeon: Krystal CHRISTELLA Spinner, MD;  Location: [facility];  Service: General;  Laterality: N/A;   COLONOSCOPY  01/04/14   normal. Dr. Toribio Cedar   CORONARY PRESSURE/FFR STUDY N/A 04/02/2017   Procedure: INTRAVASCULAR PRESSURE WIRE/FFR STUDY;  Surgeon: Anner Alm ORN, MD;  Location: [facility]  INVASIVE CV LAB;  Service: Cardiovascular;  Laterality: N/A;   CORONARY STENT INTERVENTION N/A 04/02/2017   Procedure: CORONARY STENT INTERVENTION;  Surgeon: Anner Alm ORN, MD;  Location: [facility] INVASIVE CV LAB;  Service: Cardiovascular;  Laterality: N/A;   Dental implant     JOINT REPLACEMENT  2004   LEFT BICEP REPAIR, RTC repair   LEFT HEART CATH AND CORONARY ANGIOGRAPHY N/A 04/02/2017   Procedure: LEFT HEART CATH AND CORONARY ANGIOGRAPHY;  Surgeon: Anner Alm ORN, MD;  Location: [facility] INVASIVE CV LAB;  Service: Cardiovascular;  Laterality: N/A;   LOOP RECORDER INSERTION N/A 02/27/2017   Procedure: LOOP RECORDER INSERTION;  Surgeon: Francyne Headland, MD;  Location: [facility] INVASIVE CV LAB;  Service: Cardiovascular;  Laterality: N/A;   LOOP RECORDER INSERTION N/A 02/27/2017   Procedure: LOOP RECORDER INSERTION;  Surgeon: Mona Vinie BROCKS, MD;  Location: [facility] ENDOSCOPY;  Service: Cardiovascular;  Laterality: N/A;   ROTATOR CUFF REPAIR     Right   SIGMOIDOSCOPY     TEE WITHOUT CARDIOVERSION N/A 02/27/2017   Procedure: TRANSESOPHAGEAL ECHOCARDIOGRAM (TEE);  Surgeon: Mona Vinie BROCKS, MD;  Location: [facility] ENDOSCOPY;  Service: Cardiovascular;  Laterality: N/A;    Current Medications: Current Meds  Medication Sig   aspirin  EC 81 MG tablet Take 81 mg by mouth daily.   atorvastatin  (LIPITOR) 40 MG tablet TAKE 1 TABLET BY MOUTH DAILY AT 6 PM.   clopidogrel  (PLAVIX ) 75 MG tablet Take 1 tablet (75 mg total) by mouth daily.   dapagliflozin propanediol (FARXIGA) 5 MG TABS tablet Take 5 mg by mouth daily.   HYDROcodone  bit-homatropine (HYCODAN) 5-1.5 MG/5ML syrup Take 2.5-5 mLs by mouth every 6 (six) hours as needed for cough.   metFORMIN  (GLUCOPHAGE ) 500 MG tablet TAKE 1 TABLET BY MOUTH 2 TIMES DAILY WITH A MEAL.   sacubitril -valsartan  (ENTRESTO ) 97-103 MG TAKE 1 TABLET BY MOUTH TWICE A DAY   [DISCONTINUED] carvedilol  (COREG ) 6.25 MG tablet TAKE 1 TABLET BY MOUTH TWICE A DAY     Allergies:   Patient has no known  allergies.   Social History   Socioeconomic History   Marital status: Single    Spouse name: Not on file   Number of children: 2   Years of education: Not on file   Highest education level: Not on file  Occupational History    Employer: [employer]  Tobacco Use   Smoking status: Never   Smokeless tobacco: Current    Types: Chew  Vaping Use   Vaping status: Never Used  Substance and Sexual Activity   Alcohol use: No    Alcohol/week: 0.0 standard drinks of alcohol   Drug use: No   Sexual activity: Not Currently  Other Topics Concern   Not on file  Social History Narrative   Single, has 2 sons, 41yo and 43yo, electrician, walks regularly, 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     Social Drivers of Health   Tobacco Use: High Risk (01/08/2024)   Patient History  Smoking Tobacco Use: Never    Smokeless Tobacco Use: Current    Passive Exposure: Not on file  Financial Resource Strain: Low Risk (09/09/2023)   Received from [facility]   Overall Financial Resource Strain (CARDIA)    How hard is it for you to pay for the very basics like food, housing, medical care, and heating?: Not hard at all  Food Insecurity: No Food Insecurity (09/09/2023)   Received from [facility]   Epic    Within the past 12 months, you worried that your food would run out before you got the money to buy more.: Never true    Within the past 12 months, the food you bought just didn't last and you didn't have money to get more.: Never true  Transportation Needs: No Transportation Needs (09/09/2023)   Received from [facility]    In the past 12 months, has lack of transportation kept you from medical appointments or from getting medications?: No    In the past 12 months, has lack of transportation kept you from meetings, work, or from getting things needed for daily living?: No  Physical Activity: Insufficiently Active (09/09/2023)   Received from [facility]   Exercise Vital Sign     On average, how many days per week do you engage in moderate to strenuous exercise (like a brisk walk)?: 4 days    On average, how many minutes do you engage in exercise at this level?: 20 min  Stress: No Stress Concern Present (09/09/2023)   Received from [facility] - Occupational Stress Questionnaire    Do you feel stress - tense, restless, nervous, or anxious, or unable to sleep at night because your mind is troubled all the time - these days?: Not at all  Social Connections: Socially Integrated (09/09/2023)   Received from [facility]   Social Network    How would you rate your social network (family, work, friends)?: Good participation with social networks  Depression (PHQ2-9): Not on file  Alcohol Screen: Not on file  Housing: Low Risk (09/09/2023)   Received from [facility]    In the last 12 months, was there a time when you were not able to pay the mortgage or rent on time?: No    In the past 12 months, how many times have you moved where you were living?: 0    At any time in the past 12 months, were you homeless or living in a shelter (including now)?: No  Utilities: Not At Risk (09/09/2023)   Received from [facility]    In the past 12 months has the electric, gas, oil, or water company threatened to shut off services in your home?: No  Health Literacy: Not on file     Family History: The patient's family history includes Alcohol abuse in his father; Cirrhosis in his brother; Diabetes in his brother; Heart disease (age of onset: 72) in his mother; Migraines in his mother. There is no history of Colon cancer, Cancer, Stroke, Hypertension, or Hyperlipidemia.  ROS:   Please see the history of present illness.    All other systems are reviewed and are negative.   EKGs/Labs/Other Studies Reviewed:    The following studies were reviewed today:   EKG:    EKG Interpretation Date/Time:  Thursday January 08 2024 16:45:23 EST Ventricular Rate:  70 PR Interval:    QRS Duration:  138 QT Interval:  428 QTC Calculation: 462 R Axis:   79  Text Interpretation: sinus rhythm with ventricular bigeminy Non-specific intra-ventricular conduction block T wave abnormality, consider inferior ischemia When compared with ECG of 13-Sep-2022 07:57, PVCs are more frequent Confirmed by Skylene Deremer [phone]) on 01/08/2024 5:02:27 PM          Recent Labs: No results found for requested labs within last 365 days.     Latest Ref Rng & Units 12/28/2021   10:55 AM 12/12/2020    9:50 AM 10/07/2019   10:46 AM  BMP  Glucose 70 - 99 mg/dL 871  851  875   BUN 8 - 27 mg/dL 14  13  14    Creatinine 0.76 - 1.27 mg/dL 9.16  9.16  9.11   BUN/Creat Ratio 10 - 24 17  16  16    Sodium 134 - 144 mmol/L 138  138  140   Potassium 3.5 - 5.2 mmol/L 4.7  4.7  4.5   Chloride 96 - 106 mmol/L 103  103  103   CO2 20 - 29 mmol/L 25  25  24    Calcium  8.6 - 10.2 mg/dL 9.0  9.0  9.4      Recent Lipid Panel    Component Value Date/Time   CHOL 127 12/28/2021 1055   TRIG 65 12/28/2021 1055   HDL 49 12/28/2021 1055   CHOLHDL 2.6 12/28/2021 1055   CHOLHDL 5.1 02/26/2017 0328   VLDL 31 02/26/2017 0328   LDLCALC 64 12/28/2021 1055   09/09/2023 hemoglobin A1c 0.94, total cholesterol 111, HDL 50, LDL 48, triglycerides 59 creatinine 0.94,potassium 4.0.  Physical Exam:    VS:  BP 122/62   Pulse 70   Ht 5' 6 (1.676 m)   Wt 155 lb (70.3 kg)   SpO2 98%   BMI 25.02 kg/m     Wt Readings from Last 3 Encounters:  01/08/24 155 lb (70.3 kg)  03/14/23 155 lb 13.8 oz (70.7 kg)  09/13/22 155 lb 12.8 oz (70.7 kg)     General: Alert, oriented x3, no distress, appears lean and fit Head: no evidence of trauma, PERRL, EOMI, no exophtalmos or lid lag, no myxedema, no xanthelasma; normal ears, nose and oropharynx Neck: normal jugular venous pulsations and no hepatojugular reflux; brisk carotid pulses without delay and no carotid  bruits Chest: clear to auscultation, no signs of consolidation by percussion or palpation, normal fremitus, symmetrical and full respiratory excursions Cardiovascular: normal position and quality of the apical impulse, regular rhythm, normal first and second heart sounds, no murmurs, rubs or gallops Abdomen: no tenderness or distention, no masses by palpation, no abnormal pulsatility or arterial bruits, normal bowel sounds, no hepatosplenomegaly Extremities: no clubbing, cyanosis or edema; 2+ radial, ulnar and brachial pulses bilaterally; 2+ right femoral, posterior tibial and dorsalis pedis pulses; 2+ left femoral, posterior tibial and dorsalis pedis pulses; no subclavian or femoral bruits Neurological: grossly nonfocal Psych: Normal mood and affect     ASSESSMENT:    1. Chronic combined systolic and diastolic heart failure (HCC)   2. Nonischemic cardiomyopathy (HCC)   3. Coronary artery disease involving native coronary artery of native heart without angina pectoris   4. Essential hypertension   5. Hypercholesterolemia   6. Diabetes type 2, controlled (HCC)   7. 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  Blood pressure does not allow additional heart failure medications.  LV function is severely depressed.  We initially thought we saw some improvement in LV function after revascularization of the right coronary artery, but his EF is pretty much where we started around 30%. I would not mind if his dapagliflozin is increased to the 10 mg dose, even though he has good glycemic control, but he is averse to making any medical regimen changes since he is doing well.  We reviewed the importance of uninterrupted beta-blocker therapy to avoid  rebound heart failure/rebound arrhythmia/rebound hypertension. CAD: He has never complained of angina pectoris and his coronary problems were discovered incidentally during the workup for cardiomyopathy.  Recent PET scan shows no evidence of scar or ischemia, normal myocardial blood flow.   HTN: On the current medications he has occasional orthostatic dizziness and his diastolic blood pressure is quite low.  There is no room to add spironolactone.   HLP: All lipids are in excellent range, continue statin. DM: Improved glycemic control with hemoglobin A1c in target range.  He is on low doses of dapagliflozin and metformin . Embolic CVA: No recurrent neurological events.  The 2019 stroke pattern was strongly suggestive of embolic disease with involvement of multiple vascular territories.  He has made excellent neurological recovery.  No atrial fibrillation has been detected during 3 years of loop recorder monitoring (the device is not end of service).  He is on chronic dual antiplatelet therapy. 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  Asymptomatic, seen on previous ECG tracings, also present today.  During loop recorder monitor PVCs were frequently seen, but they did not appear to be frequent enough to cause PVC cardiomyopathy.  Today he presents with bigeminy.  He is not aware of the arrhythmia.  Not sure there is a cause or effect connection with his cardiomyopathy or if they are a totally separate issue Loop recorder at end of service: He has decided against loop explantation.   Medication Adjustments/Labs and Tests Ordered: Current medicines are reviewed at length with the patient today.  Concerns regarding medicines are outlined above.  Orders Placed This Encounter  Procedures   EKG 12-Lead    Meds ordered this encounter  Medications   carvedilol  (COREG ) 6.25 MG tablet    Sig: Take 1 tablet (6.25 mg total) by mouth  2 (two) times daily.    Dispense:  180 tablet    Refill:  3    Patient Instructions  Medication Instructions:  No changes *If you need a refill on your cardiac medications before your next appointment, please call your pharmacy*  Lab Work: None ordered If you have labs (blood work) drawn today and your tests are completely normal, you will receive your results only by: MyChart Message (if you have MyChart) OR A paper copy in the mail If you have any lab test that is abnormal or we need to change your treatment, we will call you to review the results.  Testing/Procedures: None ordered  Follow-Up: At [facility], you and your health needs are our priority.  As part of our continuing mission to provide you with exceptional heart care, our providers are all part of one team.  This team includes your primary Cardiologist (physician) and Advanced Practice Providers or APPs (Physician Assistants and Nurse Practitioners) who all work together to provide you with  the care you need, when you need it.  Your next appointment:   1 year(s)  Provider:   Jerel Balding, MD    We recommend signing up for the patient portal called MyChart.  Sign up information is provided on this After Visit Summary.  MyChart is used to connect with patients for Virtual Visits (Telemedicine).  Patients are able to view lab/test results, encounter notes, upcoming appointments, etc.  Non-urgent messages can be sent to your provider as well.   To learn more about what you can do with MyChart, go to [email or web address].      Signed, Jerel Balding, MD  01/10/2024 9:40 AM    [facility] Patient

## 2024-01-08 NOTE — Patient Instructions (Signed)

## 2024-01-10 DIAGNOSIS — I493 Ventricular premature depolarization: Secondary | ICD-10-CM | POA: Insufficient documentation

## 2024-02-01 ENCOUNTER — Other Ambulatory Visit: Payer: Self-pay | Admitting: Cardiovascular Disease
